# Patient Record
Sex: Female | Born: 1990 | Race: White | Hispanic: No | Marital: Married | Smoking: Never smoker
Health system: Southern US, Community
[De-identification: ages and names within clinical notes are randomized; demographics above are authoritative.]

## PROBLEM LIST (undated history)

## (undated) DIAGNOSIS — F988 Other specified behavioral and emotional disorders with onset usually occurring in childhood and adolescence: Secondary | ICD-10-CM

## (undated) DIAGNOSIS — E559 Vitamin D deficiency, unspecified: Secondary | ICD-10-CM

## (undated) HISTORY — PX: BREAST SURGERY: SHX581

## (undated) HISTORY — DX: Vitamin D deficiency, unspecified: E55.9

## (undated) HISTORY — DX: Other specified behavioral and emotional disorders with onset usually occurring in childhood and adolescence: F98.8

---
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
## 2009-04-19 ENCOUNTER — Emergency Department (HOSPITAL_COMMUNITY): Admission: EM | Admit: 2009-04-19 | Discharge: 2009-04-19 | Payer: Self-pay | Admitting: Emergency Medicine

## 2010-02-14 ENCOUNTER — Ambulatory Visit: Payer: Self-pay | Admitting: Internal Medicine

## 2010-08-30 ENCOUNTER — Ambulatory Visit: Payer: Self-pay | Admitting: Internal Medicine

## 2011-03-13 LAB — POCT PREGNANCY, URINE: Preg Test, Ur: NEGATIVE

## 2011-03-19 LAB — POCT PREGNANCY, URINE: Preg Test, Ur: NEGATIVE

## 2011-03-29 ENCOUNTER — Other Ambulatory Visit: Payer: Self-pay

## 2011-04-16 ENCOUNTER — Ambulatory Visit: Payer: Self-pay | Admitting: General Surgery

## 2011-08-29 LAB — POCT PREGNANCY, URINE: Preg Test, Ur: NEGATIVE

## 2011-08-29 LAB — URINALYSIS, ROUTINE W REFLEX MICROSCOPIC
Bilirubin Urine: NEGATIVE
Nitrite: NEGATIVE
Specific Gravity, Urine: 1.016
Urobilinogen, UA: 0.2

## 2011-08-29 LAB — URINE MICROSCOPIC-ADD ON

## 2011-08-29 LAB — URINE CULTURE: Colony Count: >=100000

## 2012-01-16 ENCOUNTER — Ambulatory Visit: Payer: Self-pay

## 2013-02-02 ENCOUNTER — Ambulatory Visit: Payer: Self-pay | Admitting: Internal Medicine

## 2017-04-03 ENCOUNTER — Other Ambulatory Visit: Payer: Self-pay | Admitting: Obstetrics and Gynecology

## 2017-04-03 DIAGNOSIS — N63 Unspecified lump in unspecified breast: Secondary | ICD-10-CM

## 2017-05-03 ENCOUNTER — Ambulatory Visit
Admission: RE | Admit: 2017-05-03 | Discharge: 2017-05-03 | Disposition: A | Discharge status: Home / Self Care | Payer: BC Managed Care – PPO | Source: Ambulatory Visit | Attending: Obstetrics and Gynecology | Admitting: Obstetrics and Gynecology

## 2017-05-03 DIAGNOSIS — N6310 Unspecified lump in the right breast, unspecified quadrant: Secondary | ICD-10-CM | POA: Insufficient documentation

## 2017-05-03 DIAGNOSIS — N63 Unspecified lump in unspecified breast: Secondary | ICD-10-CM

## 2017-12-03 NOTE — L&D Delivery Note (Signed)
Delivery Note:  [id number] Dr Logan BoresEvans in room for bedside evaluation. Recommended pitocin augmentation or vacuum assisted birth.   Effective maternal pushing efforts noted prior to intervention and pushing resumed. Local anesthesia to perineum in preparation for possible episiotomy. Episiotomy deferred.   Spontaneous vaginal delivery of liveborn female patient in left occiput anterior position at 0145. Infant immediately to maternal abdomen. Delayed cord clamping. Three (3) vessel cord, cord blood collected. APGARs: 7, 9. Weight: pending. Receiving nurse and NNP present at bedside for birth.   Pitocin infusing. Spontaneous delivery of intact placenta at 0151. Second degree perineal laceration repaired with 3-0 Vicryl Rapide under local anesthesia, hemostatic and well approximated. Left labial laceration, hemostatic unrepaired. QBL: 328 ml. Vault check completed. Counts correct x 2.   Initiate routine postpartum care and orders. Mom to postpartum.  Baby to Couplet care / Skin to Skin.  FOB and family members present at bedside and overjoyed with the birth of "Tobi Bastosnna".    Gunnar BullaJenkins Michelle Flor Houdeshell, CNM [facility], [facility] 07/15/2018, 2:25 AM

## 2017-12-04 ENCOUNTER — Other Ambulatory Visit: Payer: Self-pay

## 2017-12-04 DIAGNOSIS — B3731 Acute candidiasis of vulva and vagina: Secondary | ICD-10-CM

## 2017-12-04 DIAGNOSIS — B373 Candidiasis of vulva and vagina: Secondary | ICD-10-CM | POA: Insufficient documentation

## 2017-12-04 DIAGNOSIS — N926 Irregular menstruation, unspecified: Secondary | ICD-10-CM

## 2017-12-04 DIAGNOSIS — R300 Dysuria: Secondary | ICD-10-CM | POA: Insufficient documentation

## 2017-12-04 DIAGNOSIS — F901 Attention-deficit hyperactivity disorder, predominantly hyperactive type: Secondary | ICD-10-CM

## 2017-12-05 ENCOUNTER — Other Ambulatory Visit: Payer: Self-pay | Admitting: Nurse Practitioner

## 2017-12-09 ENCOUNTER — Ambulatory Visit: Payer: BC Managed Care – PPO | Admitting: Certified Nurse Midwife

## 2017-12-09 ENCOUNTER — Encounter: Payer: Self-pay | Admitting: Certified Nurse Midwife

## 2017-12-09 VITALS — BP 109/70 | HR 95 | Ht 60.0 in | Wt 118.5 lb

## 2017-12-09 DIAGNOSIS — N912 Amenorrhea, unspecified: Secondary | ICD-10-CM | POA: Diagnosis not present

## 2017-12-09 DIAGNOSIS — N926 Irregular menstruation, unspecified: Secondary | ICD-10-CM | POA: Diagnosis not present

## 2017-12-09 DIAGNOSIS — Z3687 Encounter for antenatal screening for uncertain dates: Secondary | ICD-10-CM | POA: Diagnosis not present

## 2017-12-09 LAB — POCT URINE PREGNANCY: Preg Test, Ur: POSITIVE — AB

## 2017-12-09 NOTE — Patient Instructions (Signed)
WHAT OB PATIENTS CAN EXPECT   Confirmation of pregnancy and ultrasound ordered if medically indicated-[redacted] weeks gestation  New OB (NOB) intake with nurse and New OB (NOB) labs- [redacted] weeks gestation  New OB (NOB) physical examination with provider- 11/[redacted] weeks gestation  Flu vaccine-[redacted] weeks gestation  Anatomy scan-[redacted] weeks gestation  Glucose tolerance test, blood work to test for anemia, T-dap vaccine-[redacted] weeks gestation  Vaginal swabs/cultures-STD/Group B strep-[redacted] weeks gestation  Appointments every 4 weeks until 28 weeks  Every 2 weeks from 28 weeks until 36 weeks  Weekly visits from 36 weeks until delivery  Eating Plan for Pregnant Women While you are pregnant, your body will require additional nutrition to help support your growing baby. It is recommended that you consume:  150 additional calories each day during your first trimester.  300 additional calories each day during your second trimester.  300 additional calories each day during your third trimester.  Eating a healthy, well-balanced diet is very important for your health and for your baby's health. You also have a higher need for some vitamins and minerals, such as folic acid, calcium, iron, and vitamin D. What do I need to know about eating during pregnancy?  Do not try to lose weight or go on a diet during pregnancy.  Choose healthy, nutritious foods. Choose  of a sandwich with a glass of milk instead of a candy bar or a high-calorie sugar-sweetened beverage.  Limit your overall intake of foods that have "empty calories." These are foods that have little nutritional value, such as sweets, desserts, candies, sugar-sweetened beverages, and fried foods.  Eat a variety of foods, especially fruits and vegetables.  Take a prenatal vitamin to help meet the additional needs during pregnancy, specifically for folic acid, iron, calcium, and vitamin D.  Remember to stay active. Ask your health care provider for exercise  recommendations that are specific to you.  Practice good food safety and cleanliness, such as washing your hands before you eat and after you prepare raw meat. This helps to prevent foodborne illnesses, such as listeriosis, that can be very dangerous for your baby. Ask your health care provider for more information about listeriosis. What does 150 extra calories look like? Healthy options for an additional 150 calories each day could be any of the following:  Plain low-fat yogurt (6-8 oz) with  cup of berries.  1 apple with 2 teaspoons of peanut butter.  Cut-up vegetables with  cup of hummus.  Low-fat chocolate milk (8 oz or 1 cup).  1 string cheese with 1 medium orange.   of a peanut butter and jelly sandwich on whole-wheat bread (1 tsp of peanut butter).  For 300 calories, you could eat two of those healthy options each day. What is a healthy amount of weight to gain? The recommended amount of weight for you to gain is based on your pre-pregnancy BMI. If your pre-pregnancy BMI was:  Less than 18 (underweight), you should gain 28-40 lb.  18-24.9 (normal), you should gain 25-35 lb.  25-29.9 (overweight), you should gain 15-25 lb.  Greater than 30 (obese), you should gain 11-20 lb.  What if I am having twins or multiples? Generally, pregnant women who will be having twins or multiples may need to increase their daily calories by 300-600 calories each day. The recommended range for total weight gain is 25-54 lb, depending on your pre-pregnancy BMI. Talk with your health care provider for specific guidance about additional nutritional needs, weight gain, and exercise during  your pregnancy. What foods can I eat? Grains Any grains. Try to choose whole grains, such as whole-wheat bread, oatmeal, or brown rice. Vegetables Any vegetables. Try to eat a variety of colors and types of vegetables to get a full range of vitamins and minerals. Remember to wash your vegetables well before  eating. Fruits Any fruits. Try to eat a variety of colors and types of fruit to get a full range of vitamins and minerals. Remember to wash your fruits well before eating. Meats and Other Protein Sources Lean meats, including chicken, [place], fish, and lean cuts of beef, veal, or pork. Make sure that all meats are cooked to "well done." Tofu. Tempeh. Beans. Eggs. Peanut butter and other nut butters. Seafood, such as shrimp, crab, and lobster. If you choose fish, select types that are higher in omega-3 fatty acids, including salmon, herring, mussels, trout, sardines, and pollock. Make sure that all meats are cooked to food-safe temperatures. Dairy Pasteurized milk and milk alternatives. Pasteurized yogurt and pasteurized cheese. Cottage cheese. Sour cream. Beverages Water. Juices that contain 100% fruit juice or vegetable juice. Caffeine-free teas and decaffeinated coffee. Drinks that contain caffeine are okay to drink, but it is better to avoid caffeine. Keep your total caffeine intake to less than 200 mg each day (12 oz of coffee, tea, or soda) or as directed by your health care provider. Condiments Any pasteurized condiments. Sweets and Desserts Any sweets and desserts. Fats and Oils Any fats and oils. The items listed above may not be a complete list of recommended foods or beverages. Contact your dietitian for more options. What foods are not recommended? Vegetables Unpasteurized (raw) vegetable juices. Fruits Unpasteurized (raw) fruit juices. Meats and Other Protein Sources Cured meats that have nitrates, such as bacon, salami, and hotdogs. Luncheon meats, bologna, or other deli meats (unless they are reheated until they are steaming hot). Refrigerated pate, meat spreads from a meat counter, smoked seafood that is found in the refrigerated section of a store. Raw fish, such as sushi or sashimi. High mercury content fish, such as tilefish, shark, swordfish, and king mackerel. Raw meats,  such as tuna or beef tartare. Undercooked meats and poultry. Make sure that all meats are cooked to food-safe temperatures. Dairy Unpasteurized (raw) milk and any foods that have raw milk in them. Soft cheeses, such as feta, queso blanco, queso fresco, Brie, Camembert cheeses, blue-veined cheeses, and Panela cheese (unless it is made with pasteurized milk, which must be stated on the label). Beverages Alcohol. Sugar-sweetened beverages, such as sodas, teas, or energy drinks. Condiments Homemade fermented foods and drinks, such as pickles, sauerkraut, or kombucha drinks. (Store-bought pasteurized versions of these are okay.) Other Salads that are made in the store, such as ham salad, chicken salad, egg salad, tuna salad, and seafood salad. The items listed above may not be a complete list of foods and beverages to avoid. Contact your dietitian for more information. This information is not intended to replace advice given to you by your health care provider. Make sure you discuss any questions you have with your health care provider. Document Released: 09/03/2014 Document Revised: 04/26/2016 Document Reviewed: 05/04/2014 Elsevier Interactive Patient Education  2018 [place]. Back Pain in Pregnancy Back pain during pregnancy is common. Back pain may be caused by several factors that are related to changes during your pregnancy. Follow these instructions at home: Managing pain, stiffness, and swelling  If directed, apply ice for sudden (acute) back pain. ? Put ice in a plastic  bag. ? Place a towel between your skin and the bag. ? Leave the ice on for 20 minutes, 2-3 times per day.  If directed, apply heat to the affected area before you exercise: ? Place a towel between your skin and the heat pack or heating pad. ? Leave the heat on for 20-30 minutes. ? Remove the heat if your skin turns bright red. This is especially important if you are unable to feel pain, heat, or cold. You may have a  greater risk of getting burned. Activity  Exercise as told by your health care provider. Exercising is the best way to prevent or manage back pain.  Listen to your body when lifting. If lifting hurts, ask for help or bend your knees. This uses your leg muscles instead of your back muscles.  Squat down when picking up something from the floor. Do not bend over.  Only use bed rest as told by your health care provider. Bed rest should only be used for the most severe episodes of back pain. Standing, Sitting, and Lying Down  Do not stand in one place for long periods of time.  Use good posture when sitting. Make sure your head rests over your shoulders and is not hanging forward. Use a pillow on your lower back if necessary.  Try sleeping on your side, preferably the left side, with a pillow or two between your legs. If you are sore after a night's rest, your bed may be too soft. A firm mattress may provide more support for your back during pregnancy. General instructions  Do not wear high heels.  Eat a healthy diet. Try to gain weight within your health care provider's recommendations.  Use a maternity girdle, elastic sling, or back brace as told by your health care provider.  Take over-the-counter and prescription medicines only as told by your health care provider.  Keep all follow-up visits as told by your health care provider. This is important. This includes any visits with any specialists, such as a physical therapist. Contact a health care provider if:  Your back pain interferes with your daily activities.  You have increasing pain in other parts of your body. Get help right away if:  You develop numbness, tingling, weakness, or problems with the use of your arms or legs.  You develop severe back pain that is not controlled with medicine.  You have a sudden change in bowel or bladder control.  You develop shortness of breath, dizziness, or you faint.  You develop  nausea, vomiting, or sweating.  You have back pain that is a rhythmic, cramping pain similar to labor pains. Labor pain is usually 1-2 minutes apart, lasts for about 1 minute, and involves a bearing down feeling or pressure in your pelvis.  You have back pain and your water breaks or you have vaginal bleeding.  You have back pain or numbness that travels down your leg.  Your back pain developed after you fell.  You develop pain on one side of your back.  You see blood in your urine.  You develop skin blisters in the area of your back pain. This information is not intended to replace advice given to you by your health care provider. Make sure you discuss any questions you have with your health care provider. Document Released: 02/27/2006 Document Revised: 04/26/2016 Document Reviewed: 08/03/2015 Elsevier Interactive Patient Education  2018 [place]. Abdominal Pain During Pregnancy Belly (abdominal) pain is common during pregnancy. Most of the time, it  is not a serious problem. Other times, it can be a sign that something is wrong with the pregnancy. Always tell your doctor if you have belly pain. Follow these instructions at home: Monitor your belly pain for any changes. The following actions may help you feel better:  Do not have sex (intercourse) or put anything in your vagina until you feel better.  Rest until your pain stops.  Drink clear fluids if you feel sick to your stomach (nauseous). Do not eat solid food until you feel better.  Only take medicine as told by your doctor.  Keep all doctor visits as told.  Get help right away if:  You are bleeding, leaking fluid, or pieces of tissue come out of your vagina.  You have more pain or cramping.  You keep throwing up (vomiting).  You have pain when you pee (urinate) or have blood in your pee.  You have a fever.  You do not feel your baby moving as much.  You feel very weak or feel like passing out.  You have  trouble breathing, with or without belly pain.  You have a very bad headache and belly pain.  You have fluid leaking from your vagina and belly pain.  You keep having watery poop (diarrhea).  Your belly pain does not go away after resting, or the pain gets worse. This information is not intended to replace advice given to you by your health care provider. Make sure you discuss any questions you have with your health care provider. Document Released: 11/07/2009 Document Revised: 06/27/2016 Document Reviewed: 06/18/2013 Elsevier Interactive Patient Education  2018 [employer]. Morning Sickness Morning sickness is when you feel sick to your stomach (nauseous) during pregnancy. You may feel sick to your stomach and throw up (vomit). You may feel sick in the morning, but you can feel this way any time of day. Some women feel very sick to their stomach and cannot stop throwing up (hyperemesis gravidarum). Follow these instructions at home:  Only take medicines as told by your doctor.  Take multivitamins as told by your doctor. Taking multivitamins before getting pregnant can stop or lessen the harshness of morning sickness.  Eat dry toast or unsalted crackers before getting out of bed.  Eat 5 to 6 small meals a day.  Eat dry and bland foods like rice and baked potatoes.  Do not drink liquids with meals. Drink between meals.  Do not eat greasy, fatty, or spicy foods.  Have someone cook for you if the smell of food causes you to feel sick or throw up.  If you feel sick to your stomach after taking prenatal vitamins, take them at night or with a snack.  Eat protein when you need a snack (nuts, yogurt, cheese).  Eat unsweetened gelatins for dessert.  Wear a bracelet used for sea sickness (acupressure wristband).  Go to a doctor that puts thin needles into certain body points (acupuncture) to improve how you feel.  Do not smoke.  Use a humidifier to keep the air in your house free  of odors.  Get lots of fresh air. Contact a doctor if:  You need medicine to feel better.  You feel dizzy or lightheaded.  You are losing weight. Get help right away if:  You feel very sick to your stomach and cannot stop throwing up.  You pass out (faint). This information is not intended to replace advice given to you by your health care provider. Make sure you discuss  any questions you have with your health care provider. Document Released: 12/27/2004 Document Revised: 04/26/2016 Document Reviewed: 05/06/2013 Elsevier Interactive Patient Education  2017 [place]. Common Medications Safe in Pregnancy  Acne:      Constipation:  Benzoyl Peroxide     Colace  Clindamycin      Dulcolax Suppository  Topica Erythromycin     Fibercon  Salicylic Acid      Metamucil         Miralax AVOID:        Senakot   Accutane    Cough:  Retin-A       Cough Drops  Tetracycline      Phenergan w/ Codeine if Rx  Minocycline      Robitussin (Plain & DM)  Antibiotics:     Crabs/Lice:  Ceclor       RID  Cephalosporins    AVOID:  E-Mycins      Kwell  Keflex  Macrobid/Macrodantin   Diarrhea:  Penicillin      Kao-Pectate  Zithromax      Imodium AD         PUSH FLUIDS AVOID:       Cipro     Fever:  Tetracycline      Tylenol (Regular or Extra  Minocycline       Strength)  Levaquin      Extra Strength-Do not          Exceed 8 tabs/24 hrs Caffeine:        <229m/day (equiv. To 1 cup of coffee or  approx. 3 12 oz sodas)         Gas: Cold/Hayfever:       Gas-X  Benadryl      Mylicon  Claritin       Phazyme  **Claritin-D        Chlor-Trimeton    Headaches:  Dimetapp      ASA-Free Excedrin  Drixoral-Non-Drowsy     Cold Compress  Mucinex (Guaifenasin)     Tylenol (Regular or Extra  Sudafed/Sudafed-12 Hour     Strength)  **Sudafed PE Pseudoephedrine   Tylenol Cold & Sinus     Vicks Vapor Rub  Zyrtec  **AVOID if Problems With Blood Pressure         Heartburn: Avoid lying down for  at least 1 hour after meals  Aciphex      Maalox     Rash:  Milk of Magnesia     Benadryl    Mylanta       1% Hydrocortisone Cream  Pepcid  Pepcid Complete   Sleep Aids:  Prevacid      Ambien   Prilosec       Benadryl  Rolaids       Chamomile Tea  Tums (Limit 4/day)     Unisom  Zantac       Tylenol PM         Warm milk-add vanilla or  Hemorrhoids:       Sugar for taste  Anusol/Anusol H.C.  (RX: Analapram 2.5%)  Sugar Substitutes:  Hydrocortisone OTC     Ok in moderation  Preparation H      Tucks        Vaseline lotion applied to tissue with wiping    Herpes:     Throat:  Acyclovir      Oragel  Famvir  Valtrex     Vaccines:         Flu Shot Leg Cramps:       *  Gardasil  Benadryl      Hepatitis A         Hepatitis B Nasal Spray:       Pneumovax  Saline Nasal Spray     Polio Booster         Tetanus Nausea:       Tuberculosis test or PPD  Vitamin B6 25 mg TID   AVOID:    Dramamine      *Gardasil  Emetrol       Live Poliovirus  Ginger Root 250 mg QID    MMR (measles, mumps &  High Complex Carbs @ Bedtime    rebella)  Sea Bands-Accupressure    Varicella (Chickenpox)  Unisom 1/2 tab TID     *No known complications           If received before Pain:         Known pregnancy;   Darvocet       Resume series after  Lortab        Delivery  Percocet    Yeast:   Tramadol      Femstat  Tylenol 3      Gyne-lotrimin  Ultram       Monistat  Vicodin           MISC:         All Sunscreens           Hair Coloring/highlights          Insect Repellant's          (Including DEET)         Mystic Tans First Trimester of Pregnancy The first trimester of pregnancy is from week 1 until the end of week 13 (months 1 through 3). During this time, your baby will begin to develop inside you. At 6-8 weeks, the eyes and face are formed, and the heartbeat can be seen on ultrasound. At the end of 12 weeks, all the baby's organs are formed. Prenatal care is all the medical care you receive before the  birth of your baby. Make sure you get good prenatal care and follow all of your doctor's instructions. Follow these instructions at home: Medicines  Take over-the-counter and prescription medicines only as told by your doctor. Some medicines are safe and some medicines are not safe during pregnancy.  Take a prenatal vitamin that contains at least 600 micrograms (mcg) of folic acid.  If you have trouble pooping (constipation), take medicine that will make your stool soft (stool softener) if your doctor approves. Eating and drinking  Eat regular, healthy meals.  Your doctor will tell you the amount of weight gain that is right for you.  Avoid raw meat and uncooked cheese.  If you feel sick to your stomach (nauseous) or throw up (vomit): ? Eat 4 or 5 small meals a day instead of 3 large meals. ? Try eating a few soda crackers. ? Drink liquids between meals instead of during meals.  To prevent constipation: ? Eat foods that are high in fiber, like fresh fruits and vegetables, whole grains, and beans. ? Drink enough fluids to keep your pee (urine) clear or pale yellow. Activity  Exercise only as told by your doctor. Stop exercising if you have cramps or pain in your lower belly (abdomen) or low back.  Do not exercise if it is too hot, too humid, or if you are in a place of great height (high altitude).  Try to avoid standing for long periods  of time. Move your legs often if you must stand in one place for a long time.  Avoid heavy lifting.  Wear low-heeled shoes. Sit and stand up straight.  You can have sex unless your doctor tells you not to. Relieving pain and discomfort  Wear a good support bra if your breasts are sore.  Take warm water baths (sitz baths) to soothe pain or discomfort caused by hemorrhoids. Use hemorrhoid cream if your doctor says it is okay.  Rest with your legs raised if you have leg cramps or low back pain.  If you have puffy, bulging veins (varicose  veins) in your legs: ? Wear support hose or compression stockings as told by your doctor. ? Raise (elevate) your feet for 15 minutes, 3-4 times a day. ? Limit salt in your food. Prenatal care  Schedule your prenatal visits by the twelfth week of pregnancy.  Write down your questions. Take them to your prenatal visits.  Keep all your prenatal visits as told by your doctor. This is important. Safety  Wear your seat belt at all times when driving.  Make a list of emergency phone numbers. The list should include numbers for family, friends, the hospital, and police and fire departments. General instructions  Ask your doctor for a referral to a local prenatal class. Begin classes no later than at the start of month 6 of your pregnancy.  Ask for help if you need counseling or if you need help with nutrition. Your doctor can give you advice or tell you where to go for help.  Do not use hot tubs, steam rooms, or saunas.  Do not douche or use tampons or scented sanitary pads.  Do not cross your legs for long periods of time.  Avoid all herbs and alcohol. Avoid drugs that are not approved by your doctor.  Do not use any tobacco products, including cigarettes, chewing tobacco, and electronic cigarettes. If you need help quitting, ask your doctor. You may get counseling or other support to help you quit.  Avoid cat litter boxes and soil used by cats. These carry germs that can cause birth defects in the baby and can cause a loss of your baby (miscarriage) or stillbirth.  Visit your dentist. At home, brush your teeth with a soft toothbrush. Be gentle when you floss. Contact a doctor if:  You are dizzy.  You have mild cramps or pressure in your lower belly.  You have a nagging pain in your belly area.  You continue to feel sick to your stomach, you throw up, or you have watery poop (diarrhea).  You have a bad smelling fluid coming from your vagina.  You have pain when you pee  (urinate).  You have increased puffiness (swelling) in your face, hands, legs, or ankles. Get help right away if:  You have a fever.  You are leaking fluid from your vagina.  You have spotting or bleeding from your vagina.  You have very bad belly cramping or pain.  You gain or lose weight rapidly.  You throw up blood. It may look like coffee grounds.  You are around people who have [place] measles, fifth disease, or chickenpox.  You have a very bad headache.  You have shortness of breath.  You have any kind of trauma, such as from a fall or a car accident. Summary  The first trimester of pregnancy is from week 1 until the end of week 13 (months 1 through 3).  To take care  of yourself and your unborn baby, you will need to eat healthy meals, take medicines only if your doctor tells you to do so, and do activities that are safe for you and your baby.  Keep all follow-up visits as told by your doctor. This is important as your doctor will have to ensure that your baby is healthy and growing well. This information is not intended to replace advice given to you by your health care provider. Make sure you discuss any questions you have with your health care provider. Document Released: 05/07/2008 Document Revised: 11/27/2016 Document Reviewed: 11/27/2016 Elsevier Interactive Patient Education  2017 [employer].

## 2017-12-09 NOTE — Progress Notes (Signed)
GYN ENCOUNTER NOTE  Subjective:       Tina Rodgers is a 27 y.o. G1P0 female here for pregnancy confirmation.   This is a planned pregnancy. Endorses four (4) positive home pregnancy test and intermittent breast tenderness. Reports last period was one (1) week late, but lasted for one (1) week. "Tracker app" erased everything, so date is approximate.   Denies difficulty breathing or respiratory distress, chest pain, abdominal pain, vaginal bleeding, dysuria, and leg pain or swelling.    Gynecologic History  Patient's last menstrual period was 11/08/2017 (approximate).  Estimated date of birth: 08/15/2019.   Gestational age: 96 weeks 3 days.   Obstetric History  OB History  Gravida Para Term Preterm AB Living  1            SAB TAB Ectopic Multiple Live Births               # Outcome Date GA Lbr Len/2nd Weight Sex Delivery Anes PTL Lv  1 Current               Past Medical History:  Diagnosis Date  . ADD (attention deficit disorder)   . Vitamin D deficiency     Current Outpatient Medications on File Prior to Visit  Medication Sig Dispense Refill  . methylphenidate (RITALIN) 10 MG tablet Take 10 mg by mouth 2 (two) times daily.     No current facility-administered medications on file prior to visit.     Allergies  Allergen Reactions  . Sulfa Antibiotics Other (See Comments)    flush    Social History   Socioeconomic History  . Marital status: Married    Spouse name: Not on file  . Number of children: Not on file  . Years of education: Not on file  . Highest education level: Not on file  Social Needs  . Financial resource strain: Not on file  . Food insecurity - worry: Not on file  . Food insecurity - inability: Not on file  . Transportation needs - medical: Not on file  . Transportation needs - non-medical: Not on file  Occupational History  . Not on file  Tobacco Use  . Smoking status: Never Smoker  . Smokeless tobacco: Never Used  Substance  and Sexual Activity  . Alcohol use: Yes    Frequency: Never    Comment: 3-4 x week  . Drug use: No  . Sexual activity: Yes    Birth control/protection: None  Other Topics Concern  . Not on file  Social History Narrative  . Not on file    Family History  Problem Relation Age of Onset  . Ovarian cancer Paternal Grandmother   . Breast cancer Neg Hx   . Colon cancer Neg Hx   . Diabetes Neg Hx     The following portions of the patient's history were reviewed and updated as appropriate: allergies, current medications, past family history, past medical history, past social history, past surgical history and problem list.  Review of Systems  Review of Systems - Negative except as noted above. History obtained from the patient.   Objective:   BP 109/70   Pulse 95   Ht 5' (1.524 m)   Wt 118 lb 8 oz (53.8 kg)   LMP 11/08/2017 (Approximate)   BMI 23.14 kg/m   Alert and oriented x 4, no apparent distress.   Physical exam: not indicated.   UPT positive  Assessment:   1. Amenorrhea  - POCT urine  pregnancy  Plan:   Encouraged prenatal vitamin with folic acid and DHA. Samples given.   Discussed food safety, weight gain, and pregnancy safe medications. Handouts given.   Reviewed red flag symptoms and when to call.   RTC x 2-3 weeks for dating/viability [place] and nurse intake or sooner if needed.    Gunnar Bulla, CNM [facility]   A total of 20 minutes were spent face-to-face with the patient during the encounter with greater than 50% dealing with counseling and coordination of care.

## 2017-12-16 ENCOUNTER — Telehealth: Payer: Self-pay | Admitting: Certified Nurse Midwife

## 2017-12-16 MED ORDER — VITAFOL ULTRA 29-0.6-0.4-200 MG PO CAPS: 29.0000 mg | ORAL_CAPSULE | Freq: Every day | ORAL | 11 refills | Status: DC

## 2017-12-16 NOTE — Telephone Encounter (Signed)
The patient would like to have a nurse call her in regards to questions and concerns she is having. And the patient would also like to have a script of prenatal vitamins sent in. No other information was disclosed. Please advise.

## 2017-12-16 NOTE — Telephone Encounter (Signed)
[place] [employer] [place]. Advised to keep her mouth/nose covered. Get a breath of fresh hour every 2 hours. Per her request vitafol ultra erx.

## 2017-12-18 ENCOUNTER — Encounter: Payer: Self-pay | Admitting: Obstetrics and Gynecology

## 2018-01-03 ENCOUNTER — Other Ambulatory Visit: Payer: BC Managed Care – PPO

## 2018-01-06 ENCOUNTER — Ambulatory Visit (INDEPENDENT_AMBULATORY_CARE_PROVIDER_SITE_OTHER): Payer: BC Managed Care – PPO

## 2018-01-06 ENCOUNTER — Ambulatory Visit: Payer: BC Managed Care – PPO | Admitting: Certified Nurse Midwife

## 2018-01-06 VITALS — BP 113/57 | HR 91 | Wt 118.1 lb

## 2018-01-06 DIAGNOSIS — N912 Amenorrhea, unspecified: Secondary | ICD-10-CM | POA: Diagnosis not present

## 2018-01-06 DIAGNOSIS — N926 Irregular menstruation, unspecified: Secondary | ICD-10-CM

## 2018-01-06 DIAGNOSIS — Z3687 Encounter for antenatal screening for uncertain dates: Secondary | ICD-10-CM | POA: Diagnosis not present

## 2018-01-06 DIAGNOSIS — Z3401 Encounter for supervision of normal first pregnancy, first trimester: Secondary | ICD-10-CM

## 2018-01-06 NOTE — Progress Notes (Signed)
I have reviewed the record and concur with patient management and plan of care.    Jenkins Michelle Lawhorn, CNM [facility] 

## 2018-01-06 NOTE — Progress Notes (Signed)
Tina ChyleKathryn Birchett Rodgers presents for NOB nurse interview visit. Pregnancy confirmation done _[facility] 1/7/19_____.  G-1 .  P-0    . Pregnancy education material explained and given. _1__ cats in the home. NOB labs ordered. HIV labs and Drug screen were explained optional and she did not decline. Drug screen ordered. PNV encouraged. Genetic screening options discussed. Genetic testing: Ordered/Declined/Unsure.  Pt may discuss with provider. Pt. To follow up with provider in 2__ weeks for NOB physical.  All questions answered.

## 2018-01-06 NOTE — Addendum Note (Signed)
Addended by: Brooke DareSICK, Amitai Delaughter L on: 01/06/2018 03:25 PM   Modules accepted: Orders

## 2018-01-07 LAB — URINALYSIS, ROUTINE W REFLEX MICROSCOPIC
Bilirubin, UA: NEGATIVE
Glucose, UA: NEGATIVE
Ketones, UA: NEGATIVE
Nitrite, UA: NEGATIVE
PH UA: 6.5 (ref 5.0–7.5)
PROTEIN UA: NEGATIVE
RBC, UA: NEGATIVE
Specific Gravity, UA: 1.017 (ref 1.005–1.030)
Urobilinogen, Ur: 1 mg/dL (ref 0.2–1.0)

## 2018-01-07 LAB — CBC WITH DIFFERENTIAL
BASOS ABS: 0 10*3/uL (ref 0.0–0.2)
Basos: 0 %
EOS (ABSOLUTE): 0.1 10*3/uL (ref 0.0–0.4)
EOS: 1 %
HEMATOCRIT: 40.5 % (ref 34.0–46.6)
Hemoglobin: 13.5 g/dL (ref 11.1–15.9)
IMMATURE GRANULOCYTES: 0 %
Immature Grans (Abs): 0 10*3/uL (ref 0.0–0.1)
LYMPHS ABS: 3.1 10*3/uL (ref 0.7–3.1)
Lymphs: 23 %
MCH: 31 pg (ref 26.6–33.0)
MCHC: 33.3 g/dL (ref 31.5–35.7)
MCV: 93 fL (ref 79–97)
MONOCYTES: 6 %
MONOS ABS: 0.8 10*3/uL (ref 0.1–0.9)
Neutrophils Absolute: 9.4 10*3/uL — ABNORMAL HIGH (ref 1.4–7.0)
Neutrophils: 70 %
RBC: 4.36 x10E6/uL (ref 3.77–5.28)
RDW: 12.8 % (ref 12.3–15.4)
WBC: 13.5 10*3/uL — AB (ref 3.4–10.8)

## 2018-01-07 LAB — RUBELLA SCREEN: Rubella Antibodies, IGG: 1.88 index (ref >0.99)

## 2018-01-07 LAB — ABO AND RH: RH TYPE: NEGATIVE

## 2018-01-07 LAB — RPR: RPR: NONREACTIVE

## 2018-01-07 LAB — MICROSCOPIC EXAMINATION: CASTS: NONE SEEN /LPF

## 2018-01-07 LAB — VARICELLA ZOSTER ANTIBODY, IGG: Varicella zoster IgG: 2185 index (ref >165)

## 2018-01-07 LAB — HIV ANTIBODY (ROUTINE TESTING W REFLEX): HIV SCREEN 4TH GENERATION: NONREACTIVE

## 2018-01-07 LAB — GC/CHLAMYDIA PROBE AMP
Chlamydia trachomatis, NAA: NEGATIVE
NEISSERIA GONORRHOEAE BY PCR: NEGATIVE

## 2018-01-07 LAB — ANTIBODY SCREEN: ANTIBODY SCREEN: NEGATIVE

## 2018-01-07 LAB — HEPATITIS B SURFACE ANTIGEN: Hepatitis B Surface Ag: NEGATIVE

## 2018-01-08 LAB — MONITOR DRUG PROFILE 14(MW)
Amphetamine Scrn, Ur: NEGATIVE ng/mL
BARBITURATE SCREEN URINE: NEGATIVE ng/mL
BENZODIAZEPINE SCREEN, URINE: NEGATIVE ng/mL
BUPRENORPHINE, URINE: NEGATIVE ng/mL
CANNABINOIDS UR QL SCN: NEGATIVE ng/mL
CREATININE(CRT), U: 96.3 mg/dL (ref 20.0–300.0)
Cocaine (Metab) Scrn, Ur: NEGATIVE ng/mL
FENTANYL, URINE: NEGATIVE pg/mL
METHADONE SCREEN, URINE: NEGATIVE ng/mL
Meperidine Screen, Urine: NEGATIVE ng/mL
OXYCODONE+OXYMORPHONE UR QL SCN: NEGATIVE ng/mL
Opiate Scrn, Ur: NEGATIVE ng/mL
PH UR, DRUG SCRN: 6.4 (ref 4.5–8.9)
PHENCYCLIDINE QUANTITATIVE URINE: NEGATIVE ng/mL
Propoxyphene Scrn, Ur: NEGATIVE ng/mL
SPECIFIC GRAVITY: 1.015
Tramadol Screen, Urine: NEGATIVE ng/mL

## 2018-01-08 LAB — NICOTINE SCREEN, URINE: COTININE UR QL SCN: NEGATIVE ng/mL

## 2018-01-08 LAB — URINE CULTURE: Organism ID, Bacteria: NO GROWTH

## 2018-01-10 ENCOUNTER — Encounter: Payer: Self-pay | Admitting: Certified Nurse Midwife

## 2018-01-10 DIAGNOSIS — O26899 Other specified pregnancy related conditions, unspecified trimester: Secondary | ICD-10-CM | POA: Insufficient documentation

## 2018-01-10 DIAGNOSIS — Z6791 Unspecified blood type, Rh negative: Secondary | ICD-10-CM | POA: Insufficient documentation

## 2018-01-10 LAB — MATERNIT 21 PLUS CORE, BLOOD
Chromosome 13: NEGATIVE
Chromosome 18: NEGATIVE
Chromosome 21: NEGATIVE
Y CHROMOSOME: NOT DETECTED

## 2018-01-14 ENCOUNTER — Telehealth: Payer: Self-pay

## 2018-01-14 NOTE — Telephone Encounter (Signed)
Voicemail message left. Requested a call back if pt would like to know gender.

## 2018-01-14 NOTE — Telephone Encounter (Signed)
Pt returned my call- gender put in envelope and put up front per pt.. Reminded to not look on mychart.

## 2018-01-27 ENCOUNTER — Ambulatory Visit (INDEPENDENT_AMBULATORY_CARE_PROVIDER_SITE_OTHER): Payer: BC Managed Care – PPO | Admitting: Certified Nurse Midwife

## 2018-01-27 VITALS — BP 97/60 | HR 101 | Wt 117.8 lb

## 2018-01-27 DIAGNOSIS — O26899 Other specified pregnancy related conditions, unspecified trimester: Secondary | ICD-10-CM

## 2018-01-27 DIAGNOSIS — O09899 Supervision of other high risk pregnancies, unspecified trimester: Secondary | ICD-10-CM

## 2018-01-27 DIAGNOSIS — Z3401 Encounter for supervision of normal first pregnancy, first trimester: Secondary | ICD-10-CM

## 2018-01-27 DIAGNOSIS — Z6791 Unspecified blood type, Rh negative: Secondary | ICD-10-CM

## 2018-01-27 LAB — POCT URINALYSIS DIPSTICK
Bilirubin, UA: NEGATIVE
GLUCOSE UA: NEGATIVE
KETONES UA: NEGATIVE
LEUKOCYTES UA: NEGATIVE
NITRITE UA: NEGATIVE
Protein, UA: NEGATIVE
RBC UA: NEGATIVE
SPEC GRAV UA: 1.02 (ref 1.010–1.025)
Urobilinogen, UA: 0.2 E.U./dL
pH, UA: 6.5 (ref 5.0–8.0)

## 2018-01-27 NOTE — Patient Instructions (Signed)
Second Trimester of Pregnancy The second trimester is from week 13 through week 28, month 4 through 6. This is often the time in pregnancy that you feel your best. Often times, morning sickness has lessened or quit. You may have more energy, and you may get hungry more often. Your unborn baby (fetus) is growing rapidly. At the end of the sixth month, he or she is about 9 inches long and weighs about 1 pounds. You will likely feel the baby move (quickening) between 18 and 20 weeks of pregnancy. Follow these instructions at home:  Avoid all smoking, herbs, and alcohol. Avoid drugs not approved by your doctor.  Do not use any tobacco products, including cigarettes, chewing tobacco, and electronic cigarettes. If you need help quitting, ask your doctor. You may get counseling or other support to help you quit.  Only take medicine as told by your doctor. Some medicines are safe and some are not during pregnancy.  Exercise only as told by your doctor. Stop exercising if you start having cramps.  Eat regular, healthy meals.  Wear a good support bra if your breasts are tender.  Do not use hot tubs, steam rooms, or saunas.  Wear your seat belt when driving.  Avoid raw meat, uncooked cheese, and liter boxes and soil used by cats.  Take your prenatal vitamins.  Take 1500-2000 milligrams of calcium daily starting at the 20th week of pregnancy until you deliver your baby.  Try taking medicine that helps you poop (stool softener) as needed, and if your doctor approves. Eat more fiber by eating fresh fruit, vegetables, and whole grains. Drink enough fluids to keep your pee (urine) clear or pale yellow.  Take warm water baths (sitz baths) to soothe pain or discomfort caused by hemorrhoids. Use hemorrhoid cream if your doctor approves.  If you have puffy, bulging veins (varicose veins), wear support hose. Raise (elevate) your feet for 15 minutes, 3-4 times a day. Limit salt in your diet.  Avoid heavy  lifting, wear low heals, and sit up straight.  Rest with your legs raised if you have leg cramps or low back pain.  Visit your dentist if you have not gone during your pregnancy. Use a soft toothbrush to brush your teeth. Be gentle when you floss.  You can have sex (intercourse) unless your doctor tells you not to.  Go to your doctor visits. Get help if:  You feel dizzy.  You have mild cramps or pressure in your lower belly (abdomen).  You have a nagging pain in your belly area.  You continue to feel sick to your stomach (nauseous), throw up (vomit), or have watery poop (diarrhea).  You have bad smelling fluid coming from your vagina.  You have pain with peeing (urination). Get help right away if:  You have a fever.  You are leaking fluid from your vagina.  You have spotting or bleeding from your vagina.  You have severe belly cramping or pain.  You lose or gain weight rapidly.  You have trouble catching your breath and have chest pain.  You notice sudden or extreme puffiness (swelling) of your face, hands, ankles, feet, or legs.  You have not felt the baby move in over an hour.  You have severe headaches that do not go away with medicine.  You have vision changes. This information is not intended to replace advice given to you by your health care provider. Make sure you discuss any questions you have with your health care  provider. Document Released: 02/13/2010 Document Revised: 04/26/2016 Document Reviewed: 01/20/2013 Elsevier Interactive Patient Education  2017 [employer] Abdominal Pain During Pregnancy Belly (abdominal) pain is common during pregnancy. Most of the time, it is not a serious problem. Other times, it can be a sign that something is wrong with the pregnancy. Always tell your doctor if you have belly pain. Follow these instructions at home: Monitor your belly pain for any changes. The following actions may help you feel better:  Do not have sex  (intercourse) or put anything in your vagina until you feel better.  Rest until your pain stops.  Drink clear fluids if you feel sick to your stomach (nauseous). Do not eat solid food until you feel better.  Only take medicine as told by your doctor.  Keep all doctor visits as told.  Get help right away if:  You are bleeding, leaking fluid, or pieces of tissue come out of your vagina.  You have more pain or cramping.  You keep throwing up (vomiting).  You have pain when you pee (urinate) or have blood in your pee.  You have a fever.  You do not feel your baby moving as much.  You feel very weak or feel like passing out.  You have trouble breathing, with or without belly pain.  You have a very bad headache and belly pain.  You have fluid leaking from your vagina and belly pain.  You keep having watery poop (diarrhea).  Your belly pain does not go away after resting, or the pain gets worse. This information is not intended to replace advice given to you by your health care provider. Make sure you discuss any questions you have with your health care provider. Document Released: 11/07/2009 Document Revised: 06/27/2016 Document Reviewed: 06/18/2013 Elsevier Interactive Patient Education  2018 [place]. Back Pain in Pregnancy Back pain during pregnancy is common. Back pain may be caused by several factors that are related to changes during your pregnancy. Follow these instructions at home: Managing pain, stiffness, and swelling  If directed, apply ice for sudden (acute) back pain. ? Put ice in a plastic bag. ? Place a towel between your skin and the bag. ? Leave the ice on for 20 minutes, 2-3 times per day.  If directed, apply heat to the affected area before you exercise: ? Place a towel between your skin and the heat pack or heating pad. ? Leave the heat on for 20-30 minutes. ? Remove the heat if your skin turns bright red. This is especially important if you are  unable to feel pain, heat, or cold. You may have a greater risk of getting burned. Activity  Exercise as told by your health care provider. Exercising is the best way to prevent or manage back pain.  Listen to your body when lifting. If lifting hurts, ask for help or bend your knees. This uses your leg muscles instead of your back muscles.  Squat down when picking up something from the floor. Do not bend over.  Only use bed rest as told by your health care provider. Bed rest should only be used for the most severe episodes of back pain. Standing, Sitting, and Lying Down  Do not stand in one place for long periods of time.  Use good posture when sitting. Make sure your head rests over your shoulders and is not hanging forward. Use a pillow on your lower back if necessary.  Try sleeping on your side, preferably the left side,  with a pillow or two between your legs. If you are sore after a night's rest, your bed may be too soft. A firm mattress may provide more support for your back during pregnancy. General instructions  Do not wear high heels.  Eat a healthy diet. Try to gain weight within your health care provider's recommendations.  Use a maternity girdle, elastic sling, or back brace as told by your health care provider.  Take over-the-counter and prescription medicines only as told by your health care provider.  Keep all follow-up visits as told by your health care provider. This is important. This includes any visits with any specialists, such as a physical therapist. Contact a health care provider if:  Your back pain interferes with your daily activities.  You have increasing pain in other parts of your body. Get help right away if:  You develop numbness, tingling, weakness, or problems with the use of your arms or legs.  You develop severe back pain that is not controlled with medicine.  You have a sudden change in bowel or bladder control.  You develop shortness of  breath, dizziness, or you faint.  You develop nausea, vomiting, or sweating.  You have back pain that is a rhythmic, cramping pain similar to labor pains. Labor pain is usually 1-2 minutes apart, lasts for about 1 minute, and involves a bearing down feeling or pressure in your pelvis.  You have back pain and your water breaks or you have vaginal bleeding.  You have back pain or numbness that travels down your leg.  Your back pain developed after you fell.  You develop pain on one side of your back.  You see blood in your urine.  You develop skin blisters in the area of your back pain. This information is not intended to replace advice given to you by your health care provider. Make sure you discuss any questions you have with your health care provider. Document Released: 02/27/2006 Document Revised: 04/26/2016 Document Reviewed: 08/03/2015 Elsevier Interactive Patient Education  2018 [employer]. Morning Sickness Morning sickness is when you feel sick to your stomach (nauseous) during pregnancy. You may feel sick to your stomach and throw up (vomit). You may feel sick in the morning, but you can feel this way any time of day. Some women feel very sick to their stomach and cannot stop throwing up (hyperemesis gravidarum). Follow these instructions at home:  Only take medicines as told by your doctor.  Take multivitamins as told by your doctor. Taking multivitamins before getting pregnant can stop or lessen the harshness of morning sickness.  Eat dry toast or unsalted crackers before getting out of bed.  Eat 5 to 6 small meals a day.  Eat dry and bland foods like rice and baked potatoes.  Do not drink liquids with meals. Drink between meals.  Do not eat greasy, fatty, or spicy foods.  Have someone cook for you if the smell of food causes you to feel sick or throw up.  If you feel sick to your stomach after taking prenatal vitamins, take them at night or with a snack.  Eat  protein when you need a snack (nuts, yogurt, cheese).  Eat unsweetened gelatins for dessert.  Wear a bracelet used for sea sickness (acupressure wristband).  Go to a doctor that puts thin needles into certain body points (acupuncture) to improve how you feel.  Do not smoke.  Use a humidifier to keep the air in your house free of odors.  Get lots of fresh air. Contact a doctor if:  You need medicine to feel better.  You feel dizzy or lightheaded.  You are losing weight. Get help right away if:  You feel very sick to your stomach and cannot stop throwing up.  You pass out (faint). This information is not intended to replace advice given to you by your health care provider. Make sure you discuss any questions you have with your health care provider. Document Released: 12/27/2004 Document Revised: 04/26/2016 Document Reviewed: 05/06/2013 Elsevier Interactive Patient Education  2017 [place]. Common Medications Safe in Pregnancy  Acne:      Constipation:  Benzoyl Peroxide     Colace  Clindamycin      Dulcolax Suppository  Topica Erythromycin     Fibercon  Salicylic Acid      Metamucil         Miralax AVOID:        Senakot   Accutane    Cough:  Retin-A       Cough Drops  Tetracycline      Phenergan w/ Codeine if Rx  Minocycline      Robitussin (Plain & DM)  Antibiotics:     Crabs/Lice:  Ceclor       RID  Cephalosporins    AVOID:  E-Mycins      Kwell  Keflex  Macrobid/Macrodantin   Diarrhea:  Penicillin      Kao-Pectate  Zithromax      Imodium AD         PUSH FLUIDS AVOID:       Cipro     Fever:  Tetracycline      Tylenol (Regular or Extra  Minocycline       Strength)  Levaquin      Extra Strength-Do not          Exceed 8 tabs/24 hrs Caffeine:        '200mg'$ /day (equiv. To 1 cup of coffee or  approx. 3 12 oz  sodas)         Gas: Cold/Hayfever:       Gas-X  Benadryl      Mylicon  Claritin       Phazyme  **Claritin-D        Chlor-Trimeton    Headaches:  Dimetapp      ASA-Free Excedrin  Drixoral-Non-Drowsy     Cold Compress  Mucinex (Guaifenasin)     Tylenol (Regular or Extra  Sudafed/Sudafed-12 Hour     Strength)  **Sudafed PE Pseudoephedrine   Tylenol Cold & Sinus     Vicks Vapor Rub  Zyrtec  **AVOID if Problems With Blood Pressure         Heartburn: Avoid lying down for at least 1 hour after meals  Aciphex      Maalox     Rash:  Milk of Magnesia     Benadryl    Mylanta       1% Hydrocortisone Cream  Pepcid  Pepcid Complete   Sleep Aids:  Prevacid      Ambien   Prilosec       Benadryl  Rolaids       Chamomile Tea  Tums (Limit 4/day)     Unisom  Zantac       Tylenol PM         Warm milk-add vanilla or  Hemorrhoids:       Sugar for taste  Anusol/Anusol H.C.  (RX: Analapram 2.5%)  Sugar Substitutes:  Hydrocortisone OTC  Ok in moderation  Preparation H      Tucks        Vaseline lotion applied to tissue with wiping    Herpes:     Throat:  Acyclovir      Oragel  Famvir  Valtrex     Vaccines:         Flu Shot Leg Cramps:       *Gardasil  Benadryl      Hepatitis A         Hepatitis B Nasal Spray:       Pneumovax  Saline Nasal Spray     Polio Booster         Tetanus Nausea:       Tuberculosis test or PPD  Vitamin B6 25 mg TID   AVOID:    Dramamine      *Gardasil  Emetrol       Live Poliovirus  Ginger Root 250 mg QID    MMR (measles, mumps &  High Complex Carbs @ Bedtime    rebella)  Sea Bands-Accupressure    Varicella (Chickenpox)  Unisom 1/2 tab TID     *No known complications           If received before Pain:         Known pregnancy;   Darvocet       Resume series after  Lortab        Delivery  Percocet    Yeast:   Tramadol      Femstat  Tylenol 3      Gyne-lotrimin  Ultram       Monistat  Vicodin           MISC:         All  Sunscreens           Hair Coloring/highlights          Insect Repellant's          (Including DEET)         [employer] [employer] Eating Plan for Pregnant Women While you are pregnant, your body will require additional nutrition to help support your growing baby. It is recommended that you consume:  150 additional calories each day during your first trimester.  300 additional calories each day during your second trimester.  300 additional calories each day during your third trimester.  Eating a healthy, well-balanced diet is very important for your health and for your baby's health. You also have a higher need for some vitamins and minerals, such as folic acid, calcium, iron, and vitamin D. What do I need to know about eating during pregnancy?  Do not try to lose weight or go on a diet during pregnancy.  Choose healthy, nutritious foods. Choose  of a sandwich with a glass of milk instead of a candy bar or a high-calorie sugar-sweetened beverage.  Limit your overall intake of foods that have "empty calories." These are foods that have little nutritional value, such as sweets, desserts, candies, sugar-sweetened beverages, and fried foods.  Eat a variety of foods, especially fruits and vegetables.  Take a prenatal vitamin to help meet the additional needs during pregnancy, specifically for folic acid, iron, calcium, and vitamin D.  Remember to stay active. Ask your health care provider for exercise recommendations that are specific to you.  Practice good food safety and cleanliness, such as washing your hands before you eat and after you prepare raw meat. This helps to prevent foodborne illnesses, such as listeriosis,  that can be very dangerous for your baby. Ask your health care provider for more information about listeriosis. What does 150 extra calories look like? Healthy options for an additional 150 calories each day could be any of the following:  Plain low-fat yogurt (6-8 oz) with  cup  of berries.  1 apple with 2 teaspoons of peanut butter.  Cut-up vegetables with  cup of hummus.  Low-fat chocolate milk (8 oz or 1 cup).  1 string cheese with 1 medium orange.   of a peanut butter and jelly sandwich on whole-wheat bread (1 tsp of peanut butter).  For 300 calories, you could eat two of those healthy options each day. What is a healthy amount of weight to gain? The recommended amount of weight for you to gain is based on your pre-pregnancy BMI. If your pre-pregnancy BMI was:  Less than 18 (underweight), you should gain 28-40 lb.  18-24.9 (normal), you should gain 25-35 lb.  25-29.9 (overweight), you should gain 15-25 lb.  Greater than 30 (obese), you should gain 11-20 lb.  What if I am having twins or multiples? Generally, pregnant women who will be having twins or multiples may need to increase their daily calories by 300-600 calories each day. The recommended range for total weight gain is 25-54 lb, depending on your pre-pregnancy BMI. Talk with your health care provider for specific guidance about additional nutritional needs, weight gain, and exercise during your pregnancy. What foods can I eat? Grains Any grains. Try to choose whole grains, such as whole-wheat bread, oatmeal, or brown rice. Vegetables Any vegetables. Try to eat a variety of colors and types of vegetables to get a full range of vitamins and minerals. Remember to wash your vegetables well before eating. Fruits Any fruits. Try to eat a variety of colors and types of fruit to get a full range of vitamins and minerals. Remember to wash your fruits well before eating. Meats and Other Protein Sources Lean meats, including chicken, [place], fish, and lean cuts of beef, veal, or pork. Make sure that all meats are cooked to "well done." Tofu. Tempeh. Beans. Eggs. Peanut butter and other nut butters. Seafood, such as shrimp, crab, and lobster. If you choose fish, select types that are higher in omega-3  fatty acids, including salmon, herring, mussels, trout, sardines, and pollock. Make sure that all meats are cooked to food-safe temperatures. Dairy Pasteurized milk and milk alternatives. Pasteurized yogurt and pasteurized cheese. Cottage cheese. Sour cream. Beverages Water. Juices that contain 100% fruit juice or vegetable juice. Caffeine-free teas and decaffeinated coffee. Drinks that contain caffeine are okay to drink, but it is better to avoid caffeine. Keep your total caffeine intake to less than 200 mg each day (12 oz of coffee, tea, or soda) or as directed by your health care provider. Condiments Any pasteurized condiments. Sweets and Desserts Any sweets and desserts. Fats and Oils Any fats and oils. The items listed above may not be a complete list of recommended foods or beverages. Contact your dietitian for more options. What foods are not recommended? Vegetables Unpasteurized (raw) vegetable juices. Fruits Unpasteurized (raw) fruit juices. Meats and Other Protein Sources Cured meats that have nitrates, such as bacon, salami, and hotdogs. Luncheon meats, bologna, or other deli meats (unless they are reheated until they are steaming hot). Refrigerated pate, meat spreads from a meat counter, smoked seafood that is found in the refrigerated section of a store. Raw fish, such as sushi or sashimi. High mercury content fish, such  as tilefish, shark, swordfish, and king mackerel. Raw meats, such as tuna or beef tartare. Undercooked meats and poultry. Make sure that all meats are cooked to food-safe temperatures. Dairy Unpasteurized (raw) milk and any foods that have raw milk in them. Soft cheeses, such as feta, queso blanco, queso fresco, Brie, Camembert cheeses, blue-veined cheeses, and Panela cheese (unless it is made with pasteurized milk, which must be stated on the label). Beverages Alcohol. Sugar-sweetened beverages, such as sodas, teas, or energy drinks. Condiments Homemade  fermented foods and drinks, such as pickles, sauerkraut, or kombucha drinks. (Store-bought pasteurized versions of these are okay.) Other Salads that are made in the store, such as ham salad, chicken salad, egg salad, tuna salad, and seafood salad. The items listed above may not be a complete list of foods and beverages to avoid. Contact your dietitian for more information. This information is not intended to replace advice given to you by your health care provider. Make sure you discuss any questions you have with your health care provider. Document Released: 09/03/2014 Document Revised: 04/26/2016 Document Reviewed: 05/04/2014 Elsevier Interactive Patient Education  2018 [place]. WHAT OB PATIENTS CAN EXPECT   Confirmation of pregnancy and ultrasound ordered if medically indicated-[redacted] weeks gestation  New OB (NOB) intake with nurse and New OB (NOB) labs- [redacted] weeks gestation  New OB (NOB) physical examination with provider- 11/[redacted] weeks gestation  Flu vaccine-[redacted] weeks gestation  Anatomy scan-[redacted] weeks gestation  Glucose tolerance test, blood work to test for anemia, T-dap vaccine-[redacted] weeks gestation  Vaginal swabs/cultures-STD/Group B strep-[redacted] weeks gestation  Appointments every 4 weeks until 28 weeks  Every 2 weeks from 28 weeks until 36 weeks  Weekly visits from 36 weeks until delivery

## 2018-01-27 NOTE — Progress Notes (Signed)
NEW OB HISTORY AND PHYSICAL  SUBJECTIVE:       Tina Rodgers is a 27 y.o. G1P0 female, Patient's last menstrual period was 11/08/2017 (approximate)., Estimated Date of Delivery: 08/11/18, [id number], presents today for establishment of Prenatal Care.  She has no unusual complaints. Endorses cold symptoms, breast tenderness, occasional vomiting, and constipation.  Denies difficulty breathing or respiratory distress, chest pain, abdominal pain, vaginal bleeding, dysuria, and leg pain or swelling.   Panorama collected: 01/14/2018.    Gynecologic History  Patient's last menstrual period was 11/08/2017 (approximate).   Contraception: none  Last Pap: 2017. Results were: normal  Obstetric History OB History  Gravida Para Term Preterm AB Living  1            SAB TAB Ectopic Multiple Live Births               # Outcome Date GA Lbr Len/2nd Weight Sex Delivery Anes PTL Lv  1 Current               Past Medical History:  Diagnosis Date  . ADD (attention deficit disorder)   . Vitamin D deficiency     Past Surgical History:  Procedure Laterality Date  . BREAST SURGERY     2 lumps removed- neg    Current Outpatient Medications on File Prior to Visit  Medication Sig Dispense Refill  . Prenat-Fe Poly-Methfol-FA-DHA (VITAFOL ULTRA) 29-0.6-0.4-200 MG CAPS Take 29 mg by mouth daily. 30 capsule 11  . methylphenidate (RITALIN) 10 MG tablet Take 10 mg by mouth 2 (two) times daily.     No current facility-administered medications on file prior to visit.     Allergies  Allergen Reactions  . Sulfa Antibiotics Other (See Comments)    flush    Social History   Socioeconomic History  . Marital status: Single    Spouse name: Not on file  . Number of children: Not on file  . Years of education: Not on file  . Highest education level: Not on file  Social Needs  . Financial resource strain: Not on file  . Food insecurity - worry: Not on file  . Food insecurity - inability: Not  on file  . Transportation needs - medical: Not on file  . Transportation needs - non-medical: Not on file  Occupational History  . Not on file  Tobacco Use  . Smoking status: Never Smoker  . Smokeless tobacco: Never Used  Substance and Sexual Activity  . Alcohol use: Yes    Frequency: Never    Comment: 3-4 x week  . Drug use: No  . Sexual activity: Yes    Birth control/protection: None  Other Topics Concern  . Not on file  Social History Narrative  . Not on file    Family History  Problem Relation Age of Onset  . Ovarian cancer Paternal Grandmother   . Breast cancer Neg Hx   . Colon cancer Neg Hx   . Diabetes Neg Hx     The following portions of the patient's history were reviewed and updated as appropriate: allergies, current medications, past OB history, past medical history, past surgical history, past family history, past social history, and problem list.    OBJECTIVE:  BP 97/60   Pulse (!) 101   Wt 117 lb 12.8 oz (53.4 kg)   LMP 11/08/2017 (Approximate)   BMI 23.01 kg/m   Initial Physical Exam (New OB)  GENERAL APPEARANCE: alert, well appearing, in no apparent distress  HEAD:  normocephalic, atraumatic  MOUTH: mucous membranes moist, pharynx normal without lesions  THYROID: no thyromegaly or masses present  BREASTS: no masses noted, no significant tenderness, no palpable axillary nodes, no skin changes  LUNGS: clear to auscultation, no wheezes, rales or rhonchi, symmetric air entry  HEART: regular rate and rhythm, no murmurs  ABDOMEN: soft, nontender, nondistended, no abnormal masses, no epigastric pain and FHT present  EXTREMITIES: no redness or tenderness in the calves or thighs, no edema  SKIN: normal coloration and turgor, no rashes  LYMPH NODES: no adenopathy palpable  NEUROLOGIC: alert, oriented, normal speech, no focal findings or movement disorder noted  PELVIC EXAM: not indicated  ASSESSMENT: Normal pregnancy Genetic screening  completed-gender reveal 03/15/2018 Rh negative  PLAN: Prenatal care Rx: Colace, see orders New OB counseling: The patient has been given an overview regarding routine prenatal care. Recommendations regarding diet, weight gain, and exercise in pregnancy were given. Prenatal testing, optional genetic testing, and ultrasound use in pregnancy were reviewed.  Benefits of Breast Feeding were discussed. The patient is encouraged to consider nursing her baby post partum. See orders   Gunnar Bulla, CNM [facility], [facility]

## 2018-01-27 NOTE — Progress Notes (Signed)
Pt is constipated and is having problems going to the bathroom.

## 2018-01-30 ENCOUNTER — Telehealth: Payer: Self-pay | Admitting: Certified Nurse Midwife

## 2018-01-30 MED ORDER — DOCUSATE SODIUM 50 MG PO CAPS: 50.0000 mg | ORAL_CAPSULE | Freq: Two times a day (BID) | ORAL | 1 refills | Status: DC

## 2018-01-30 NOTE — Telephone Encounter (Signed)
Pt aware colace erx. Advised pt she may also purchase them OTC.

## 2018-01-30 NOTE — Telephone Encounter (Signed)
The patient called and stated that she was informed by Marcelino DusterMichelle that a stool softener would have been called in for her. The patient is concerned because there isn't a medication at her pharmacy. No other information was disclosed. Please advise.

## 2018-02-24 ENCOUNTER — Encounter: Payer: Self-pay | Admitting: Certified Nurse Midwife

## 2018-02-24 ENCOUNTER — Ambulatory Visit (INDEPENDENT_AMBULATORY_CARE_PROVIDER_SITE_OTHER): Payer: BC Managed Care – PPO | Admitting: Certified Nurse Midwife

## 2018-02-24 VITALS — BP 109/62 | HR 101 | Wt 120.1 lb

## 2018-02-24 DIAGNOSIS — Z3402 Encounter for supervision of normal first pregnancy, second trimester: Secondary | ICD-10-CM

## 2018-02-24 LAB — POCT URINALYSIS DIPSTICK
Bilirubin, UA: NEGATIVE
Blood, UA: NEGATIVE
Glucose, UA: NEGATIVE
KETONES UA: NEGATIVE
Leukocytes, UA: NEGATIVE
NITRITE UA: NEGATIVE
PH UA: 6.5 (ref 5.0–8.0)
PROTEIN UA: NEGATIVE
Spec Grav, UA: 1.015 (ref 1.010–1.025)
UROBILINOGEN UA: 0.2 U/dL

## 2018-02-24 MED ORDER — SERTRALINE HCL 50 MG PO TABS: 50.0000 mg | ORAL_TABLET | Freq: Every day | ORAL | 0 refills | Status: DC

## 2018-02-24 NOTE — Progress Notes (Signed)
ROB, doing well. Pt is emotional stating that she has anxiety and is feeling very anxious with pregnancy. She request medication. She has taken Zoloft in the past. Reviewed benefits vs risk . Fact sheet from mother to baby given to pt. She verbalizes understanding and agrees to plan . Follow up in 4 wks or sooner if needed.   Doreene BurkeAnnie Xochil Shanker, CNM

## 2018-02-24 NOTE — Patient Instructions (Signed)

## 2018-02-24 NOTE — Progress Notes (Signed)
Pt is here for an ROB visit. Is c/o stress.

## 2018-03-24 ENCOUNTER — Ambulatory Visit (INDEPENDENT_AMBULATORY_CARE_PROVIDER_SITE_OTHER): Payer: BC Managed Care – PPO | Admitting: Obstetrics and Gynecology

## 2018-03-24 ENCOUNTER — Ambulatory Visit (INDEPENDENT_AMBULATORY_CARE_PROVIDER_SITE_OTHER): Payer: BC Managed Care – PPO

## 2018-03-24 VITALS — BP 112/66 | HR 120 | Wt 123.8 lb

## 2018-03-24 DIAGNOSIS — Z3492 Encounter for supervision of normal pregnancy, unspecified, second trimester: Secondary | ICD-10-CM | POA: Diagnosis not present

## 2018-03-24 DIAGNOSIS — Z3402 Encounter for supervision of normal first pregnancy, second trimester: Secondary | ICD-10-CM

## 2018-03-24 LAB — POCT URINALYSIS DIPSTICK
BILIRUBIN UA: NEGATIVE
GLUCOSE UA: NEGATIVE
Ketones, UA: NEGATIVE
Leukocytes, UA: NEGATIVE
Nitrite, UA: NEGATIVE
Protein, UA: NEGATIVE
RBC UA: NEGATIVE
Spec Grav, UA: 1.01 (ref 1.010–1.025)
Urobilinogen, UA: 0.2 E.U./dL
pH, UA: 6.5 (ref 5.0–8.0)

## 2018-03-24 NOTE — Progress Notes (Signed)
ROB- pt is doing well, has anatomy scan after this appt

## 2018-03-24 NOTE — Progress Notes (Signed)
ROB and anatomy scan today-doing well, reassured of normal findings.

## 2018-04-21 ENCOUNTER — Encounter: Payer: Self-pay | Admitting: Certified Nurse Midwife

## 2018-04-21 ENCOUNTER — Ambulatory Visit (INDEPENDENT_AMBULATORY_CARE_PROVIDER_SITE_OTHER): Payer: BC Managed Care – PPO | Admitting: Certified Nurse Midwife

## 2018-04-21 VITALS — BP 113/68 | HR 101 | Wt 130.2 lb

## 2018-04-21 DIAGNOSIS — Z3492 Encounter for supervision of normal pregnancy, unspecified, second trimester: Secondary | ICD-10-CM

## 2018-04-21 LAB — POCT URINALYSIS DIPSTICK
Bilirubin, UA: NEGATIVE
Glucose, UA: NEGATIVE
Ketones, UA: NEGATIVE
LEUKOCYTES UA: NEGATIVE
NITRITE UA: NEGATIVE
PH UA: 7 (ref 5.0–8.0)
PROTEIN UA: NEGATIVE
RBC UA: NEGATIVE
SPEC GRAV UA: 1.015 (ref 1.010–1.025)
Urobilinogen, UA: 0.2 E.U./dL

## 2018-04-21 NOTE — Progress Notes (Signed)
ROB doing well. No complaints. Feels good movement. Anticipatory guidance for 28 wk visit. PTL precautions reviewed. Follow up 4 wks.   Doreene Burke, CNM

## 2018-04-21 NOTE — Progress Notes (Signed)
Pt is here for an ROB visit. 

## 2018-04-21 NOTE — Patient Instructions (Signed)

## 2018-05-22 ENCOUNTER — Ambulatory Visit (INDEPENDENT_AMBULATORY_CARE_PROVIDER_SITE_OTHER): Payer: BC Managed Care – PPO | Admitting: Certified Nurse Midwife

## 2018-05-22 ENCOUNTER — Other Ambulatory Visit: Payer: BC Managed Care – PPO

## 2018-05-22 VITALS — BP 98/62 | HR 102 | Wt 138.1 lb

## 2018-05-22 DIAGNOSIS — O360130 Maternal care for anti-D [Rh] antibodies, third trimester, not applicable or unspecified: Secondary | ICD-10-CM

## 2018-05-22 DIAGNOSIS — Z3A28 28 weeks gestation of pregnancy: Secondary | ICD-10-CM | POA: Diagnosis not present

## 2018-05-22 DIAGNOSIS — Z3492 Encounter for supervision of normal pregnancy, unspecified, second trimester: Secondary | ICD-10-CM

## 2018-05-22 LAB — POCT URINALYSIS DIPSTICK
Bilirubin, UA: NEGATIVE
Glucose, UA: NEGATIVE
Ketones, UA: NEGATIVE
LEUKOCYTES UA: NEGATIVE
NITRITE UA: NEGATIVE
PH UA: 8 (ref 5.0–8.0)
PROTEIN UA: NEGATIVE
RBC UA: NEGATIVE
Spec Grav, UA: 1.01 (ref 1.010–1.025)
UROBILINOGEN UA: 0.2 U/dL

## 2018-05-22 MED ORDER — RHO D IMMUNE GLOBULIN 1500 UNITS IM SOSY
1500.0000 [IU] | PREFILLED_SYRINGE | Freq: Once | INTRAMUSCULAR | Status: AC
  Administered 2018-05-22: 1500 [IU] via INTRAMUSCULAR

## 2018-05-22 MED ORDER — TETANUS-DIPHTH-ACELL PERTUSSIS 5-2.5-18.5 LF-MCG/0.5 IM SUSP
0.5000 mL | Freq: Once | INTRAMUSCULAR | Status: AC
  Administered 2018-05-22: 0.5 mL via INTRAMUSCULAR

## 2018-05-22 NOTE — Patient Instructions (Addendum)
Pregnancy and Travel Most pregnant woman can safely travel until the last month of the pregnancy. However, pregnant women with medical problems or problems with their pregnancy should limit or avoid travel. The best time to travel is between 14 and 28 weeks of the pregnancy. During this period, morning sickness should be minimal and other problems are less likely to develop. General travel tips Before you go:  Discuss your trip with your health care provider and get examined shortly before you go.  Get a copy of your medical records and be sure to take them with you.  Try to get names of doctors and hospitals in the area you will be visiting.  Pack your pillow if you can.  Get a good night's sleep the night before you make your trip.  During your trip:  Ask for locations of doctors and hospitals if you did not do this before leaving.  Wear flat, comfortable shoes.  Eat a balanced diet, drink a lot of fluids, and take your vitamins and supplements.  Do not wear yourself out.  Do not ride on a motorcycle.  Rest. If you spent a lot of time traveling, lie down for 30 or more minutes with your feet slightly raised after you reach your destination.  Tips for traveling to a foreign country Before you go:  Ask your health care provider if there are any medicines that are safe to take if you get diarrhea, constipation, nausea, or vomiting.  Make copies of your medical records in case you lose the originals.  During your trip:  Do not eat uncooked foods of any kind.  Drink bottled water and do not use ice.  Wash fruits and vegetables with hot, soapy water.  Only drink pasteurized milk.  Tips for traveling by car  Wear your seat belt properly.  If you are in the front seat, sit as far away from the dashboard as possible to avoid getting hit hard if the air bag deploys in an accident.  Stop about every 2 hours to use the restroom and walk around. This helps the circulation in  your legs.  Keep water, crackers, and fruit in the car.  Do not travel for more than 6 hours a day. Tips for traveling by bus  Before making a reservation, ask whether your bus will have a restroom.  Take water, crackers, and fruit with you.  Get out and walk around if and when the bus stops.  Move your arms and legs when seated. This helps with your circulation. Tips for traveling by train Before making a reservation, ask if your train will have a sleeping car and more than one restroom. Tips for traveling by airplane  Before booking your trip, ask about the airline's rules about pregnancy. Pregnant women may be restricted from [place] after a certain time of the pregnancy. Every airline has its own rules and regulations.  Ask whether the airplane cabin will be pressurized. Do not board an unpressurized plane that will fly above 7,000 ft (2,100 km).  Try to get a bulkhead or an aisle seat.  Wear layered clothing because the temperature in the cabin can change.  Take water, crackers, and fruit with you on the airplane.  Put all your medicines and medical records in your carry-on bag.  Avoid drinks with caffeine and do not eat a big meal.  Do not walk around the airplane to stretch your legs.  Move your arms and legs while sitting to help with your  circulation.  Wear your seat belt at all times. Tips for traveling by cruise ship  Before booking your trip, ask the following questions: ? Are pregnant women allowed on the cruise ship? ? Is there a medical facility and health care provider on board? ? Does the ship dock in cities where there are health care providers and medical facilities?  Before booking your trip, ask your health care provider if: ? It is safe for you to take medicines if you get seasick. ? It is safe for you to wear acupressure wristbands to prevent getting seasick. If your health care provider says it is safe, consider purchasing one. This information is  not intended to replace advice given to you by your health care provider. Make sure you discuss any questions you have with your health care provider. Document Released: 11/01/2008 Document Revised: 04/26/2016 Document Reviewed: 10/16/2013 Elsevier Interactive Patient Education  2017 [employer] WHAT OB PATIENTS CAN EXPECT   Confirmation of pregnancy and ultrasound ordered if medically indicated-[redacted] weeks gestation  New OB (NOB) intake with nurse and New OB (NOB) labs- [redacted] weeks gestation  New OB (NOB) physical examination with provider- 11/[redacted] weeks gestation  Flu vaccine-[redacted] weeks gestation  Anatomy scan-[redacted] weeks gestation  Glucose tolerance test, blood work to test for anemia, T-dap vaccine-[redacted] weeks gestation  Vaginal swabs/cultures-STD/Group B strep-[redacted] weeks gestation  Appointments every 4 weeks until 28 weeks  Every 2 weeks from 28 weeks until 36 weeks  Weekly visits from 36 weeks until delivery  Breastfeeding Challenges and Solutions Even though breastfeeding is natural, it can be challenging, especially in the first few weeks after childbirth. It is normal for problems to arise when starting to breastfeed your new baby, even if you have breastfed before. This document provides some solutions to the most common breastfeeding challenges. Challenges and solutions Challenge-Cracked or Sore Nipples Cracked or sore nipples are commonly experienced by breastfeeding mothers. Cracked or sore nipples often are caused by inadequate latching (when your baby's mouth attaches to your breast to breastfeed). Soreness can also happen if your baby is not positioned properly at your breast. Although nipple cracking and soreness are common during the first week after birth, nipple pain is never normal. If you experience nipple cracking or soreness that lasts longer than 1 week or nipple pain, call your health care provider or lactation consultant. Solution Ensure proper latching and positioning of  your baby by following the steps below:  Find a comfortable place to sit or lie down, with your neck and back well supported.  Place a pillow or rolled up blanket under your baby to bring him or her to the level of your breast (if you are seated).  Make sure that your baby's abdomen is facing your abdomen.  Gently massage your breast. With your fingertips, massage from your chest wall toward your nipple in a circular motion. This encourages milk flow. You may need to continue this action during the feeding if your milk flows slowly.  Support your breast with 4 fingers underneath and your thumb above your nipple. Make sure your fingers are well away from your nipple and your baby's mouth.  Stroke your baby's lips gently with your finger or nipple.  When your baby's mouth is open wide enough, quickly bring your baby to your breast, placing your entire nipple and as much of the colored area around your nipple (areola) as possible into your baby's mouth. ? More areola should be visible above your baby's upper lip  than below the lower lip. ? Your baby's tongue should be between his or her lower gum and your breast.  Ensure that your baby's mouth is correctly positioned around your nipple (latched). Your baby's lips should create a seal on your breast and be turned out (everted).  It is common for your baby to suck for about 2-3 minutes in order to start the flow of breast milk.  Signs that your baby has successfully latched on to your nipple include:  Quietly tugging or quietly sucking without causing you pain.  Swallowing heard between every 3-4 sucks.  Muscle movement above and in front of his or her ears with sucking.  Signs that your baby has not successfully latched on to nipple include:  Sucking sounds or smacking sounds from your baby while nursing.  Nipple pain.  Ensure that your breasts stay moisturized and healthy by:  Avoiding the use of soap on your nipples.  Wearing a  supportive bra. Avoid wearing underwire-style bras or tight bras.  Air drying your nipples for 3-4 minutes after each feeding.  Using only cotton bra pads to absorb breast milk leakage. Leaking of breast milk between feedings is normal. Be sure to change the pads if they become soaked with milk.  Using lanolin on your nipples after nursing. Lanolin helps to maintain your skin's normal moisture barrier. If you use pure lanolin you do not need to wash it off before feeding your baby again. Pure lanolin is not toxic to your baby. You may also hand express a few drops of breast milk and gently massage that milk into your nipples, allowing it to air dry.  Challenge-Breast Engorgement Breast engorgement is the overfilling of your breasts with breast milk. In the first few weeks after giving birth, you may experience breast engorgement. Breast engorgement can make your breasts throb and feel hard, tightly stretched, warm, and tender. Engorgement peaks about the fifth day after you give birth. Having breast engorgement does not mean you have to stop breastfeeding your baby. Solution  Breastfeed when you feel the need to reduce the fullness of your breasts or when your baby shows signs of hunger. This is called "breastfeeding on demand."  Newborns (babies younger than 4 weeks) often breastfeed every 1-3 hours during the day. You may need to awaken your baby to feed if he or she is asleep at a feeding time.  Do not allow your baby to sleep longer than 5 hours during the night without a feeding.  Pump or hand express breast milk before breastfeeding to soften your breast, areola, and nipple.  Apply warm, moist heat (in the shower or with warm water-soaked hand towels) just before feeding or pumping, or massage your breast before or during breastfeeding. This increases circulation and helps your milk to flow.  Completely empty your breasts when breastfeeding or pumping. Afterward, wear a snug bra (nursing  or regular) or tank top for 1-2 days to signal your body to slightly decrease milk production. Only wear snug bras or tank tops to treat engorgement. Tight bras typically should be avoided by breastfeeding mothers. Once engorgement is relieved, return to wearing regular, loose-fitting clothes.  Apply ice packs to your breasts to lessen the pain from engorgement and relieve swelling, unless the ice is uncomfortable for you.  Do not delay feedings. Try to relax when it is time to feed your baby. This helps to trigger your "let-down reflex," which releases milk from your breast.  Ensure your baby is latched  on to your breast and positioned properly while breastfeeding.  Allow your baby to remain at your breast as long as he or she is latched on well and actively sucking. Your baby will let you know when he or she is done breastfeeding by pulling away from your breast or falling asleep.  Avoid introducing bottles or pacifiers to your baby in the early weeks of breastfeeding. Wait to introduce these things until after resolving any breastfeeding challenges.  Try to pump your milk on the same schedule as when your baby would breastfeed if you are returning to work or away from home for an extended period.  Drink plenty of fluids to avoid dehydration, which can eventually put you at greater risk of breast engorgement.  If you follow these suggestions, your engorgement should improve in 24-48 hours. If you are still experiencing difficulty, call your lactation consultant or health care provider. Challenge-Plugged Milk Ducts Plugged milk ducts occur when the duct does not drain milk effectively and becomes swollen. Wearing a tight-fitting nursing bra or having difficulty with latching may cause plugged milk ducts. Not drinking enough water (8-10 c [1.9-2.4 L] per day) can contribute to plugged milk ducts. Once a duct has become plugged, hard lumps, soreness, and redness may develop in your  breast. Solution Do not delay feedings. Feed your baby frequently and try to empty your breasts of milk at each feeding. Try breastfeeding from the affected side first so there is a better chance that the milk will drain completely from that breast. Apply warm, moist towels to your breasts for 5-10 minutes before feeding. Alternatively, a hot shower right before breastfeeding can provide the moist heat that can encourage milk flow. Gentle massage of the sore area before and during a feeding may also help. Avoid wearing tight clothing or bras that put pressure on your breasts. Wear bras that offer good support to your breasts, but avoid underwire bras. If you have a plugged milk duct and develop a fever, you need to see your health care provider. Challenge-Mastitis Mastitis is inflammation of your breast. It usually is caused by a bacterial infection and can cause flu-like symptoms. You may develop redness in your breast and a fever. Often when mastitis occurs, your breast becomes firm, warm, and very painful. The most common causes of mastitis are poor latching, ineffective sucking from your baby, consistent pressure on your breast (possibly from wearing a tight-fitting bra or shirt that restricts the milk flow), unusual stress or fatigue, or missed feedings. Solution You will be given antibiotic medicine to treat the infection. It is still important to breastfeed frequently to empty your breasts. Continuing to breastfeed while you recover from mastitis will not harm your baby. Make sure your baby is positioned properly during every feeding. Apply moist heat to your breasts for a few minutes before feeding to help the milk flow and to help your breasts empty more easily. Challenge-Thrush Ritta Slot is a yeast infection that can form on your nipples, in your breast, or in your baby's mouth. It causes itching, soreness, burning or stabbing pain, and sometimes a rash. Solution You will be given a medicated  ointment for your nipples, and your baby will be given a liquid medicine for his or her mouth. It is important that you and your baby are treated at the same time because thrush can be passed between you and your baby. Change disposable nursing pads often. Any bras, towels, or clothing that come in contact with infected areas  of your body or your baby's body need to be washed in very hot water every day. Wash your hands and your baby's hands often. All pacifiers, bottle nipples, or toys your baby puts in his or her mouth should be boiled once a day for 20 minutes. After 1 week of treatment, discard pacifiers and bottle nipples and buy new ones. All breast pump parts that touch the milk need to be boiled for 20 minutes every day. Challenge-Low Milk Supply You may not be producing enough milk if your baby is not gaining the proper amount of weight. Breast milk production is based on a supply-and-demand system. Your milk supply depends on how frequently and effectively your baby empties your breast. Solution The more you breastfeed and pump, the more breast milk you will produce. It is important that your baby empties at least one of your breasts at each feeding. If this is not happening, then use a breast pump or hand express any milk that remains. This will help to drain as much milk as possible at each feeding. It will also signal your body to produce more milk. If your baby is not emptying your breasts, it may be due to latching, sucking, or positioning problems. If low milk supply continues after addressing these issues, contact your health care provider or a lactation specialist as soon as possible. Challenge-Inverted or Flat Nipples Some women have nipples that turn inward instead of protruding outward. Other women have nipples that are flat. Inverted or flat nipples can sometimes make it more difficult for your baby to latch onto your breast. Solution You may be given a small device that pulls out  inverted nipples. This device should be applied right before your baby is brought to your breast. You can also try using a breast pump for a short time before placing the baby at your breast. The pump can pull your nipple outwards to help your infant latch more easily. The baby's sucking motion will help the inverted nipple protrude as well. If you have flat nipples, encourage your baby to latch onto your breast and feed frequently in the early days after birth. This will give your baby practice latching on correctly while your breast is still soft. When your milk supply increases, between the second and fifth day after birth and your breasts become full, your baby will have an easier time latching. Contact a lactation consultant if you still have concerns. She or he can teach you additional techniques to address breastfeeding problems related to nipple shape and position. Where to find more information: [employer]: www.llli.org This information is not intended to replace advice given to you by your health care provider. Make sure you discuss any questions you have with your health care provider. Document Released: 05/13/2006 Document Revised: 05/02/2016 Document Reviewed: 05/15/2013 Elsevier Interactive Patient Education  2017 [employer]. Breastfeeding Choosing to breastfeed is one of the best decisions you can make for yourself and your baby. A change in hormones during pregnancy causes your breasts to make breast milk in your milk-producing glands. Hormones prevent breast milk from being released before your baby is born. They also prompt milk flow after birth. Once breastfeeding has begun, thoughts of your baby, as well as his or her sucking or crying, can stimulate the release of milk from your milk-producing glands. Benefits of breastfeeding Research shows that breastfeeding offers many health benefits for infants and mothers. It also offers a cost-free and convenient way to  feed your  baby. For your baby  Your first milk (colostrum) helps your baby's digestive system to function better.  Special cells in your milk (antibodies) help your baby to fight off infections.  Breastfed babies are less likely to develop asthma, allergies, obesity, or type 2 diabetes. They are also at lower risk for sudden infant death syndrome (SIDS).  Nutrients in breast milk are better able to meet your baby's needs compared to infant formula.  Breast milk improves your baby's brain development. For you  Breastfeeding helps to create a very special bond between you and your baby.  Breastfeeding is convenient. Breast milk costs nothing and is always available at the correct temperature.  Breastfeeding helps to burn calories. It helps you to lose the weight that you gained during pregnancy.  Breastfeeding makes your uterus return faster to its size before pregnancy. It also slows bleeding (lochia) after you give birth.  Breastfeeding helps to lower your risk of developing type 2 diabetes, osteoporosis, rheumatoid arthritis, cardiovascular disease, and breast, ovarian, uterine, and endometrial cancer later in life. Breastfeeding basics Starting breastfeeding  Find a comfortable place to sit or lie down, with your neck and back well-supported.  Place a pillow or a rolled-up blanket under your baby to bring him or her to the level of your breast (if you are seated). Nursing pillows are specially designed to help support your arms and your baby while you breastfeed.  Make sure that your baby's tummy (abdomen) is facing your abdomen.  Gently massage your breast. With your fingertips, massage from the outer edges of your breast inward toward the nipple. This encourages milk flow. If your milk flows slowly, you may need to continue this action during the feeding.  Support your breast with 4 fingers underneath and your thumb above your nipple (make the letter "C" with your hand). Make  sure your fingers are well away from your nipple and your baby's mouth.  Stroke your baby's lips gently with your finger or nipple.  When your baby's mouth is open wide enough, quickly bring your baby to your breast, placing your entire nipple and as much of the areola as possible into your baby's mouth. The areola is the colored area around your nipple. ? More areola should be visible above your baby's upper lip than below the lower lip. ? Your baby's lips should be opened and extended outward (flanged) to ensure an adequate, comfortable latch. ? Your baby's tongue should be between his or her lower gum and your breast.  Make sure that your baby's mouth is correctly positioned around your nipple (latched). Your baby's lips should create a seal on your breast and be turned out (everted).  It is common for your baby to suck about 2-3 minutes in order to start the flow of breast milk. Latching Teaching your baby how to latch onto your breast properly is very important. An improper latch can cause nipple pain, decreased milk supply, and poor weight gain in your baby. Also, if your baby is not latched onto your nipple properly, he or she may swallow some air during feeding. This can make your baby fussy. Burping your baby when you switch breasts during the feeding can help to get rid of the air. However, teaching your baby to latch on properly is still the best way to prevent fussiness from swallowing air while breastfeeding. Signs that your baby has successfully latched onto your nipple  Silent tugging or silent sucking, without causing you pain. Infant's lips should be  extended outward (flanged).  Swallowing heard between every 3-4 sucks once your milk has started to flow (after your let-down milk reflex occurs).  Muscle movement above and in front of his or her ears while sucking.  Signs that your baby has not successfully latched onto your nipple  Sucking sounds or smacking sounds from your  baby while breastfeeding.  Nipple pain.  If you think your baby has not latched on correctly, slip your finger into the corner of your baby's mouth to break the suction and place it between your baby's gums. Attempt to start breastfeeding again. Signs of successful breastfeeding Signs from your baby  Your baby will gradually decrease the number of sucks or will completely stop sucking.  Your baby will fall asleep.  Your baby's body will relax.  Your baby will retain a small amount of milk in his or her mouth.  Your baby will let go of your breast by himself or herself.  Signs from you  Breasts that have increased in firmness, weight, and size 1-3 hours after feeding.  Breasts that are softer immediately after breastfeeding.  Increased milk volume, as well as a change in milk consistency and color by the fifth day of breastfeeding.  Nipples that are not sore, cracked, or bleeding.  Signs that your baby is getting enough milk  Wetting at least 1-2 diapers during the first 24 hours after birth.  Wetting at least 5-6 diapers every 24 hours for the first week after birth. The urine should be clear or pale yellow by the age of 5 days.  Wetting 6-8 diapers every 24 hours as your baby continues to grow and develop.  At least 3 stools in a 24-hour period by the age of 5 days. The stool should be soft and yellow.  At least 3 stools in a 24-hour period by the age of 7 days. The stool should be seedy and yellow.  No loss of weight greater than 10% of birth weight during the first 3 days of life.  Average weight gain of 4-7 oz (113-198 g) per week after the age of 4 days.  Consistent daily weight gain by the age of 5 days, without weight loss after the age of 2 weeks. After a feeding, your baby may spit up a small amount of milk. This is normal. Breastfeeding frequency and duration Frequent feeding will help you make more milk and can prevent sore nipples and extremely full breasts  (breast engorgement). Breastfeed when you feel the need to reduce the fullness of your breasts or when your baby shows signs of hunger. This is called "breastfeeding on demand." Signs that your baby is hungry include:  Increased alertness, activity, or restlessness.  Movement of the head from side to side.  Opening of the mouth when the corner of the mouth or cheek is stroked (rooting).  Increased sucking sounds, smacking lips, cooing, sighing, or squeaking.  Hand-to-mouth movements and sucking on fingers or hands.  Fussing or crying.  Avoid introducing a pacifier to your baby in the first 4-6 weeks after your baby is born. After this time, you may choose to use a pacifier. Research has shown that pacifier use during the first year of a baby's life decreases the risk of sudden infant death syndrome (SIDS). Allow your baby to feed on each breast as long as he or she wants. When your baby unlatches or falls asleep while feeding from the first breast, offer the second breast. Because newborns are often sleepy  in the first few weeks of life, you may need to awaken your baby to get him or her to feed. Breastfeeding times will vary from baby to baby. However, the following rules can serve as a guide to help you make sure that your baby is properly fed:  Newborns (babies 70 weeks of age or younger) may breastfeed every 1-3 hours.  Newborns should not go without breastfeeding for longer than 3 hours during the day or 5 hours during the night.  You should breastfeed your baby a minimum of 8 times in a 24-hour period.  Breast milk pumping Pumping and storing breast milk allows you to make sure that your baby is exclusively fed your breast milk, even at times when you are unable to breastfeed. This is especially important if you go back to work while you are still breastfeeding, or if you are not able to be present during feedings. Your lactation consultant can help you find a method of pumping that  works best for you and give you guidelines about how long it is safe to store breast milk. Caring for your breasts while you breastfeed Nipples can become dry, cracked, and sore while breastfeeding. The following recommendations can help keep your breasts moisturized and healthy:  Avoid using soap on your nipples.  Wear a supportive bra designed especially for nursing. Avoid wearing underwire-style bras or extremely tight bras (sports bras).  Air-dry your nipples for 3-4 minutes after each feeding.  Use only cotton bra pads to absorb leaked breast milk. Leaking of breast milk between feedings is normal.  Use lanolin on your nipples after breastfeeding. Lanolin helps to maintain your skin's normal moisture barrier. Pure lanolin is not harmful (not toxic) to your baby. You may also hand express a few drops of breast milk and gently massage that milk into your nipples and allow the milk to air-dry.  In the first few weeks after giving birth, some women experience breast engorgement. Engorgement can make your breasts feel heavy, warm, and tender to the touch. Engorgement peaks within 3-5 days after you give birth. The following recommendations can help to ease engorgement:  Completely empty your breasts while breastfeeding or pumping. You may want to start by applying warm, moist heat (in the shower or with warm, water-soaked hand towels) just before feeding or pumping. This increases circulation and helps the milk flow. If your baby does not completely empty your breasts while breastfeeding, pump any extra milk after he or she is finished.  Apply ice packs to your breasts immediately after breastfeeding or pumping, unless this is too uncomfortable for you. To do this: ? Put ice in a plastic bag. ? Place a towel between your skin and the bag. ? Leave the ice on for 20 minutes, 2-3 times a day.  Make sure that your baby is latched on and positioned properly while breastfeeding.  If engorgement  persists after 48 hours of following these recommendations, contact your health care provider or a Science writer. Overall health care recommendations while breastfeeding  Eat 3 healthy meals and 3 snacks every day. Well-nourished mothers who are breastfeeding need an additional 450-500 calories a day. You can meet this requirement by increasing the amount of a balanced diet that you eat.  Drink enough water to keep your urine pale yellow or clear.  Rest often, relax, and continue to take your prenatal vitamins to prevent fatigue, stress, and low vitamin and mineral levels in your body (nutrient deficiencies).  Do not use  any products that contain nicotine or tobacco, such as cigarettes and e-cigarettes. Your baby may be harmed by chemicals from cigarettes that pass into breast milk and exposure to secondhand smoke. If you need help quitting, ask your health care provider.  Avoid alcohol.  Do not use illegal drugs or marijuana.  Talk with your health care provider before taking any medicines. These include over-the-counter and prescription medicines as well as vitamins and herbal supplements. Some medicines that may be harmful to your baby can pass through breast milk.  It is possible to become pregnant while breastfeeding. If birth control is desired, ask your health care provider about options that will be safe while breastfeeding your baby. Where to find more information: [employer]: www.llli.org Contact a health care provider if:  You feel like you want to stop breastfeeding or have become frustrated with breastfeeding.  Your nipples are cracked or bleeding.  Your breasts are red, tender, or warm.  You have: ? Painful breasts or nipples. ? A swollen area on either breast. ? A fever or chills. ? Nausea or vomiting. ? Drainage other than breast milk from your nipples.  Your breasts do not become full before feedings by the fifth day after you give  birth.  You feel sad and depressed.  Your baby is: ? Too sleepy to eat well. ? Having trouble sleeping. ? More than 78 week old and wetting fewer than 6 diapers in a 24-hour period. ? Not gaining weight by 76 days of age.  Your baby has fewer than 3 stools in a 24-hour period.  Your baby's skin or the white parts of his or her eyes become yellow. Get help right away if:  Your baby is overly tired (lethargic) and does not want to wake up and feed.  Your baby develops an unexplained fever. Summary  Breastfeeding offers many health benefits for infant and mothers.  Try to breastfeed your infant when he or she shows early signs of hunger.  Gently tickle or stroke your baby's lips with your finger or nipple to allow the baby to open his or her mouth. Bring the baby to your breast. Make sure that much of the areola is in your baby's mouth. Offer one side and burp the baby before you offer the other side.  Talk with your health care provider or lactation consultant if you have questions or you face problems as you breastfeed. This information is not intended to replace advice given to you by your health care provider. Make sure you discuss any questions you have with your health care provider. Document Released: 11/19/2005 Document Revised: 12/21/2016 Document Reviewed: 12/21/2016 Elsevier Interactive Patient Education  2018 [employer]. [facility]  [place], [place], [place] [address]  Phone: [phone]   [facility] (second location)  [address] [place], [place] [address]  Phone: [phone]   [facility]) [place], [place], [place] [address] Phone: [phone]   [address] [place], [place] [address]  Phone: [phone] Medications Safe in Pregnancy  Acne:      Constipation:  Benzoyl Peroxide     Colace  Clindamycin      Dulcolax  Suppository  Topica Erythromycin     Fibercon  Salicylic Acid      Metamucil         Miralax AVOID:        Senakot   Accutane  Cough:  Retin-A       Cough Drops  Tetracycline      Phenergan w/ Codeine if Rx  Minocycline      Robitussin (Plain & DM)  Antibiotics:     Crabs/Lice:  Ceclor       RID  Cephalosporins    AVOID:  E-Mycins      Kwell  Keflex  Macrobid/Macrodantin   Diarrhea:  Penicillin      Kao-Pectate  Zithromax      Imodium AD         PUSH FLUIDS AVOID:       Cipro     Fever:  Tetracycline      Tylenol (Regular or Extra  Minocycline       Strength)  Levaquin      Extra Strength-Do not          Exceed 8 tabs/24 hrs Caffeine:        '200mg'$ /day (equiv. To 1 cup of coffee or  approx. 3 12 oz sodas)         Gas: Cold/Hayfever:       Gas-X  Benadryl      Mylicon  Claritin       Phazyme  **Claritin-D        Chlor-Trimeton    Headaches:  Dimetapp      ASA-Free Excedrin  Drixoral-Non-Drowsy     Cold Compress  Mucinex (Guaifenasin)     Tylenol (Regular or Extra  Sudafed/Sudafed-12 Hour     Strength)  **Sudafed PE Pseudoephedrine   Tylenol Cold & Sinus     Vicks Vapor Rub  Zyrtec  **AVOID if Problems With Blood Pressure         Heartburn: Avoid lying down for at least 1 hour after meals  Aciphex      Maalox     Rash:  Milk of Magnesia     Benadryl    Mylanta       1% Hydrocortisone Cream  Pepcid  Pepcid Complete   Sleep Aids:  Prevacid      Ambien   Prilosec       Benadryl  Rolaids       Chamomile Tea  Tums (Limit 4/day)     Unisom  Zantac       Tylenol PM         Warm milk-add vanilla or  Hemorrhoids:       Sugar for taste  Anusol/Anusol H.C.  (RX: Analapram 2.5%)  Sugar Substitutes:  Hydrocortisone OTC     Ok in moderation  Preparation H      Tucks        Vaseline lotion applied to tissue with wiping    Herpes:     Throat:  Acyclovir      Oragel  Famvir  Valtrex     Vaccines:         Flu Shot Leg  Cramps:       *Gardasil  Benadryl      Hepatitis A         Hepatitis B Nasal Spray:       Pneumovax  Saline Nasal Spray     Polio Booster         Tetanus Nausea:       Tuberculosis test or PPD  Vitamin B6 25 mg TID   AVOID:    Dramamine      *Gardasil  Emetrol       Live Poliovirus  Ginger Root 250 mg  QID    MMR (measles, mumps &  High Complex Carbs @ Bedtime    rebella)  Sea Bands-Accupressure    Varicella (Chickenpox)  Unisom 1/2 tab TID     *No known complications           If received before Pain:         Known pregnancy;   Darvocet       Resume series after  Lortab        Delivery  Percocet    Yeast:   Tramadol      Femstat  Tylenol 3      Gyne-lotrimin  Ultram       Monistat  Vicodin           MISC:         All Sunscreens           Hair Coloring/highlights          Insect Repellant's          (Including DEET)         Mystic Tans Pain Relief During Labor and Delivery Many things can cause pain during labor and delivery, including:  Pressure on bones and ligaments due to the baby moving through the pelvis.  Stretching of tissues due to the baby moving through the birth canal.  Muscle tension due to anxiety or nervousness.  The uterus tightening (contracting) and relaxing to help move the baby.  There are many ways to deal with the pain of labor and delivery. They include:  Taking prenatal classes. Taking these classes helps you know what to expect during your baby's birth. What you learn will increase your confidence and decrease your anxiety.  Practicing relaxation techniques or doing relaxing activities, such as: ? Focused breathing. ? Meditation. ? Visualization. ? Aroma therapy. ? Listening to your favorite music. ? Hypnosis.  Taking a warm shower or bath (hydrotherapy). This may: ? Provide comfort and relaxation. ? Lessen your perception of pain. ? Decrease the amount of pain medicine needed. ? Decrease the length of labor.  Getting a massage or  counterpressure on your back.  Applying warm packs or ice packs.  Changing positions often, moving around, or using a birthing ball.  Getting: ? Pain medicine through an IV or injection into a muscle. ? Pain medicine inserted into your spinal column. ? Injections of sterile water just under the skin on your lower back (intradermal injections). ? Laughing gas (nitrous oxide).  Discuss your pain control options with your health care provider during your prenatal visits. Explore the options offered by your hospital or birth center. What kinds of medicine are available? There are two kinds of medicines that can be used to relieve pain during labor and delivery:  Analgesics. These medicines decrease pain without causing you to lose feeling or the ability to move your muscles.  Anesthetics. These medicines block feeling in the body and can decrease your ability to move freely.  Both of these kinds of medicine can cause minor side effects, such as nausea, trouble concentrating, and sleepiness. They can also decrease the baby's heart rate before birth and affect the baby's breathing rate after birth. For this reason, health care providers are careful about when and how much medicine is given. What are specific medicines and procedures that provide pain relief? Local Anesthetics Local anesthetics are used to numb a small area of the body. They may be used along with another kind of anesthetic or used to numb the nerves  of the vagina, cervix, and perineum during the second stage of labor. General Anesthetics General anesthetics cause you to lose consciousness so you do not feel pain. They are usually only used for an emergency cesarean delivery. General anesthetics are given through an IV tube and a mask. Pudendal Block A pudendal block is a form of local anesthetic. It may be used to relieve the pain associated with pushing or stretching of the perineum at the time of delivery or to further numb  the perineum. A pudendal block is done by injecting numbing medicine through the vaginal wall into a nerve in the pelvis. Epidural Analgesia Epidural analgesia is given through a flexible IV catheter that is inserted into the lower back. Numbing medicine is delivered continuously to the area near your spinal column nerves (epidural space). After having this type of analgesia, you may be able to move your legs but you most likely will not be able to walk. Depending on the amount of medicine given, you may lose all feeling in the lower half of your body, or you may retain some level of sensation, including the urge to push. Epidural analgesia can be used to provide pain relief for a vaginal birth. Spinal Block A spinal block is similar to epidural analgesia, but the medicine is injected into the spinal fluid instead of the epidural space. A spinal block is only given once. It starts to relieve pain quickly, but the pain relief lasts only 1-6 hours. Spinal blocks can be used for cesarean deliveries. Combined Spinal-Epidural (CSE) Block A CSE block combines the effects of a spinal block and epidural analgesia. The spinal block works quickly to block all pain. The epidural analgesia provides continuous pain relief, even after the effects of the spinal block have worn off. This information is not intended to replace advice given to you by your health care provider. Make sure you discuss any questions you have with your health care provider. Document Released: 03/07/2009 Document Revised: 04/27/2016 Document Reviewed: 04/11/2016 Elsevier Interactive Patient Education  2018 [place] of Pregnancy The third trimester is from week 29 through week 42, months 7 through 9. This trimester is when your unborn baby (fetus) is growing very fast. At the end of the ninth month, the unborn baby is about 20 inches in length. It weighs about 6-10 pounds. Follow these instructions at home:  Avoid all  smoking, herbs, and alcohol. Avoid drugs not approved by your doctor.  Do not use any tobacco products, including cigarettes, chewing tobacco, and electronic cigarettes. If you need help quitting, ask your doctor. You may get counseling or other support to help you quit.  Only take medicine as told by your doctor. Some medicines are safe and some are not during pregnancy.  Exercise only as told by your doctor. Stop exercising if you start having cramps.  Eat regular, healthy meals.  Wear a good support bra if your breasts are tender.  Do not use hot tubs, steam rooms, or saunas.  Wear your seat belt when driving.  Avoid raw meat, uncooked cheese, and liter boxes and soil used by cats.  Take your prenatal vitamins.  Take 1500-2000 milligrams of calcium daily starting at the 20th week of pregnancy until you deliver your baby.  Try taking medicine that helps you poop (stool softener) as needed, and if your doctor approves. Eat more fiber by eating fresh fruit, vegetables, and whole grains. Drink enough fluids to keep your pee (urine) clear or pale yellow.  Take warm water baths (sitz baths) to soothe pain or discomfort caused by hemorrhoids. Use hemorrhoid cream if your doctor approves.  If you have puffy, bulging veins (varicose veins), wear support hose. Raise (elevate) your feet for 15 minutes, 3-4 times a day. Limit salt in your diet.  Avoid heavy lifting, wear low heels, and sit up straight.  Rest with your legs raised if you have leg cramps or low back pain.  Visit your dentist if you have not gone during your pregnancy. Use a soft toothbrush to brush your teeth. Be gentle when you floss.  You can have sex (intercourse) unless your doctor tells you not to.  Do not travel far distances unless you must. Only do so with your doctor's approval.  Take prenatal classes.  Practice driving to the hospital.  Pack your hospital bag.  Prepare the baby's room.  Go to your doctor  visits. Get help if:  You are not sure if you are in labor or if your water has broken.  You are dizzy.  You have mild cramps or pressure in your lower belly (abdominal).  You have a nagging pain in your belly area.  You continue to feel sick to your stomach (nauseous), throw up (vomit), or have watery poop (diarrhea).  You have bad smelling fluid coming from your vagina.  You have pain with peeing (urination). Get help right away if:  You have a fever.  You are leaking fluid from your vagina.  You are spotting or bleeding from your vagina.  You have severe belly cramping or pain.  You lose or gain weight rapidly.  You have trouble catching your breath and have chest pain.  You notice sudden or extreme puffiness (swelling) of your face, hands, ankles, feet, or legs.  You have not felt the baby move in over an hour.  You have severe headaches that do not go away with medicine.  You have vision changes. This information is not intended to replace advice given to you by your health care provider. Make sure you discuss any questions you have with your health care provider. Document Released: 02/13/2010 Document Revised: 04/26/2016 Document Reviewed: 01/20/2013 Elsevier Interactive Patient Education  2017 [employer].

## 2018-05-22 NOTE — Progress Notes (Signed)
Pt is here for ROB visit.Screening 7

## 2018-05-23 LAB — CBC
Hematocrit: 37.8 % (ref 34.0–46.6)
Hemoglobin: 12.3 g/dL (ref 11.1–15.9)
MCH: 30.4 pg (ref 26.6–33.0)
MCHC: 32.5 g/dL (ref 31.5–35.7)
MCV: 93 fL (ref 79–97)
PLATELETS: 279 10*3/uL (ref 150–450)
RBC: 4.05 x10E6/uL (ref 3.77–5.28)
RDW: 13.4 % (ref 12.3–15.4)
WBC: 18.2 10*3/uL — ABNORMAL HIGH (ref 3.4–10.8)

## 2018-05-23 LAB — RPR: RPR Ser Ql: NONREACTIVE

## 2018-05-23 LAB — GLUCOSE, 1 HOUR GESTATIONAL: Gestational Diabetes Screen: 73 mg/dL (ref 65–139)

## 2018-05-23 NOTE — Progress Notes (Signed)
ROB-Doing well, questions regarding upcoming beach trip. Discussed travel precautions in pregnancy. 28 week labs today. TDaP and Rhogam given. Blood transfusion consent reviewed and signed. [facility]. Third trimester education; see handouts. Plans condoms as postpartum contraception. No longer taking Zoloft, PHQ-9:7. Reviewed red flag symptoms and when to call. RTC x 2 weeks for ROB or sooner if needed.

## 2018-06-04 ENCOUNTER — Other Ambulatory Visit: Payer: Self-pay

## 2018-06-04 DIAGNOSIS — Z3492 Encounter for supervision of normal pregnancy, unspecified, second trimester: Secondary | ICD-10-CM

## 2018-06-06 ENCOUNTER — Encounter: Payer: Self-pay | Admitting: Certified Nurse Midwife

## 2018-06-06 ENCOUNTER — Ambulatory Visit (INDEPENDENT_AMBULATORY_CARE_PROVIDER_SITE_OTHER): Payer: BC Managed Care – PPO | Admitting: Certified Nurse Midwife

## 2018-06-06 VITALS — BP 91/58 | HR 113 | Wt 139.9 lb

## 2018-06-06 DIAGNOSIS — Z3483 Encounter for supervision of other normal pregnancy, third trimester: Secondary | ICD-10-CM | POA: Diagnosis not present

## 2018-06-06 LAB — POCT URINALYSIS DIPSTICK
BILIRUBIN UA: NEGATIVE
Blood, UA: NEGATIVE
GLUCOSE UA: NEGATIVE
KETONES UA: NEGATIVE
Nitrite, UA: NEGATIVE
Odor: NEGATIVE
PH UA: 8 (ref 5.0–8.0)
Protein, UA: NEGATIVE
Spec Grav, UA: <=1.005 — AB (ref 1.010–1.025)
Urobilinogen, UA: 0.2 E.U./dL

## 2018-06-06 NOTE — Patient Instructions (Signed)

## 2018-06-06 NOTE — Progress Notes (Signed)
ROB, doing well. No complaints. Feels good movement. Denies contractions. Follow up 2 wks.   Doreene BurkeAnnie Satara Virella, CNM

## 2018-06-06 NOTE — Progress Notes (Signed)
ROB- no complaints.  

## 2018-06-19 ENCOUNTER — Ambulatory Visit (INDEPENDENT_AMBULATORY_CARE_PROVIDER_SITE_OTHER): Payer: BC Managed Care – PPO | Admitting: Certified Nurse Midwife

## 2018-06-19 VITALS — BP 100/62 | HR 102 | Wt 142.1 lb

## 2018-06-19 DIAGNOSIS — Z3403 Encounter for supervision of normal first pregnancy, third trimester: Secondary | ICD-10-CM | POA: Diagnosis not present

## 2018-06-19 LAB — POCT URINALYSIS DIPSTICK
BILIRUBIN UA: NEGATIVE
Glucose, UA: NEGATIVE
KETONES UA: NEGATIVE
Leukocytes, UA: NEGATIVE
NITRITE UA: NEGATIVE
PH UA: 7 (ref 5.0–8.0)
PROTEIN UA: NEGATIVE
RBC UA: NEGATIVE
SPEC GRAV UA: 1.01 (ref 1.010–1.025)
UROBILINOGEN UA: 0.2 U/dL

## 2018-06-19 NOTE — Progress Notes (Signed)
Pt is here for an ROB visit. 

## 2018-06-19 NOTE — Patient Instructions (Signed)
Vaginal Delivery Vaginal delivery means that you will give birth by pushing your baby out of your birth canal (vagina). A team of health care providers will help you before, during, and after vaginal delivery. Birth experiences are unique for every woman and every pregnancy, and birth experiences vary depending on where you choose to give birth. What should I do to prepare for my baby's birth? Before your baby is born, it is important to talk with your health care provider about:  Your labor and delivery preferences. These may include: ? Medicines that you may be given. ? How you will manage your pain. This might include non-medical pain relief techniques or injectable pain relief such as epidural analgesia. ? How you and your baby will be monitored during labor and delivery. ? Who may be in the labor and delivery room with you. ? Your feelings about surgical delivery of your baby (cesarean delivery, or C-section) if this becomes necessary. ? Your feelings about receiving donated blood through an IV tube (blood transfusion) if this becomes necessary.  Whether you are able: ? To take pictures or videos of the birth. ? To eat during labor and delivery. ? To move around, walk, or change positions during labor and delivery.  What to expect after your baby is born, such as: ? Whether delayed umbilical cord clamping and cutting is offered. ? Who will care for your baby right after birth. ? Medicines or tests that may be recommended for your baby. ? Whether breastfeeding is supported in your hospital or birth center. ? How long you will be in the hospital or birth center.  How any medical conditions you have may affect your baby or your labor and delivery experience.  To prepare for your baby's birth, you should also:  Attend all of your health care visits before delivery (prenatal visits) as recommended by your health care provider. This is important.  Prepare your home for your baby's  arrival. Make sure that you have: ? Diapers. ? Baby clothing. ? Feeding equipment. ? Safe sleeping arrangements for you and your baby.  Install a car seat in your vehicle. Have your car seat checked by a certified car seat installer to make sure that it is installed safely.  Think about who will help you with your new baby at home for at least the first several weeks after delivery.  What can I expect when I arrive at the birth center or hospital? Once you are in labor and have been admitted into the hospital or birth center, your health care provider may:  Review your pregnancy history and any concerns you have.  Insert an IV tube into one of your veins. This is used to give you fluids and medicines.  Check your blood pressure, pulse, temperature, and heart rate (vital signs).  Check whether your bag of water (amniotic sac) has broken (ruptured).  Talk with you about your birth plan and discuss pain control options.  Monitoring Your health care provider may monitor your contractions (uterine monitoring) and your baby's heart rate (fetal monitoring). You may need to be monitored:  Often, but not continuously (intermittently).  All the time or for long periods at a time (continuously). Continuous monitoring may be needed if: ? You are taking certain medicines, such as medicine to relieve pain or make your contractions stronger. ? You have pregnancy or labor complications.  Monitoring may be done by:  Placing a special stethoscope or a handheld monitoring device on your abdomen to   check your baby's heartbeat, and feeling your abdomen for contractions. This method of monitoring does not continuously record your baby's heartbeat or your contractions.  Placing monitors on your abdomen (external monitors) to record your baby's heartbeat and the frequency and length of contractions. You may not have to wear external monitors all the time.  Placing monitors inside of your uterus  (internal monitors) to record your baby's heartbeat and the frequency, length, and strength of your contractions. ? Your health care provider may use internal monitors if he or she needs more information about the strength of your contractions or your baby's heart rate. ? Internal monitors are put in place by passing a thin, flexible wire through your vagina and into your uterus. Depending on the type of monitor, it may remain in your uterus or on your baby's head until birth. ? Your health care provider will discuss the benefits and risks of internal monitoring with you and will ask for your permission before inserting the monitors.  Telemetry. This is a type of continuous monitoring that can be done with external or internal monitors. Instead of having to stay in bed, you are able to move around during telemetry. Ask your health care provider if telemetry is an option for you.  Physical exam Your health care provider may perform a physical exam. This may include:  Checking whether your baby is positioned: ? With the head toward your vagina (head-down). This is most common. ? With the head toward the top of your uterus (head-up or breech). If your baby is in a breech position, your health care provider may try to turn your baby to a head-down position so you can deliver vaginally. If it does not seem that your baby can be born vaginally, your provider may recommend surgery to deliver your baby. In rare cases, you may be able to deliver vaginally if your baby is head-up (breech delivery). ? Lying sideways (transverse). Babies that are lying sideways cannot be delivered vaginally.  Checking your cervix to determine: ? Whether it is thinning out (effacing). ? Whether it is opening up (dilating). ? How low your baby has moved into your birth canal.  What are the three stages of labor and delivery?  Normal labor and delivery is divided into the following three stages: Stage 1  Stage 1 is the  longest stage of labor, and it can last for hours or days. Stage 1 includes: ? Early labor. This is when contractions may be irregular, or regular and mild. Generally, early labor contractions are more than 10 minutes apart. ? Active labor. This is when contractions get longer, more regular, more frequent, and more intense. ? The transition phase. This is when contractions happen very close together, are very intense, and may last longer than during any other part of labor.  Contractions generally feel mild, infrequent, and irregular at first. They get stronger, more frequent (about every 2-3 minutes), and more regular as you progress from early labor through active labor and transition.  Many women progress through stage 1 naturally, but you may need help to continue making progress. If this happens, your health care provider may talk with you about: ? Rupturing your amniotic sac if it has not ruptured yet. ? Giving you medicine to help make your contractions stronger and more frequent.  Stage 1 ends when your cervix is completely dilated to 4 inches (10 cm) and completely effaced. This happens at the end of the transition phase. Stage 2  Once   your cervix is completely effaced and dilated to 4 inches (10 cm), you may start to feel an urge to push. It is common for the body to naturally take a rest before feeling the urge to push, especially if you received an epidural or certain other pain medicines. This rest period may last for up to 1-2 hours, depending on your unique labor experience.  During stage 2, contractions are generally less painful, because pushing helps relieve contraction pain. Instead of contraction pain, you may feel stretching and burning pain, especially when the widest part of your baby's head passes through the vaginal opening (crowning).  Your health care provider will closely monitor your pushing progress and your baby's progress through the vagina during stage 2.  Your  health care provider may massage the area of skin between your vaginal opening and anus (perineum) or apply warm compresses to your perineum. This helps it stretch as the baby's head starts to crown, which can help prevent perineal tearing. ? In some cases, an incision may be made in your perineum (episiotomy) to allow the baby to pass through the vaginal opening. An episiotomy helps to make the opening of the vagina larger to allow more room for the baby to fit through.  It is very important to breathe and focus so your health care provider can control the delivery of your baby's head. Your health care provider may have you decrease the intensity of your pushing, to help prevent perineal tearing.  After delivery of your baby's head, the shoulders and the rest of the body generally deliver very quickly and without difficulty.  Once your baby is delivered, the umbilical cord may be cut right away, or this may be delayed for 1-2 minutes, depending on your baby's health. This may vary among health care providers, hospitals, and birth centers.  If you and your baby are healthy enough, your baby may be placed on your chest or abdomen to help maintain the baby's temperature and to help you bond with each other. Some mothers and babies start breastfeeding at this time. Your health care team will dry your baby and help keep your baby warm during this time.  Your baby may need immediate care if he or she: ? Showed signs of distress during labor. ? Has a medical condition. ? Was born too early (prematurely). ? Had a bowel movement before birth (meconium). ? Shows signs of difficulty transitioning from being inside the uterus to being outside of the uterus. If you are planning to breastfeed, your health care team will help you begin a feeding. Stage 3  The third stage of labor starts immediately after the birth of your baby and ends after you deliver the placenta. The placenta is an organ that develops  during pregnancy to provide oxygen and nutrients to your baby in the womb.  Delivering the placenta may require some pushing, and you may have mild contractions. Breastfeeding can stimulate contractions to help you deliver the placenta.  After the placenta is delivered, your uterus should tighten (contract) and become firm. This helps to stop bleeding in your uterus. To help your uterus contract and to control bleeding, your health care provider may: ? Give you medicine by injection, through an IV tube, by mouth, or through your rectum (rectally). ? Massage your abdomen or perform a vaginal exam to remove any blood clots that are left in your uterus. ? Empty your bladder by placing a thin, flexible tube (catheter) into your bladder. ? Encourage   you to breastfeed your baby. After labor is over, you and your baby will be monitored closely to ensure that you are both healthy until you are ready to go home. Your health care team will teach you how to care for yourself and your baby. This information is not intended to replace advice given to you by your health care provider. Make sure you discuss any questions you have with your health care provider. Document Released: 08/28/2008 Document Revised: 06/08/2016 Document Reviewed: 12/04/2015 Elsevier Interactive Patient Education  2018 [employer] Inc.  

## 2018-06-21 NOTE — Progress Notes (Signed)
ROB-Reports increased depression symptoms, questions answered regarding medication in the postpartum period. Declines medication and counseling at this time. Enrolled in class. [facility] volunteer doula pamphlet given. Anticipatory guidance regarding the course of prenatal care. Reviewed red flag symptoms and when to call. RTC x 2 weeks for ROB or sooner if needed.

## 2018-06-26 ENCOUNTER — Telehealth: Payer: Self-pay | Admitting: Certified Nurse Midwife

## 2018-06-26 NOTE — Telephone Encounter (Signed)
The patient called and stated that she would like to speak with Marcelino DusterMichelle in regards to her needing to get back on her depression medication. Please advise.

## 2018-06-27 NOTE — Telephone Encounter (Signed)
Needs to come in for PHQ-9 screening before restarting medications. Patient not currently taking any medication for symptoms. Thanks, JML

## 2018-06-30 ENCOUNTER — Encounter: Payer: Self-pay | Admitting: Certified Nurse Midwife

## 2018-06-30 ENCOUNTER — Ambulatory Visit (INDEPENDENT_AMBULATORY_CARE_PROVIDER_SITE_OTHER): Payer: BC Managed Care – PPO | Admitting: Certified Nurse Midwife

## 2018-06-30 VITALS — BP 98/66 | HR 98 | Wt 143.0 lb

## 2018-06-30 DIAGNOSIS — Z3403 Encounter for supervision of normal first pregnancy, third trimester: Secondary | ICD-10-CM | POA: Diagnosis not present

## 2018-06-30 LAB — POCT URINALYSIS DIPSTICK
BILIRUBIN UA: NEGATIVE
GLUCOSE UA: NEGATIVE
KETONES UA: NEGATIVE
Leukocytes, UA: NEGATIVE
Nitrite, UA: NEGATIVE
Protein, UA: NEGATIVE
RBC UA: NEGATIVE
Spec Grav, UA: <=1.005 — AB (ref 1.010–1.025)
Urobilinogen, UA: 0.2 E.U./dL
pH, UA: 7.5 (ref 5.0–8.0)

## 2018-06-30 MED ORDER — SERTRALINE HCL 25 MG PO TABS: 25.0000 mg | ORAL_TABLET | Freq: Every day | ORAL | 0 refills | Status: DC

## 2018-06-30 NOTE — Patient Instructions (Signed)
Depression Screening Depression screening is a tool that your health care provider can use to learn if you have symptoms of depression. Depression is a common condition with many symptoms that are also often found in other conditions. Depression is treatable, but it must first be diagnosed. You may not know that certain feelings, thoughts, and behaviors that you are having can be symptoms of depression. Taking a depression screening test can help you and your health care provider decide if you need more assessment, or if you should be referred to a mental health care provider. What are the screening tests?  You may have a physical exam to see if another condition is affecting your mental health. You may have a blood or urine sample taken during the physical exam.  You may be interviewed using a screening tool that was developed from research, such as one of these: ? Patient Health Questionnaire (PHQ). This is a set of either 2 or 9 questions. A health care provider who has been trained to score this screening test uses a guide to assess if your symptoms suggest that you may have depression. ? Hamilton Depression Rating Scale (HAM-D). This is a set of either 17 or 24 questions. You may be asked to take it again during or after your treatment, to see if your depression has gotten better. ? Beck Depression Inventory (BDI). This is a set of 21 multiple choice questions. Your health care provider scores your answers to assess:  Your level of depression, ranging from mild to severe.  Your response to treatment.  Your health care provider may talk with you about your daily activities, such as eating, sleeping, work, and recreation, and ask if you have had any changes in activity.  Your health care provider may ask you to see a mental health specialist, such as a psychiatrist or psychologist, for more evaluation. Who should be screened for depression?  All adults, including adults with a family history  of a mental health disorder.  Adolescents who are 12-18 years old.  People who are recovering from a myocardial infarction (MI).  Pregnant women, or women who have given birth.  People who have a long-term (chronic) illness.  Anyone who has been diagnosed with another type of a mental health disorder.  Anyone who has symptoms that could show depression. What do my results mean? Your health care provider will review the results of your depression screening, physical exam, and lab tests. Positive screens suggest that you may have depression. Screening is the first step in getting the care that you may need. It is up to you to get your screening results. Ask your health care provider, or the department that is doing your screening tests, when your results will be ready. Talk with your health care provider about your results and diagnosis. A diagnosis of depression is made using the Diagnostic and Statistical Manual of Mental Disorders (DSM-V). This is a book that lists the number and type of symptoms that must be present for a health care provider to give a specific diagnosis.  Your health care provider may work with you to treat your symptoms of depression, or your health care provider may help you find a mental health provider who can assess, diagnose, and treat your depression. Get help right away if:  You have thoughts about hurting yourself or others. If you ever feel like you may hurt yourself or others, or have thoughts about taking your own life, get help right away. You can   go to your nearest emergency department or call:  Your local emergency services (911 in the [place]  A suicide crisis helpline, such as the National Suicide Prevention Lifeline at 1-800-273-8255. This is open 24 hours a day.  Summary  Depression screening is the first step in getting the help that you may need.  If your screening test shows symptoms of depression (is positive), your health care provider may ask  you to see a mental health provider.  Anyone who is age 12 or older should be screened for depression. This information is not intended to replace advice given to you by your health care provider. Make sure you discuss any questions you have with your health care provider. Document Released: 04/05/2017 Document Revised: 04/05/2017 Document Reviewed: 04/05/2017 Elsevier Interactive Patient Education  2018 Elsevier Inc.  

## 2018-06-30 NOTE — Progress Notes (Signed)
ROB, pt complains of anxiety and depression. Says she feels sad on most days. She does not enjoy things. She is tearful today and would like to start medications. She has taken zoloft in the past. Medications sheet given on zoloft in pregnancy. Discussed benefits and risks . She verbalizes understanding and agrees to plan. Discussed that medication will not become therapeutic for 3-4 wks.  Verbal contract today that she will go to ED if she has thoughts of hurting herself. Discussed use of conuseling . She is concerned about ability to pay. Email sent to [employer] [facility] psychotherapistsocial worker to inquire about resources. Follow up 2 wks.   Doreene BurkeAnnie Daina Cara, CNM

## 2018-07-02 ENCOUNTER — Encounter: Payer: BC Managed Care – PPO | Admitting: Certified Nurse Midwife

## 2018-07-14 ENCOUNTER — Other Ambulatory Visit: Payer: Self-pay

## 2018-07-14 ENCOUNTER — Inpatient Hospital Stay
Admission: EM | Admit: 2018-07-14 | Discharge: 2018-07-17 | DRG: 806 | Disposition: A | Discharge status: Home / Self Care | Payer: BC Managed Care – PPO | Source: Ambulatory Visit | Attending: Certified Nurse Midwife | Admitting: Certified Nurse Midwife

## 2018-07-14 ENCOUNTER — Ambulatory Visit (INDEPENDENT_AMBULATORY_CARE_PROVIDER_SITE_OTHER): Payer: BC Managed Care – PPO | Admitting: Certified Nurse Midwife

## 2018-07-14 VITALS — BP 121/75 | HR 102 | Wt 150.0 lb

## 2018-07-14 DIAGNOSIS — O360130 Maternal care for anti-D [Rh] antibodies, third trimester, not applicable or unspecified: Secondary | ICD-10-CM | POA: Diagnosis not present

## 2018-07-14 DIAGNOSIS — F329 Major depressive disorder, single episode, unspecified: Secondary | ICD-10-CM | POA: Diagnosis present

## 2018-07-14 DIAGNOSIS — O26893 Other specified pregnancy related conditions, third trimester: Secondary | ICD-10-CM | POA: Diagnosis present

## 2018-07-14 DIAGNOSIS — O99344 Other mental disorders complicating childbirth: Secondary | ICD-10-CM | POA: Diagnosis present

## 2018-07-14 DIAGNOSIS — D62 Acute posthemorrhagic anemia: Secondary | ICD-10-CM

## 2018-07-14 DIAGNOSIS — Z6791 Unspecified blood type, Rh negative: Secondary | ICD-10-CM | POA: Diagnosis not present

## 2018-07-14 DIAGNOSIS — Z3403 Encounter for supervision of normal first pregnancy, third trimester: Secondary | ICD-10-CM

## 2018-07-14 DIAGNOSIS — Z3A36 36 weeks gestation of pregnancy: Secondary | ICD-10-CM

## 2018-07-14 DIAGNOSIS — O9081 Anemia of the puerperium: Secondary | ICD-10-CM | POA: Diagnosis not present

## 2018-07-14 DIAGNOSIS — O26899 Other specified pregnancy related conditions, unspecified trimester: Secondary | ICD-10-CM

## 2018-07-14 LAB — CBC
HEMATOCRIT: 34.8 % — AB (ref 35.0–47.0)
HEMOGLOBIN: 11.9 g/dL — AB (ref 12.0–16.0)
MCH: 28.8 pg (ref 26.0–34.0)
MCHC: 34.3 g/dL (ref 32.0–36.0)
MCV: 84 fL (ref 80.0–100.0)
Platelets: 308 10*3/uL (ref 150–440)
RBC: 4.14 MIL/uL (ref 3.80–5.20)
RDW: 13.8 % (ref 11.5–14.5)
WBC: 15.9 10*3/uL — AB (ref 3.6–11.0)

## 2018-07-14 LAB — POCT URINALYSIS DIPSTICK
Bilirubin, UA: NEGATIVE
Glucose, UA: NEGATIVE
Ketones, UA: NEGATIVE
LEUKOCYTES UA: NEGATIVE
NITRITE UA: NEGATIVE
PH UA: 7.5 (ref 5.0–8.0)
PROTEIN UA: NEGATIVE
RBC UA: NEGATIVE
Urobilinogen, UA: 0.2 E.U./dL

## 2018-07-14 MED ORDER — OXYTOCIN BOLUS FROM INFUSION
500.0000 mL | Freq: Once | INTRAVENOUS | Status: AC
  Administered 2018-07-15: 500 mL via INTRAVENOUS

## 2018-07-14 MED ORDER — MISOPROSTOL 200 MCG PO TABS
ORAL_TABLET | ORAL | Status: AC
  Filled 2018-07-14: qty 4

## 2018-07-14 MED ORDER — ACETAMINOPHEN 325 MG PO TABS
650.0000 mg | ORAL_TABLET | ORAL | Status: DC | PRN
  Administered 2018-07-14: 650 mg via ORAL
  Filled 2018-07-14: qty 2

## 2018-07-14 MED ORDER — LIDOCAINE HCL (PF) 1 % IJ SOLN
INTRAMUSCULAR | Status: AC
  Filled 2018-07-14: qty 30

## 2018-07-14 MED ORDER — BUTORPHANOL TARTRATE 2 MG/ML IJ SOLN: 1.0000 mg | INTRAMUSCULAR | Status: DC | PRN

## 2018-07-14 MED ORDER — AMPICILLIN SODIUM 2 G IJ SOLR
2.0000 g | Freq: Once | INTRAMUSCULAR | Status: AC
  Administered 2018-07-14: 2 g via INTRAVENOUS
  Filled 2018-07-14: qty 2000

## 2018-07-14 MED ORDER — LACTATED RINGERS IV SOLN: 500.0000 mL | INTRAVENOUS | Status: DC | PRN

## 2018-07-14 MED ORDER — LIDOCAINE HCL (PF) 1 % IJ SOLN
30.0000 mL | INTRAMUSCULAR | Status: DC | PRN
  Administered 2018-07-15: 30 mL via SUBCUTANEOUS

## 2018-07-14 MED ORDER — OXYTOCIN 40 UNITS IN LACTATED RINGERS INFUSION - SIMPLE MED
2.5000 [IU]/h | INTRAVENOUS | Status: DC
  Filled 2018-07-14: qty 1000

## 2018-07-14 MED ORDER — OXYCODONE-ACETAMINOPHEN 5-325 MG PO TABS
2.0000 | ORAL_TABLET | ORAL | Status: DC | PRN
Start: 2018-07-14 — End: 2018-07-15

## 2018-07-14 MED ORDER — LACTATED RINGERS IV SOLN
INTRAVENOUS | Status: DC
  Administered 2018-07-14: 16:00:00 via INTRAVENOUS

## 2018-07-14 MED ORDER — OXYCODONE-ACETAMINOPHEN 5-325 MG PO TABS: 1.0000 | ORAL_TABLET | ORAL | Status: DC | PRN

## 2018-07-14 MED ORDER — OXYTOCIN 10 UNIT/ML IJ SOLN
INTRAMUSCULAR | Status: AC
  Filled 2018-07-14: qty 2

## 2018-07-14 MED ORDER — AMMONIA AROMATIC IN INHA
RESPIRATORY_TRACT | Status: DC
Start: 2018-07-14 — End: 2018-07-15
  Filled 2018-07-14: qty 10

## 2018-07-14 MED ORDER — FLEET ENEMA 7-19 GM/118ML RE ENEM
1.0000 | ENEMA | Freq: Every day | RECTAL | Status: DC | PRN
Start: 2018-07-14 — End: 2018-07-15

## 2018-07-14 MED ORDER — SOD CITRATE-CITRIC ACID 500-334 MG/5ML PO SOLN: 30.0000 mL | ORAL | Status: DC | PRN

## 2018-07-14 MED ORDER — ONDANSETRON HCL 4 MG/2ML IJ SOLN
4.0000 mg | Freq: Four times a day (QID) | INTRAMUSCULAR | Status: DC | PRN
  Administered 2018-07-14: 4 mg via INTRAVENOUS
  Filled 2018-07-14: qty 2

## 2018-07-14 MED ORDER — SODIUM CHLORIDE 0.9 % IV SOLN
1.0000 g | INTRAVENOUS | Status: DC
  Administered 2018-07-14: 1 g via INTRAVENOUS
  Filled 2018-07-14 (×4): qty 1000

## 2018-07-14 NOTE — Addendum Note (Signed)
Addended by: Haskel KhanSMITH, Borna Wessinger M on: 07/14/2018 03:39 PM   Modules accepted: Orders

## 2018-07-14 NOTE — Progress Notes (Signed)
Pt states no issues at this time

## 2018-07-14 NOTE — Progress Notes (Signed)
ROB-Doing well. SVE: 7/80/-2, vertex. 36 week cultures collected. Reviewed red flag symptoms and when to call. Report called to Asher MuirJamie, [employer] Consult[employer] civil engineerCharge RN. On MD notified of admission and plan of care. RTC x 6 weeks for PPV with JML

## 2018-07-14 NOTE — Progress Notes (Signed)
[name], female   DOB: [birth year], 27 y.o.   MRN: [id number]  Tina Rodgers is a 27 y.o. G1P0 at 6528w0d by ultrasound admitted for active labor  Subjective:  Patient sitting in bed, breathing through contractions. Reports intermittent back pain with contractions. Family members at bedside for continuous labor support.   Denies difficulty breathing or respiratory distress, chest pain, dysuria, and leg pain or swelling.   Objective:  Temp:  [98.5 F (36.9 C)] 98.5 F (36.9 C) (08/12 1927) Pulse Rate:  [96-102] 96 (08/12 1927) BP: (119-121)/(65-75) 119/65 (08/12 1927) Weight:  [68 kg] 68 kg (08/12 1328)  Fetal Wellbeing:  Category II  UC:   regular, every three (3) to five (5) minutes; soft resting tone  SVE:   Dilation: 7 Effacement (%): 90 Station: -1 Exam by:: Maximino GreenlandM Trogdon RN  Labs: Lab Results  Component Value Date   WBC 15.9 (H) 07/14/2018   HGB 11.9 (L) 07/14/2018   HCT 34.8 (L) 07/14/2018   MCV 84.0 07/14/2018   PLT 308 07/14/2018    Assessment:  Tina Rodgers is a 27 y.o. G1P0 at 6228w0d being admitted for labor, Rh negative, GBS unknown, plans natural childbirth   FHR Category II  Plan:  Discussed actual verses expected labor progress. Education regarding options including position change, pitocin augmentation and pain management techniques.   Consultation with Dr. Logan BoresEvans regarding persistent occiput posterior position and interventions options. Discussed pitocin augmentation, epidural anesthesia, and/or offering the patient a cesarean section.   Will review options with patient and proceed with her choice. Plan use of rebozo for repositioning of fetus.   Continue orders as written. Reassess as needed.    Gunnar BullaJenkins Michelle Khalaya Mcgurn, CNM [facility], [facility] 07/14/2018, 9:32 PM

## 2018-07-14 NOTE — Patient Instructions (Signed)
Vaginal Delivery Vaginal delivery means that you will give birth by pushing your baby out of your birth canal (vagina). A team of health care providers will help you before, during, and after vaginal delivery. Birth experiences are unique for every woman and every pregnancy, and birth experiences vary depending on where you choose to give birth. What should I do to prepare for my baby's birth? Before your baby is born, it is important to talk with your health care provider about:  Your labor and delivery preferences. These may include: ? Medicines that you may be given. ? How you will manage your pain. This might include non-medical pain relief techniques or injectable pain relief such as epidural analgesia. ? How you and your baby will be monitored during labor and delivery. ? Who may be in the labor and delivery room with you. ? Your feelings about surgical delivery of your baby (cesarean delivery, or C-section) if this becomes necessary. ? Your feelings about receiving donated blood through an IV tube (blood transfusion) if this becomes necessary.  Whether you are able: ? To take pictures or videos of the birth. ? To eat during labor and delivery. ? To move around, walk, or change positions during labor and delivery.  What to expect after your baby is born, such as: ? Whether delayed umbilical cord clamping and cutting is offered. ? Who will care for your baby right after birth. ? Medicines or tests that may be recommended for your baby. ? Whether breastfeeding is supported in your hospital or birth center. ? How long you will be in the hospital or birth center.  How any medical conditions you have may affect your baby or your labor and delivery experience.  To prepare for your baby's birth, you should also:  Attend all of your health care visits before delivery (prenatal visits) as recommended by your health care provider. This is important.  Prepare your home for your baby's  arrival. Make sure that you have: ? Diapers. ? Baby clothing. ? Feeding equipment. ? Safe sleeping arrangements for you and your baby.  Install a car seat in your vehicle. Have your car seat checked by a certified car seat installer to make sure that it is installed safely.  Think about who will help you with your new baby at home for at least the first several weeks after delivery.  What can I expect when I arrive at the birth center or hospital? Once you are in labor and have been admitted into the hospital or birth center, your health care provider may:  Review your pregnancy history and any concerns you have.  Insert an IV tube into one of your veins. This is used to give you fluids and medicines.  Check your blood pressure, pulse, temperature, and heart rate (vital signs).  Check whether your bag of water (amniotic sac) has broken (ruptured).  Talk with you about your birth plan and discuss pain control options.  Monitoring Your health care provider may monitor your contractions (uterine monitoring) and your baby's heart rate (fetal monitoring). You may need to be monitored:  Often, but not continuously (intermittently).  All the time or for long periods at a time (continuously). Continuous monitoring may be needed if: ? You are taking certain medicines, such as medicine to relieve pain or make your contractions stronger. ? You have pregnancy or labor complications.  Monitoring may be done by:  Placing a special stethoscope or a handheld monitoring device on your abdomen to   check your baby's heartbeat, and feeling your abdomen for contractions. This method of monitoring does not continuously record your baby's heartbeat or your contractions.  Placing monitors on your abdomen (external monitors) to record your baby's heartbeat and the frequency and length of contractions. You may not have to wear external monitors all the time.  Placing monitors inside of your uterus  (internal monitors) to record your baby's heartbeat and the frequency, length, and strength of your contractions. ? Your health care provider may use internal monitors if he or she needs more information about the strength of your contractions or your baby's heart rate. ? Internal monitors are put in place by passing a thin, flexible wire through your vagina and into your uterus. Depending on the type of monitor, it may remain in your uterus or on your baby's head until birth. ? Your health care provider will discuss the benefits and risks of internal monitoring with you and will ask for your permission before inserting the monitors.  Telemetry. This is a type of continuous monitoring that can be done with external or internal monitors. Instead of having to stay in bed, you are able to move around during telemetry. Ask your health care provider if telemetry is an option for you.  Physical exam Your health care provider may perform a physical exam. This may include:  Checking whether your baby is positioned: ? With the head toward your vagina (head-down). This is most common. ? With the head toward the top of your uterus (head-up or breech). If your baby is in a breech position, your health care provider may try to turn your baby to a head-down position so you can deliver vaginally. If it does not seem that your baby can be born vaginally, your provider may recommend surgery to deliver your baby. In rare cases, you may be able to deliver vaginally if your baby is head-up (breech delivery). ? Lying sideways (transverse). Babies that are lying sideways cannot be delivered vaginally.  Checking your cervix to determine: ? Whether it is thinning out (effacing). ? Whether it is opening up (dilating). ? How low your baby has moved into your birth canal.  What are the three stages of labor and delivery?  Normal labor and delivery is divided into the following three stages: Stage 1  Stage 1 is the  longest stage of labor, and it can last for hours or days. Stage 1 includes: ? Early labor. This is when contractions may be irregular, or regular and mild. Generally, early labor contractions are more than 10 minutes apart. ? Active labor. This is when contractions get longer, more regular, more frequent, and more intense. ? The transition phase. This is when contractions happen very close together, are very intense, and may last longer than during any other part of labor.  Contractions generally feel mild, infrequent, and irregular at first. They get stronger, more frequent (about every 2-3 minutes), and more regular as you progress from early labor through active labor and transition.  Many women progress through stage 1 naturally, but you may need help to continue making progress. If this happens, your health care provider may talk with you about: ? Rupturing your amniotic sac if it has not ruptured yet. ? Giving you medicine to help make your contractions stronger and more frequent.  Stage 1 ends when your cervix is completely dilated to 4 inches (10 cm) and completely effaced. This happens at the end of the transition phase. Stage 2  Once   your cervix is completely effaced and dilated to 4 inches (10 cm), you may start to feel an urge to push. It is common for the body to naturally take a rest before feeling the urge to push, especially if you received an epidural or certain other pain medicines. This rest period may last for up to 1-2 hours, depending on your unique labor experience.  During stage 2, contractions are generally less painful, because pushing helps relieve contraction pain. Instead of contraction pain, you may feel stretching and burning pain, especially when the widest part of your baby's head passes through the vaginal opening (crowning).  Your health care provider will closely monitor your pushing progress and your baby's progress through the vagina during stage 2.  Your  health care provider may massage the area of skin between your vaginal opening and anus (perineum) or apply warm compresses to your perineum. This helps it stretch as the baby's head starts to crown, which can help prevent perineal tearing. ? In some cases, an incision may be made in your perineum (episiotomy) to allow the baby to pass through the vaginal opening. An episiotomy helps to make the opening of the vagina larger to allow more room for the baby to fit through.  It is very important to breathe and focus so your health care provider can control the delivery of your baby's head. Your health care provider may have you decrease the intensity of your pushing, to help prevent perineal tearing.  After delivery of your baby's head, the shoulders and the rest of the body generally deliver very quickly and without difficulty.  Once your baby is delivered, the umbilical cord may be cut right away, or this may be delayed for 1-2 minutes, depending on your baby's health. This may vary among health care providers, hospitals, and birth centers.  If you and your baby are healthy enough, your baby may be placed on your chest or abdomen to help maintain the baby's temperature and to help you bond with each other. Some mothers and babies start breastfeeding at this time. Your health care team will dry your baby and help keep your baby warm during this time.  Your baby may need immediate care if he or she: ? Showed signs of distress during labor. ? Has a medical condition. ? Was born too early (prematurely). ? Had a bowel movement before birth (meconium). ? Shows signs of difficulty transitioning from being inside the uterus to being outside of the uterus. If you are planning to breastfeed, your health care team will help you begin a feeding. Stage 3  The third stage of labor starts immediately after the birth of your baby and ends after you deliver the placenta. The placenta is an organ that develops  during pregnancy to provide oxygen and nutrients to your baby in the womb.  Delivering the placenta may require some pushing, and you may have mild contractions. Breastfeeding can stimulate contractions to help you deliver the placenta.  After the placenta is delivered, your uterus should tighten (contract) and become firm. This helps to stop bleeding in your uterus. To help your uterus contract and to control bleeding, your health care provider may: ? Give you medicine by injection, through an IV tube, by mouth, or through your rectum (rectally). ? Massage your abdomen or perform a vaginal exam to remove any blood clots that are left in your uterus. ? Empty your bladder by placing a thin, flexible tube (catheter) into your bladder. ? Encourage   you to breastfeed your baby. After labor is over, you and your baby will be monitored closely to ensure that you are both healthy until you are ready to go home. Your health care team will teach you how to care for yourself and your baby. This information is not intended to replace advice given to you by your health care provider. Make sure you discuss any questions you have with your health care provider. Document Released: 08/28/2008 Document Revised: 06/08/2016 Document Reviewed: 12/04/2015 Elsevier Interactive Patient Education  2018 [employer] Inc.  

## 2018-07-14 NOTE — Progress Notes (Signed)
[name], female   DOB: Nov 18, 1991, 27 y.o.   MRN: [id number]  Obstetric History and Physical  Lauralyn PrimesKathryn Birchett Dimas Rodgers is a 27 y.o. G1P0 with IUP at 7456w0d presenting from office for direct admission due to SVE: 7/90/BBOW.   Patient states she has been having irregular contractions, minimal vaginal bleeding, intact membranes, with active fetal movement.    Denies difficulty breathing or respiratory distress, chest pain, abdominal pain, dysuria, and leg pain or swelling.   Prenatal Course  Source of Care: [facility]-initial visit at 9 weeks, total visits: 10  Pregnancy complications or risks: History ADHD, Rh negative, Depression in pregnancy-taking Zoloft  Prenatal labs and studies:  ABO, Rh: --/--/A NEG (08/12 1624)  Antibody: POS (08/12 1624)  Rubella: 1.88 (02/04 1456)  Varicella: 2, 185 (02/04 1456)  RPR: Non Reactive (06/20 1039)   HBsAg: Negative (02/04 1456)   HIV: Non Reactive (02/04 1456)   GBS: Unknown, collect in office today  1 hr Glucola: 73 (06/20 1039)  Genetic screening: Low risk female (02/04 1451)  Anatomy US: Normal, complete (04/22 1458)  Past Medical History:  Diagnosis Date  . ADD (attention deficit disorder)   . Vitamin D deficiency     Past Surgical History:  Procedure Laterality Date  . BREAST SURGERY     2 lumps removed- neg    OB History  Gravida Para Term Preterm AB Living  1            SAB TAB Ectopic Multiple Live Births               # Outcome Date GA Lbr Len/2nd Weight Sex Delivery Anes PTL Lv  1 Current             Social History   Socioeconomic History  . Marital status: Single    Spouse name: Not on file  . Number of children: Not on file  . Years of education: Not on file  . Highest education level: Not on file  Occupational History  . Not on file  Social Needs  . Financial resource strain: Not on file  . Food insecurity:    Worry: Not on file    Inability: Not on file  . Transportation  needs:    Medical: Not on file    Non-medical: Not on file  Tobacco Use  . Smoking status: Never Smoker  . Smokeless tobacco: Never Used  Substance and Sexual Activity  . Alcohol use: Yes    Frequency: Never    Comment: 3-4 x week  . Drug use: No  . Sexual activity: Yes    Birth control/protection: None  Lifestyle  . Physical activity:    Days per week: Not on file    Minutes per session: Not on file  . Stress: Not on file  Relationships  . Social connections:    Talks on phone: Not on file    Gets together: Not on file    Attends religious service: Not on file    Active member of club or organization: Not on file    Attends meetings of clubs or organizations: Not on file    Relationship status: Not on file  Other Topics Concern  . Not on file  Social History Narrative  . Not on file    Family History  Problem Relation Age of Onset  . Ovarian cancer Paternal Grandmother   . Breast cancer Neg Hx   . Colon cancer Neg Hx   .  Diabetes Neg Hx     Medications Prior to Admission  Medication Sig Dispense Refill Last Dose  . Prenat-Fe Poly-Methfol-FA-DHA (VITAFOL ULTRA) 29-0.6-0.4-200 MG CAPS Take 29 mg by mouth daily. 30 capsule 11 Taking  . sertraline (ZOLOFT) 25 MG tablet Take 1 tablet (25 mg total) by mouth daily. 30 tablet 0 Taking    Allergies  Allergen Reactions  . Sulfa Antibiotics Other (See Comments)    flush    Review of Systems: Negative except for what is mentioned in HPI.  Physical Exam:  Pulse Rate:  [102] 102 (08/12 1328) BP: (121)/(75) 121/75 (08/12 1328) Weight:  [68 kg] 68 kg (08/12 1328)  GENERAL: Well-developed, well-nourished female in no acute distress.   LUNGS: Clear to auscultation bilaterally.   HEART: Regular rate and rhythm.  ABDOMEN: Soft, nontender, nondistended, gravid.  EXTREMITIES: Nontender, no edema, 2+ distal pulses.  Cervical Exam: Dilation: 7 Effacement (%): 90 Cervical Position: Posterior Station: -2 Presentation:  Vertex Exam by:: M. Aylene Acoff,CNM AROM forebag: clear small amount  FHT:  Baseline rate 150 bpm   Variability moderate  Accelerations present   Decelerations none  Contractions: Every two (2) to three (3) minutes, soft resting tone   Pertinent Labs/Studies:    Results for orders placed or performed during the hospital encounter of 07/14/18 (from the past 24 hour(s))  CBC     Status: Abnormal   Collection Time: 07/14/18  3:10 PM  Result Value Ref Range   WBC 15.9 (H) 3.6 - 11.0 K/uL   RBC 4.14 3.80 - 5.20 MIL/uL   Hemoglobin 11.9 (L) 12.0 - 16.0 g/dL   HCT 16.134.8 (L) 09.635.0 - 04.547.0 %   MCV 84.0 80.0 - 100.0 fL   MCH 28.8 26.0 - 34.0 pg   MCHC 34.3 32.0 - 36.0 g/dL   RDW 40.913.8 81.111.5 - 91.414.5 %   Platelets 308 150 - 440 K/uL  Type and screen     Status: None (Preliminary result)   Collection Time: 07/14/18  4:24 PM  Result Value Ref Range   ABO/RH(D) A NEG    Antibody Screen POS    Sample Expiration      07/17/2018 Performed at [facility], [address] [place], [place] [address]    Antibody Identification PENDING     Assessment :  Tina Rodgers is a 27 y.o. G1P0 at 3143w0d being admitted for labor, Rh negative, GBS unknown, plans natural childbirth   FHR Category I  Plan:  Admit to birthing suites, see orders.   Labor: Expectant management.    Induction/Augmentation as needed, per protocol.  GBS swab collected in office. Will treatment until result available.   Delivery plan: Hopeful for vaginal delivery.  Dr. Logan BoresEvans notified of admission and plan of care.    Gunnar BullaJenkins Michelle Carmin Dibartolo, CNM [facility], [facility]

## 2018-07-15 DIAGNOSIS — Z3A36 36 weeks gestation of pregnancy: Secondary | ICD-10-CM

## 2018-07-15 DIAGNOSIS — O360130 Maternal care for anti-D [Rh] antibodies, third trimester, not applicable or unspecified: Secondary | ICD-10-CM

## 2018-07-15 DIAGNOSIS — D62 Acute posthemorrhagic anemia: Secondary | ICD-10-CM

## 2018-07-15 LAB — CBC
HCT: 28.2 % — ABNORMAL LOW (ref 35.0–47.0)
Hemoglobin: 9.5 g/dL — ABNORMAL LOW (ref 12.0–16.0)
MCH: 28.4 pg (ref 26.0–34.0)
MCHC: 33.7 g/dL (ref 32.0–36.0)
MCV: 84.4 fL (ref 80.0–100.0)
PLATELETS: 275 10*3/uL (ref 150–440)
RBC: 3.34 MIL/uL — AB (ref 3.80–5.20)
RDW: 13.9 % (ref 11.5–14.5)
WBC: 24.5 10*3/uL — ABNORMAL HIGH (ref 3.6–11.0)

## 2018-07-15 LAB — TYPE AND SCREEN
ABO/RH(D): A NEG
Antibody Screen: POSITIVE
Unit division: 0
Unit division: 0

## 2018-07-15 LAB — BPAM RBC
Blood Product Expiration Date: 201908222359
Blood Product Expiration Date: 201908272359
Unit Type and Rh: 600
Unit Type and Rh: 600

## 2018-07-15 LAB — SYPHILIS: RPR W/REFLEX TO RPR TITER AND TREPONEMAL ANTIBODIES, TRADITIONAL SCREENING AND DIAGNOSIS ALGORITHM: RPR Ser Ql: NONREACTIVE

## 2018-07-15 LAB — ABO/RH: ABO/RH(D): A NEG

## 2018-07-15 MED ORDER — SODIUM CHLORIDE 0.9% FLUSH
3.0000 mL | Freq: Two times a day (BID) | INTRAVENOUS | Status: DC
  Administered 2018-07-15 – 2018-07-16 (×2): 3 mL via INTRAVENOUS

## 2018-07-15 MED ORDER — PRENATAL MULTIVITAMIN CH
1.0000 | ORAL_TABLET | Freq: Every day | ORAL | Status: DC
  Administered 2018-07-15 – 2018-07-17 (×2): 1 via ORAL
  Filled 2018-07-15 (×2): qty 1

## 2018-07-15 MED ORDER — SODIUM CHLORIDE 0.9 % IV SOLN: 250.0000 mL | INTRAVENOUS | Status: DC | PRN

## 2018-07-15 MED ORDER — SIMETHICONE 80 MG PO CHEW: 80.0000 mg | CHEWABLE_TABLET | ORAL | Status: DC | PRN

## 2018-07-15 MED ORDER — BENZOCAINE-MENTHOL 20-0.5 % EX AERO
1.0000 "application " | INHALATION_SPRAY | CUTANEOUS | Status: DC | PRN
  Administered 2018-07-17: 1 via TOPICAL
  Filled 2018-07-15 (×2): qty 56

## 2018-07-15 MED ORDER — COCONUT OIL OIL
1.0000 "application " | TOPICAL_OIL | Status: DC | PRN
  Administered 2018-07-15: 1 via TOPICAL
  Filled 2018-07-15: qty 120

## 2018-07-15 MED ORDER — ONDANSETRON HCL 4 MG/2ML IJ SOLN
4.0000 mg | INTRAMUSCULAR | Status: DC | PRN
  Administered 2018-07-15: 4 mg via INTRAVENOUS
  Filled 2018-07-15: qty 2

## 2018-07-15 MED ORDER — SENNOSIDES-DOCUSATE SODIUM 8.6-50 MG PO TABS
2.0000 | ORAL_TABLET | ORAL | Status: DC
  Administered 2018-07-16 – 2018-07-17 (×2): 2 via ORAL
  Filled 2018-07-15 (×3): qty 2

## 2018-07-15 MED ORDER — MAGNESIUM OXIDE 400 (241.3 MG) MG PO TABS
400.0000 mg | ORAL_TABLET | Freq: Every day | ORAL | Status: DC
  Administered 2018-07-15 – 2018-07-17 (×3): 400 mg via ORAL
  Filled 2018-07-15 (×4): qty 1

## 2018-07-15 MED ORDER — SERTRALINE HCL 25 MG PO TABS
25.0000 mg | ORAL_TABLET | Freq: Every day | ORAL | Status: DC
  Administered 2018-07-15 – 2018-07-16 (×2): 25 mg via ORAL
  Filled 2018-07-15 (×4): qty 1

## 2018-07-15 MED ORDER — SODIUM CHLORIDE 0.9% FLUSH: 3.0000 mL | INTRAVENOUS | Status: DC | PRN

## 2018-07-15 MED ORDER — POLYSACCHARIDE IRON COMPLEX 150 MG PO CAPS
150.0000 mg | ORAL_CAPSULE | Freq: Every day | ORAL | Status: DC
  Administered 2018-07-15 – 2018-07-17 (×3): 150 mg via ORAL
  Filled 2018-07-15 (×3): qty 1

## 2018-07-15 MED ORDER — DIPHENHYDRAMINE HCL 25 MG PO CAPS: 25.0000 mg | ORAL_CAPSULE | Freq: Four times a day (QID) | ORAL | Status: DC | PRN

## 2018-07-15 MED ORDER — DIBUCAINE 1 % RE OINT: 1.0000 "application " | TOPICAL_OINTMENT | RECTAL | Status: DC | PRN

## 2018-07-15 MED ORDER — ACETAMINOPHEN 325 MG PO TABS: 650.0000 mg | ORAL_TABLET | ORAL | Status: DC | PRN

## 2018-07-15 MED ORDER — IBUPROFEN 600 MG PO TABS
600.0000 mg | ORAL_TABLET | Freq: Four times a day (QID) | ORAL | Status: DC
  Administered 2018-07-15 – 2018-07-17 (×9): 600 mg via ORAL
  Filled 2018-07-15 (×11): qty 1

## 2018-07-15 MED ORDER — ONDANSETRON HCL 4 MG PO TABS: 4.0000 mg | ORAL_TABLET | ORAL | Status: DC | PRN

## 2018-07-15 MED ORDER — METHYLERGONOVINE MALEATE 0.2 MG PO TABS
0.2000 mg | ORAL_TABLET | ORAL | Status: DC | PRN
  Filled 2018-07-15: qty 1

## 2018-07-15 MED ORDER — WITCH HAZEL-GLYCERIN EX PADS: 1.0000 "application " | MEDICATED_PAD | CUTANEOUS | Status: DC | PRN

## 2018-07-15 MED ORDER — METHYLERGONOVINE MALEATE 0.2 MG/ML IJ SOLN: 0.2000 mg | INTRAMUSCULAR | Status: DC | PRN

## 2018-07-15 NOTE — Lactation Note (Signed)
This note was copied from a baby's chart. Lactation Consultation Note  Patient Name: Tina Valinda HoarKathryn Rodgers JYNWG'NToday's Date: 07/15/2018    I had worked with this  1636 week gest age baby and Mom at the 10:00 and 12:30 feeds today. Baby was sleepy at first, so I had parents try skin to skin (parents said it had not been done since birth). I did note that on finger exam, she bites down with her gums. Palate is WNL. Frenulum under tongue is quite tight and attaches near base of lower gum. Poor lateral movement. A couple times, she was able to stretch her tongue to the gumline. Mom has breasts and nipples that are within normal limits.   At 1000, We tried a nipple shield briefly when she started to cue while skin to skin, but she still pinched mom's nipple and only sucked a couple times. Dad then held baby skin to skin while mom hand expressed 3 ml. I finger fed that to baby. Her suck improved with that fluid to work on.  She then went to sleep.   At 1230, she was a little more alert. I tried nipple shield after inserting a few mls of donor milk into shield (consent obtained by RN). I finger fed her 5 ml of donor milk which was taken better than the 10 feed. I offered her more from a slow flow bottle which she refused. She was spitting up a little so I left the other 5 ml in bottle for parents to offer to her within the hour if possible. Mom to pump and hand express in lieu of breastfeeding.   Baby Tina Rodgers had no jitters and had good body tone. Blood sugars and temps were all WNL (one blood sugar in 6o's). Will continue to assist, but progress was  being made.    Maternal Data    Feeding Feeding Type: Breast Fed  LATCH Score Latch: Repeated attempts needed to sustain latch, nipple held in mouth throughout feeding, stimulation needed to elicit sucking reflex.(assisted by Tina Rodgers CNM; pinched nipple)  Audible Swallowing: None  Type of Nipple: Everted at rest and after stimulation  Comfort  (Breast/Nipple): Filling, red/small blisters or bruises, mild/mod discomfort(pinched and sore nipple)  Hold (Positioning): Full assist, staff holds infant at breast  LATCH Score: 4  Interventions Interventions: Assisted with latch;Skin to skin;Adjust position;Support pillows;Position options;Expressed milk;DEBP(HDM inserted in shield; still poor latch; finger fed)  Lactation Tools Discussed/Used     Consult Status      Sunday CornSandra Clark Oaklee Esther 07/15/2018, 3:58 PM

## 2018-07-15 NOTE — Progress Notes (Signed)
Patient and husband escorted to SCN to visit baby. Patient is feeling good just tired. Patient has been working with lactation today and now able to pump independently.

## 2018-07-15 NOTE — Progress Notes (Addendum)
[name], female   DOB: [birth year], 27 y.o.   MRN: [id number]  Post Partum Day # 0, s/p spontaneous vaginal birth with repaired second degree laceration  Subjective:  Patient sitting in bed, preparing to feed infant. Feeling good overall. FOB and family members at bedside.   Denies difficulty breathing or respiratory distress, chest pain, abdominal pain, excessive vaginal bleeding, dysuria, and leg pain or swelling.   Objective:  Temp:  [97.7 F (36.5 C)-98.5 F (36.9 C)] 98.1 F (36.7 C) (08/13 0812) Pulse Rate:  [83-123] 83 (08/13 0812) Resp:  [18] 18 (08/13 0812) BP: (85-121)/(53-75) 102/62 (08/13 0812) SpO2:  [98 %-100 %] 98 % (08/13 0812) Weight:  [68 kg] 68 kg (08/12 1328)  Physical Exam:   General: alert and cooperative   Lungs: clear to auscultation bilaterally  Breasts: normal appearance, no masses or tenderness  Heart: normal apical impulse  Abdomen: soft, non-tender; bowel sounds normal; no masses,  no organomegaly  Pelvis: Lochia: appropriate, Uterine Fundus: firm  Extremities: DVT Evaluation: No evidence of DVT seen on physical exam.   Recent Labs    07/14/18 1510 07/15/18 0549  HGB 11.9* 9.5*  HCT 34.8* 28.2*    Assessment:  27 y.o. G1P1, postpartum day #0, s/p spontaneous vaginal birth, Rh negative, GBS unknown, blood loss anemia  Breastfeeding  Plan:  Assist patient with placement of nipple shield and latching newborn to right breast  Iron and magnesium supplementation  Reviewed red flag symptoms and when to call  Continue orders as written. Reassess as needed   LOS: 1 day    Gunnar BullaJenkins [name], CNM [facility], [facility] 07/15/2018 12:12 PM

## 2018-07-15 NOTE — Progress Notes (Signed)
[name], female   DOB: 1990-12-27, 27 y.o.   MRN: [id number]  Tina Rodgers is a 27 y.o. G1P0 at 439w1d by LMP admitted for active labor  Subjective:  Patient tired and apologizing after two (2) hours of pushing. Would like to discuss options for birth at this point. Family members at bedside for continuous labor support.   Denies difficulty breathing or respiratory distress, chest pain, dysuria, and leg pain or swelling.   Objective:  Temp:  [98.5 F (36.9 C)] 98.5 F (36.9 C) (08/12 1927) Pulse Rate:  [96-102] 96 (08/12 1927) BP: (119-121)/(65-75) 119/65 (08/12 1927) Weight:  [68 kg] 68 kg (08/12 1328)  Fetal Wellbeing:  Category II  UC:   regular, every (2) to three (3) minutes  SVE:   Dilation: 10 Effacement (%): 100 Station: Plus 1 Exam by:: Maximino GreenlandM Trogdon RN  Labs: Lab Results  Component Value Date   WBC 15.9 (H) 07/14/2018   HGB 11.9 (L) 07/14/2018   HCT 34.8 (L) 07/14/2018   MCV 84.0 07/14/2018   PLT 308 07/14/2018    Assessment:   Tina PrimesKathryn Birchett Howardis a 27 y.o.G1P0 at 6639w1d being admitted for labor, Rh negative, GBS unknown, plans natural childbirth   FHR Category II  Plan:  Dr. Logan BoresEvans called for bedside evaluation after two (2) hour of pushing in various positions.   Continue orders as written. Reassess as needed.    Tina Rodgers, CNM [facility], [facility] 07/15/2018, 12:53 AM

## 2018-07-16 LAB — FETAL SCREEN: Fetal Screen: NEGATIVE

## 2018-07-16 LAB — GC/CHLAMYDIA PROBE AMP
Chlamydia trachomatis, NAA: NEGATIVE
NEISSERIA GONORRHOEAE BY PCR: NEGATIVE

## 2018-07-16 MED ORDER — RHO D IMMUNE GLOBULIN 1500 UNIT/2ML IJ SOSY
300.0000 ug | PREFILLED_SYRINGE | Freq: Once | INTRAMUSCULAR | Status: AC
  Administered 2018-07-16: 300 ug via INTRAVENOUS
  Filled 2018-07-16: qty 2

## 2018-07-16 NOTE — Progress Notes (Signed)
Progress Note - Vaginal Delivery  Tina Rodgers is a 27 y.o. G1P0101 now PP day 1 s/p Vaginal, Spontaneous .   Subjective:  The patient reports no complaints, up ad lib, voiding, tolerating PO and + flatus.    Objective:  Vital signs in last 24 hours: Temp:  [97.8 F (36.6 C)-98.5 F (36.9 C)] 98.1 F (36.7 C) (08/13 2344) Pulse Rate:  [73-105] 89 (08/13 2344) Resp:  [16-20] 18 (08/13 2344) BP: (100-112)/(59-68) 104/59 (08/13 2344) SpO2:  [96 %-100 %] 96 % (08/13 2344)  Physical Exam:  General: alert, cooperative and fatigued Lochia: appropriate Uterine Fundus: firm DVT Evaluation: No evidence of DVT seen on physical exam. No cords or calf tenderness. No significant calf/ankle edema.    Data Review Recent Labs    07/14/18 1510 07/15/18 0549  HGB 11.9* 9.5*  HCT 34.8* 28.2*    Assessment/Plan: Active Problems:   Indication for care in labor and delivery, antepartum   Acute blood loss anemia   Plan for discharge tomorrow   -- Continue routine PP care.     Doreene Burkennie Brittley Regner, CNM  07/16/2018 8:25 AM

## 2018-07-16 NOTE — Lactation Note (Signed)
This note was copied from a baby's chart. Lactation Consultation Note  Patient Name: Tina Rodgers Today's Date: 07/16/2018    After speaking with RN Tina Rodgers today about some bottle feeding challenges today, and knowing the challenge of breastfeeding that I witnessed yesterday, she and I both agreed that it may be most prudent for Mom to focus on hand expression/pumping to initiate a good milk supply,  and to help baby work on improving suck/swallow with bottle for now until tongue function can be further assessed by an expert to determine diagnosis and proper intervention. Parents agreed that they would also feel better to not spend too much time trying to breastfeed right now as baby seems to be physically unable to do so in a manner that would transfer milk.    Baby has mild jaundice and using a bili blanket. Tina Rodgers and I discussed how parents could do skin to skin with blanket on baby's back side with blanket on outside of bili blanket for comfort. Parents agreed with that as well.   I had already taught mom hand expression and pumping yesterday, but will reinforce when she calls me in to help with pumping after lunch.    Maternal Data    Feeding Feeding Type: Donor Breast Milk Nipple Type: Slow - flow Length of feed: 15 min  LATCH Score                   Interventions    Lactation Tools Discussed/Used     Consult Status      Tina Rodgers 07/16/2018, 1:23 PM

## 2018-07-17 LAB — RHOGAM INJECTION: Unit division: 0

## 2018-07-17 LAB — STREP GP B NAA: Strep Gp B NAA: NEGATIVE

## 2018-07-17 MED ORDER — ACETAMINOPHEN 325 MG PO TABS: 650.0000 mg | ORAL_TABLET | ORAL | 0 refills | Status: DC | PRN

## 2018-07-17 MED ORDER — IBUPROFEN 600 MG PO TABS: 600.0000 mg | ORAL_TABLET | Freq: Four times a day (QID) | ORAL | 0 refills | Status: DC

## 2018-07-17 MED ORDER — DOCUSATE SODIUM 100 MG PO CAPS: 100.0000 mg | ORAL_CAPSULE | Freq: Every day | ORAL | 2 refills | Status: DC | PRN

## 2018-07-17 NOTE — Final Progress Note (Signed)
Discharge Day SOAP Note:  Progress Note - Vaginal Delivery  Tina ChyleKathryn Birchett Rodgers is a 27 y.o. G1P0101 now PP day 2 s/p Vaginal, Spontaneous . Delivery was uncomplicated  Subjective  The patient has the following complaints: has no unusual complaints  Pain is controlled with current medications.   Patient is urinating without difficulty.  She is ambulating well.     Objective  Vital signs: BP (!) 105/47 (BP Location: Right Arm) Comment: Nurse Skyler Stone notified  Pulse 79   Temp 97.9 F (36.6 C) (Oral)   Resp 18   Ht 5\' 1"  (1.549 m)   Wt 68 kg   LMP 11/08/2017 (Approximate)   SpO2 100%   Breastfeeding? Unknown   BMI 28.33 kg/m   Physical Exam: Gen: NAD Lungs clear bilaterally Heart: RRR Fundus Fundal Tone: Firm@ u-1  Lochia Amount: Small  Perineum Appearance: Intact     Data Review Labs: CBC Latest Ref Rng & Units 07/15/2018 07/14/2018 05/22/2018  WBC 3.6 - 11.0 K/uL 24.5(H) 15.9(H) 18.2(H)  Hemoglobin 12.0 - 16.0 g/dL 2.9(F9.5(L) 11.9(L) 12.3  Hematocrit 35.0 - 47.0 % 28.2(L) 34.8(L) 37.8  Platelets 150 - 440 K/uL 275 308 279   A NEG  Assessment/Plan  Active Problems:   Indication for care in labor and delivery, antepartum   Acute blood loss anemia    Plan for discharge today.   Discharge Instructions: Per After Visit Summary. Activity: Advance as tolerated. Pelvic rest for 6 weeks.  Also refer to After Visit Summary Diet: Regular Medications: Allergies as of 07/17/2018      Reactions   Sulfa Antibiotics Other (See Comments)   flush      Medication List    TAKE these medications   acetaminophen 325 MG tablet Commonly known as:  TYLENOL Take 2 tablets (650 mg total) by mouth every 4 (four) hours as needed (for pain scale < 4).   docusate sodium 100 MG capsule Commonly known as:  COLACE Take 1 capsule (100 mg total) by mouth daily as needed.   ibuprofen 600 MG tablet Commonly known as:  ADVIL,MOTRIN Take 1 tablet (600 mg  total) by mouth every 6 (six) hours.   sertraline 25 MG tablet Commonly known as:  ZOLOFT Take 1 tablet (25 mg total) by mouth daily.   VITAFOL ULTRA 29-0.6-0.4-200 MG Caps Take 29 mg by mouth daily.      Outpatient follow up: [facility] with Serafina RoyalsMichelle Lawhorn, CNM in 6 wks. Postpartum contraception: plans condoms   Discharged Condition: good  Discharged to: home  Newborn Data: Disposition:NICU  Apgars: APGAR (1 MIN): 7   APGAR (5 MINS): 9   APGAR (10 MINS):    Baby Feeding: Breast and pumping  Doreene Burkennie Makeda Peeks, CNM  07/17/2018

## 2018-07-17 NOTE — Lactation Note (Signed)
This note was copied from a baby's chart. Lactation Consultation Note  Patient Name: Girl Valinda HoarKathryn Direnzo UJWJX'BToday's Date: 07/17/2018     Maternal Data    Feeding Feeding Type: Donor Breast Milk Nipple Type: Slow - flow Length of feed: 15 min  LATCH Score                   Interventions    Lactation Tools Discussed/Used     Consult Status  LC had conversation with mother about her plans on pumping/breastfeeding. Mother states that she is feeling stressed and overwhelmed and is emotional and tearful. LC asked mother what she wants to do at this point and mother states that she is still deciding put is leaning more towards making a decision not to pump or breastfeed. She also states that she feels guilty for choosing not to breastfeed. LC encouraged mother to make the decision that is going to best work for her that will not leave her stressed, overwhelmed and able to function doing daily activities, whether choosing to pump or just  formula feed. Mother agreed and verbalized understanding.    Arlyss Gandylicia Erilyn Pearman 07/17/2018, 4:22 PM

## 2018-07-17 NOTE — Progress Notes (Signed)
Patient discharged home. Discharge instructions and prescriptions given and reviewed with patient. Patient verbalized understanding. Will be escorted out by auxillary.  

## 2018-07-17 NOTE — Progress Notes (Signed)
Patient very tearful and nervous this morning about being discharged today while baby remains in SCN. Encouraged patient to shower/open blinds in room to help her feel "normal". Patient reports not pumping consistently but wants too- encouraged her to pump after each time baby eats every 3 hours. Pump set up and supplies at bedside. Will continue to support and encourage her throughout the day.

## 2018-07-17 NOTE — Discharge Summary (Signed)
                            Discharge Summary  Date of Admission: 07/14/2018  Date of Discharge: 07/17/2018  Admitting Diagnosis: Onset of Labor at 3956w1d  Mode of Delivery: normal spontaneous vaginal delivery                 Discharge Diagnosis: No other diagnosis   Intrapartum Procedures: Second degree perineal laceration repaired with 3-0 Vicryl Rapide under local anesthesia   Post partum procedures: rhogam  Complications: none                      Discharge Day SOAP Note:  Progress Note - Vaginal Delivery  Tina Rodgers is a 27 y.o. G1P0101 now PP day 2 s/p Vaginal, Spontaneous . Delivery was uncomplicated  Subjective  The patient has the following complaints: has no unusual complaints  Pain is controlled with current medications.   Patient is urinating without difficulty.  She is ambulating well.     Objective  Vital signs: BP (!) 105/47 (BP Location: Right Arm) Comment: Nurse Skyler Stone notified  Pulse 79   Temp 97.9 F (36.6 C) (Oral)   Resp 18   Ht 5\' 1"  (1.549 m)   Wt 68 kg   LMP 11/08/2017 (Approximate)   SpO2 100%   Breastfeeding? Unknown   BMI 28.33 kg/m   Physical Exam: Gen: NAD Lungs clear bilaterally Heart: RRR Fundus Fundal Tone: Firm@ u-1  Lochia Amount: Small  Perineum Appearance: Intact     Data Review Labs: CBC Latest Ref Rng & Units 07/15/2018 07/14/2018 05/22/2018  WBC 3.6 - 11.0 K/uL 24.5(H) 15.9(H) 18.2(H)  Hemoglobin 12.0 - 16.0 g/dL 4.7(W9.5(L) 11.9(L) 12.3  Hematocrit 35.0 - 47.0 % 28.2(L) 34.8(L) 37.8  Platelets 150 - 440 K/uL 275 308 279   A NEG  Assessment/Plan  Active Problems:   Indication for care in labor and delivery, antepartum   Acute blood loss anemia    Plan for discharge today.   Discharge Instructions: Per After Visit Summary. Activity: Advance as tolerated. Pelvic rest for 6 weeks.  Also refer to After Visit Summary Diet: Regular Medications: Allergies as of 07/17/2018      Reactions   Sulfa  Antibiotics Other (See Comments)   flush      Medication List    TAKE these medications   acetaminophen 325 MG tablet Commonly known as:  TYLENOL Take 2 tablets (650 mg total) by mouth every 4 (four) hours as needed (for pain scale < 4).   docusate sodium 100 MG capsule Commonly known as:  COLACE Take 1 capsule (100 mg total) by mouth daily as needed.   ibuprofen 600 MG tablet Commonly known as:  ADVIL,MOTRIN Take 1 tablet (600 mg total) by mouth every 6 (six) hours.   sertraline 25 MG tablet Commonly known as:  ZOLOFT Take 1 tablet (25 mg total) by mouth daily.   VITAFOL ULTRA 29-0.6-0.4-200 MG Caps Take 29 mg by mouth daily.      Outpatient follow up: [facility] with Tina Rodgers, CNM in 6 wks. Postpartum contraception: plans condoms   Discharged Condition: good  Discharged to: home  Newborn Data: Disposition:NICU  Apgars: APGAR (1 MIN): 7   APGAR (5 MINS): 9   APGAR (10 MINS):    Baby Feeding: Breast and pumping  Tina Rodgers, CNM  07/17/2018 11:42 AM

## 2018-07-27 ENCOUNTER — Other Ambulatory Visit: Payer: Self-pay | Admitting: Certified Nurse Midwife

## 2018-07-28 ENCOUNTER — Ambulatory Visit: Payer: Self-pay

## 2018-07-28 NOTE — Lactation Note (Signed)
This note was copied from a baby's chart. Lactation Consultation Note  Patient Name: Tina Rodgers ZOXWR'UToday's Date: 07/28/2018  Baby to be discharged home today.  Mom roomed in last night.  Observed pumping.  Mom is getting 80 ml and baby is taking 60 ml presently with slow flow nipple  Baby has high palate and tight frenulum.  Information on correcting frenulum issues from Dr. Marcos Ekethane hisaw's office given and reviewed.  Mom does not want to put baby to the breast anymore, but does want to continue to pump.  Mom was originally using manual pump.  Rented Symphony pump for one week which was due back today.  Mom has turned in paperwork to get DEBP through insurance but still waiting on doctor's order from [facility].  Mom renewed rental of Symphony pump for another week.  Lactation community resources and numbers given if follow up needed.    Maternal Data    Feeding Feeding Type: Bottle Fed - Breast Milk Nipple Type: Slow - flow  LATCH Score                   Interventions    Lactation Tools Discussed/Used     Consult Status      [name], [name] 07/28/2018, 11:12 AM

## 2018-07-29 ENCOUNTER — Ambulatory Visit (INDEPENDENT_AMBULATORY_CARE_PROVIDER_SITE_OTHER): Payer: BC Managed Care – PPO | Admitting: Certified Nurse Midwife

## 2018-07-29 VITALS — BP 103/65 | HR 102 | Ht 61.0 in | Wt 129.6 lb

## 2018-07-29 DIAGNOSIS — F329 Major depressive disorder, single episode, unspecified: Secondary | ICD-10-CM | POA: Diagnosis not present

## 2018-07-29 DIAGNOSIS — F32A Depression, unspecified: Secondary | ICD-10-CM

## 2018-07-29 MED ORDER — SERTRALINE HCL 50 MG PO TABS: 50.0000 mg | ORAL_TABLET | Freq: Every day | ORAL | 1 refills | Status: DC

## 2018-07-29 NOTE — Progress Notes (Signed)
Pt is dealing with some depression and would like to increase the dose of her medicine.Screening 10.

## 2018-07-29 NOTE — Progress Notes (Signed)
GYN ENCOUNTER NOTE  Subjective:       Tina Rodgers is a 27 y.o. [id number] female here for evaluation of worsening depression symptoms.   Status post spontaneous vaginal birth on 07/15/2018 of late preterm infant (36+1 wks) who was hospitalized for two (2) weeks after birth. Infant released home yesterday. Patient is exclusively pumping, renting pump from hospital.   Noticed increased symptoms of depression a few days before birth. Symptoms have continued to increase and are no longer managed by medication.   No SI/HI. Denies difficulty breathing or respiratory distress, chest pain, abdominal pain, excessive vaginal bleeding, dysuria, and leg pain or swelling.    Gynecologic History  No LMP recorded.  Contraception: abstinence  Last Pap: 2017. Results were: normal  Obstetric History OB History  Gravida Para Term Preterm AB Living  1 1   1   1   SAB TAB Ectopic Multiple Live Births        0 1    # Outcome Date GA Lbr Len/2nd Weight Sex Delivery Anes PTL Lv  1 Preterm 07/15/18 [id number] 05:05 / 03:10 6 lb 5.6 oz (2.88 kg) F Vag-Spont Local  LIV    Past Medical History:  Diagnosis Date  . ADD (attention deficit disorder)   . Vitamin D deficiency     Past Surgical History:  Procedure Laterality Date  . BREAST SURGERY     2 lumps removed- neg    Current Outpatient Medications on File Prior to Visit  Medication Sig Dispense Refill  . acetaminophen (TYLENOL) 325 MG tablet Take 2 tablets (650 mg total) by mouth every 4 (four) hours as needed (for pain scale < 4). 30 tablet 0  . Prenat-Fe Poly-Methfol-FA-DHA (VITAFOL ULTRA) 29-0.6-0.4-200 MG CAPS Take 29 mg by mouth daily. 30 capsule 11   No current facility-administered medications on file prior to visit.     Allergies  Allergen Reactions  . Sulfa Antibiotics Other (See Comments)    flush    Social History   Socioeconomic History  . Marital status: Single    Spouse name: Not on file  . Number of children: Not  on file  . Years of education: Not on file  . Highest education level: Not on file  Occupational History  . Not on file  Social Needs  . Financial resource strain: Not on file  . Food insecurity:    Worry: Not on file    Inability: Not on file  . Transportation needs:    Medical: Not on file    Non-medical: Not on file  Tobacco Use  . Smoking status: Never Smoker  . Smokeless tobacco: Never Used  Substance and Sexual Activity  . Alcohol use: Yes    Frequency: Never    Comment: 3-4 x week  . Drug use: No  . Sexual activity: Yes    Birth control/protection: None  Lifestyle  . Physical activity:    Days per week: Not on file    Minutes per session: Not on file  . Stress: Not on file  Relationships  . Social connections:    Talks on phone: Not on file    Gets together: Not on file    Attends religious service: Not on file    Active member of club or organization: Not on file    Attends meetings of clubs or organizations: Not on file    Relationship status: Not on file  . Intimate partner violence:    Fear of current or ex partner:  Not on file    Emotionally abused: Not on file    Physically abused: Not on file    Forced sexual activity: Not on file  Other Topics Concern  . Not on file  Social History Narrative  . Not on file    Family History  Problem Relation Age of Onset  . Ovarian cancer Paternal Grandmother   . Breast cancer Neg Hx   . Colon cancer Neg Hx   . Diabetes Neg Hx     The following portions of the patient's history were reviewed and updated as appropriate: allergies, current medications, past family history, past medical history, past social history, past surgical history and problem list.  Review of Systems  ROS negative except as noted above. Information obtained from patient.   Objective:   BP 103/65   Pulse (!) 102   Ht 5\' 1"  (1.549 m)   Wt 129 lb 9 oz (58.8 kg)   Breastfeeding? Yes   BMI 24.48 kg/m   GENERAL: Alert and oriented x  4, no apparent distress. Tearful.   Depression screen [facility] 2/9 07/29/2018 06/30/2018 05/22/2018  Decreased Interest 2 2 1   Down, Depressed, Hopeless 2 3 2   PHQ - 2 Score 4 5 3   Altered sleeping 0 1 0  Tired, decreased energy 1 1 0  Change in appetite 1 1 0  Feeling bad or failure about yourself  2 2 2   Trouble concentrating 2 3 2   Moving slowly or fidgety/restless 0 0 0  Suicidal thoughts 0 0 0  PHQ-9 Score 10 13 7   Difficult doing work/chores - Not difficult at all -    Assessment:   1. Depression, unspecified depression type  Plan:   Rx: Zoloft, see orders.   Breast pump Rx completed and faxed.   Patient declined counseling at this time.   Education regarding mindfulness and support options.   Reviewed red flag symptoms and when to call.   RTC x 4 weeks for PPV and med check or sooner if needed.    Gunnar Bulla, CNM [facility], [facility]

## 2018-07-29 NOTE — Patient Instructions (Signed)
Sertraline tablets What is this medicine? SERTRALINE (SER tra leen) is used to treat depression. It may also be used to treat obsessive compulsive disorder, panic disorder, post-trauma stress, premenstrual dysphoric disorder (PMDD) or social anxiety. This medicine may be used for other purposes; ask your health care provider or pharmacist if you have questions. COMMON BRAND NAME(S): Zoloft What should I tell my health care provider before I take this medicine? They need to know if you have any of these conditions: -bleeding disorders -bipolar disorder or a family history of bipolar disorder -glaucoma -heart disease -high blood pressure -history of irregular heartbeat -history of low levels of calcium, magnesium, or potassium in the blood -if you often drink alcohol -liver disease -receiving electroconvulsive therapy -seizures -suicidal thoughts, plans, or attempt; a previous suicide attempt by you or a family member -take medicines that treat or prevent blood clots -thyroid disease -an unusual or allergic reaction to sertraline, other medicines, foods, dyes, or preservatives -pregnant or trying to get pregnant -breast-feeding How should I use this medicine? Take this medicine by mouth with a glass of water. Follow the directions on the prescription label. You can take it with or without food. Take your medicine at regular intervals. Do not take your medicine more often than directed. Do not stop taking this medicine suddenly except upon the advice of your doctor. Stopping this medicine too quickly may cause serious side effects or your condition may worsen. A special MedGuide will be given to you by the pharmacist with each prescription and refill. Be sure to read this information carefully each time. Talk to your pediatrician regarding the use of this medicine in children. While this drug may be prescribed for children as young as 7 years for selected conditions, precautions do  apply. Overdosage: If you think you have taken too much of this medicine contact a poison control center or emergency room at once. NOTE: This medicine is only for you. Do not share this medicine with others. What if I miss a dose? If you miss a dose, take it as soon as you can. If it is almost time for your next dose, take only that dose. Do not take double or extra doses. What may interact with this medicine? Do not take this medicine with any of the following medications: -cisapride -dofetilide -dronedarone -linezolid -MAOIs like Carbex, Eldepryl, Marplan, Nardil, and Parnate -methylene blue (injected into a vein) -pimozide -thioridazine This medicine may also interact with the following medications: -alcohol -amphetamines -aspirin and aspirin-like medicines -certain medicines for depression, anxiety, or psychotic disturbances -certain medicines for fungal infections like ketoconazole, fluconazole, posaconazole, and itraconazole -certain medicines for irregular heart beat like flecainide, quinidine, propafenone -certain medicines for migraine headaches like almotriptan, eletriptan, frovatriptan, naratriptan, rizatriptan, sumatriptan, zolmitriptan -certain medicines for sleep -certain medicines for seizures like carbamazepine, valproic acid, phenytoin -certain medicines that treat or prevent blood clots like warfarin, enoxaparin, dalteparin -cimetidine -digoxin -diuretics -fentanyl -isoniazid -lithium -NSAIDs, medicines for pain and inflammation, like ibuprofen or naproxen -other medicines that prolong the QT interval (cause an abnormal heart rhythm) -rasagiline -safinamide -supplements like St. John's wort, kava kava, valerian -tolbutamide -tramadol -tryptophan This list may not describe all possible interactions. Give your health care provider a list of all the medicines, herbs, non-prescription drugs, or dietary supplements you use. Also tell them if you smoke, drink  alcohol, or use illegal drugs. Some items may interact with your medicine. What should I watch for while using this medicine? Tell your doctor if your symptoms   do not get better or if they get worse. Visit your doctor or health care professional for regular checks on your progress. Because it may take several weeks to see the full effects of this medicine, it is important to continue your treatment as prescribed by your doctor. Patients and their families should watch out for new or worsening thoughts of suicide or depression. Also watch out for sudden changes in feelings such as feeling anxious, agitated, panicky, irritable, hostile, aggressive, impulsive, severely restless, overly excited and hyperactive, or not being able to sleep. If this happens, especially at the beginning of treatment or after a change in dose, call your health care professional. Bonita Quin may get drowsy or dizzy. Do not drive, use machinery, or do anything that needs mental alertness until you know how this medicine affects you. Do not stand or sit up quickly, especially if you are an older patient. This reduces the risk of dizzy or fainting spells. Alcohol may interfere with the effect of this medicine. Avoid alcoholic drinks. Your mouth may get dry. Chewing sugarless gum or sucking hard candy, and drinking plenty of water may help. Contact your doctor if the problem does not go away or is severe. What side effects may I notice from receiving this medicine? Side effects that you should report to your doctor or health care professional as soon as possible: -allergic reactions like skin rash, itching or hives, swelling of the face, lips, or tongue -anxious -black, tarry stools -changes in vision -confusion -elevated mood, decreased need for sleep, racing thoughts, impulsive behavior -eye pain -fast, irregular heartbeat -feeling faint or lightheaded, falls -feeling agitated, angry, or irritable -hallucination, loss of contact with  reality -loss of balance or coordination -loss of memory -painful or prolonged erections -restlessness, pacing, inability to keep still -seizures -stiff muscles -suicidal thoughts or other mood changes -trouble sleeping -unusual bleeding or bruising -unusually weak or tired -vomiting Side effects that usually do not require medical attention (report to your doctor or health care professional if they continue or are bothersome): -change in appetite or weight -change in sex drive or performance -diarrhea -increased sweating -indigestion, nausea -tremors This list may not describe all possible side effects. Call your doctor for medical advice about side effects. You may report side effects to FDA at 1-800-FDA-1088. Where should I keep my medicine? Keep out of the reach of children. Store at room temperature between 15 and 30 degrees C (59 and 86 degrees F). Throw away any unused medicine after the expiration date. NOTE: This sheet is a summary. It may not cover all possible information. If you have questions about this medicine, talk to your doctor, pharmacist, or health care provider.  2018 Elsevier/Gold Standard (2016-11-23 14:17:49) Postpartum Depression and Baby Blues The postpartum period begins right after the birth of a baby. During this time, there is often a great amount of joy and excitement. It is also a time of many changes in the life of the parents. Regardless of how many times a mother gives birth, each child brings new challenges and dynamics to the family. It is not unusual to have feelings of excitement along with confusing shifts in moods, emotions, and thoughts. All mothers are at risk of developing postpartum depression or the "baby blues." These mood changes can occur right after giving birth, or they may occur many months after giving birth. The baby blues or postpartum depression can be mild or severe. Additionally, postpartum depression can go away rather quickly, or  it can  be a long-term condition. What are the causes? Raised hormone levels and the rapid drop in those levels are thought to be a main cause of postpartum depression and the baby blues. A number of hormones change during and after pregnancy. Estrogen and progesterone usually decrease right after the delivery of your baby. The levels of thyroid hormone and various cortisol steroids also rapidly drop. Other factors that play a role in these mood changes include major life events and genetics. What increases the risk? If you have any of the following risks for the baby blues or postpartum depression, know what symptoms to watch out for during the postpartum period. Risk factors that may increase the likelihood of getting the baby blues or postpartum depression include:  Having a personal or family history of depression.  Having depression while being pregnant.  Having premenstrual mood issues or mood issues related to oral contraceptives.  Having a lot of life stress.  Having marital conflict.  Lacking a social support network.  Having a baby with special needs.  Having health problems, such as diabetes.  What are the signs or symptoms? Symptoms of baby blues include:  Brief changes in mood, such as going from extreme happiness to sadness.  Decreased concentration.  Difficulty sleeping.  Crying spells, tearfulness.  Irritability.  Anxiety.  Symptoms of postpartum depression typically begin within the first month after giving birth. These symptoms include:  Difficulty sleeping or excessive sleepiness.  Marked weight loss.  Agitation.  Feelings of worthlessness.  Lack of interest in activity or food.  Postpartum psychosis is a very serious condition and can be dangerous. Fortunately, it is rare. Displaying any of the following symptoms is cause for immediate medical attention. Symptoms of postpartum psychosis include:  Hallucinations and delusions.  Bizarre or  disorganized behavior.  Confusion or disorientation.  How is this diagnosed? A diagnosis is made by an evaluation of your symptoms. There are no medical or lab tests that lead to a diagnosis, but there are various questionnaires that a health care provider may use to identify those with the baby blues, postpartum depression, or psychosis. Often, a screening tool called the [place] Postnatal Depression Scale is used to diagnose depression in the postpartum period. How is this treated? The baby blues usually goes away on its own in 1-2 weeks. Social support is often all that is needed. You will be encouraged to get adequate sleep and rest. Occasionally, you may be given medicines to help you sleep. Postpartum depression requires treatment because it can last several months or longer if it is not treated. Treatment may include individual or group therapy, medicine, or both to address any social, physiological, and psychological factors that may play a role in the depression. Regular exercise, a healthy diet, rest, and social support may also be strongly recommended. Postpartum psychosis is more serious and needs treatment right away. Hospitalization is often needed. Follow these instructions at home:  Get as much rest as you can. Nap when the baby sleeps.  Exercise regularly. Some women find yoga and walking to be beneficial.  Eat a balanced and nourishing diet.  Do little things that you enjoy. Have a cup of tea, take a bubble bath, read your favorite magazine, or listen to your favorite music.  Avoid alcohol.  Ask for help with household chores, cooking, grocery shopping, or running errands as needed. Do not try to do everything.  Talk to people close to you about how you are feeling. Get support from your partner,  family members, friends, or other new moms.  Try to stay positive in how you think. Think about the things you are grateful for.  Do not spend a lot of time alone.  Only  take over-the-counter or prescription medicine as directed by your health care provider.  Keep all your postpartum appointments.  Let your health care provider know if you have any concerns. Contact a health care provider if: You are having a reaction to or problems with your medicine. Get help right away if:  You have suicidal feelings.  You think you may harm the baby or someone else. This information is not intended to replace advice given to you by your health care provider. Make sure you discuss any questions you have with your health care provider. Document Released: 08/23/2004 Document Revised: 04/26/2016 Document Reviewed: 08/31/2013 Elsevier Interactive Patient Education  2017 [employer].

## 2018-08-11 ENCOUNTER — Inpatient Hospital Stay: Admit: 2018-08-11 | Payer: Self-pay

## 2018-08-17 ENCOUNTER — Encounter: Payer: Self-pay | Admitting: Certified Nurse Midwife

## 2018-08-18 ENCOUNTER — Other Ambulatory Visit: Payer: Self-pay | Admitting: Certified Nurse Midwife

## 2018-08-18 DIAGNOSIS — F418 Other specified anxiety disorders: Secondary | ICD-10-CM

## 2018-08-26 ENCOUNTER — Encounter: Payer: BC Managed Care – PPO | Admitting: Certified Nurse Midwife

## 2018-08-26 ENCOUNTER — Ambulatory Visit (INDEPENDENT_AMBULATORY_CARE_PROVIDER_SITE_OTHER): Payer: BC Managed Care – PPO | Admitting: Certified Nurse Midwife

## 2018-08-26 VITALS — BP 91/58 | HR 73 | Ht 61.0 in | Wt 132.0 lb

## 2018-08-26 DIAGNOSIS — Z23 Encounter for immunization: Secondary | ICD-10-CM

## 2018-08-26 DIAGNOSIS — Z1389 Encounter for screening for other disorder: Secondary | ICD-10-CM | POA: Diagnosis not present

## 2018-08-26 DIAGNOSIS — F32A Depression, unspecified: Secondary | ICD-10-CM

## 2018-08-26 DIAGNOSIS — R300 Dysuria: Secondary | ICD-10-CM

## 2018-08-26 DIAGNOSIS — F329 Major depressive disorder, single episode, unspecified: Secondary | ICD-10-CM

## 2018-08-26 LAB — POCT URINALYSIS DIPSTICK
Bilirubin, UA: NEGATIVE
Glucose, UA: NEGATIVE
KETONES UA: NEGATIVE
LEUKOCYTES UA: NEGATIVE
NITRITE UA: NEGATIVE
PH UA: 8 (ref 5.0–8.0)
PROTEIN UA: NEGATIVE
Spec Grav, UA: <=1.005 — AB (ref 1.010–1.025)
Urobilinogen, UA: 0.2 E.U./dL

## 2018-08-26 MED ORDER — SERTRALINE HCL 50 MG PO TABS: 50.0000 mg | ORAL_TABLET | Freq: Every day | ORAL | 4 refills | Status: DC

## 2018-08-26 NOTE — Patient Instructions (Addendum)
Sertraline tablets What is this medicine? SERTRALINE (SER tra leen) is used to treat depression. It may also be used to treat obsessive compulsive disorder, panic disorder, post-trauma stress, premenstrual dysphoric disorder (PMDD) or social anxiety. This medicine may be used for other purposes; ask your health care provider or pharmacist if you have questions. COMMON BRAND NAME(S): Zoloft What should I tell my health care provider before I take this medicine? They need to know if you have any of these conditions: -bleeding disorders -bipolar disorder or a family history of bipolar disorder -glaucoma -heart disease -high blood pressure -history of irregular heartbeat -history of low levels of calcium, magnesium, or potassium in the blood -if you often drink alcohol -liver disease -receiving electroconvulsive therapy -seizures -suicidal thoughts, plans, or attempt; a previous suicide attempt by you or a family member -take medicines that treat or prevent blood clots -thyroid disease -an unusual or allergic reaction to sertraline, other medicines, foods, dyes, or preservatives -pregnant or trying to get pregnant -breast-feeding How should I use this medicine? Take this medicine by mouth with a glass of water. Follow the directions on the prescription label. You can take it with or without food. Take your medicine at regular intervals. Do not take your medicine more often than directed. Do not stop taking this medicine suddenly except upon the advice of your doctor. Stopping this medicine too quickly may cause serious side effects or your condition may worsen. A special MedGuide will be given to you by the pharmacist with each prescription and refill. Be sure to read this information carefully each time. Talk to your pediatrician regarding the use of this medicine in children. While this drug may be prescribed for children as young as 7 years for selected conditions, precautions do  apply. Overdosage: If you think you have taken too much of this medicine contact a poison control center or emergency room at once. NOTE: This medicine is only for you. Do not share this medicine with others. What if I miss a dose? If you miss a dose, take it as soon as you can. If it is almost time for your next dose, take only that dose. Do not take double or extra doses. What may interact with this medicine? Do not take this medicine with any of the following medications: -cisapride -dofetilide -dronedarone -linezolid -MAOIs like Carbex, Eldepryl, Marplan, Nardil, and Parnate -methylene blue (injected into a vein) -pimozide -thioridazine This medicine may also interact with the following medications: -alcohol -amphetamines -aspirin and aspirin-like medicines -certain medicines for depression, anxiety, or psychotic disturbances -certain medicines for fungal infections like ketoconazole, fluconazole, posaconazole, and itraconazole -certain medicines for irregular heart beat like flecainide, quinidine, propafenone -certain medicines for migraine headaches like almotriptan, eletriptan, frovatriptan, naratriptan, rizatriptan, sumatriptan, zolmitriptan -certain medicines for sleep -certain medicines for seizures like carbamazepine, valproic acid, phenytoin -certain medicines that treat or prevent blood clots like warfarin, enoxaparin, dalteparin -cimetidine -digoxin -diuretics -fentanyl -isoniazid -lithium -NSAIDs, medicines for pain and inflammation, like ibuprofen or naproxen -other medicines that prolong the QT interval (cause an abnormal heart rhythm) -rasagiline -safinamide -supplements like St. John's wort, kava kava, valerian -tolbutamide -tramadol -tryptophan This list may not describe all possible interactions. Give your health care provider a list of all the medicines, herbs, non-prescription drugs, or dietary supplements you use. Also tell them if you smoke, drink  alcohol, or use illegal drugs. Some items may interact with your medicine. What should I watch for while using this medicine? Tell your doctor if your symptoms   do not get better or if they get worse. Visit your doctor or health care professional for regular checks on your progress. Because it may take several weeks to see the full effects of this medicine, it is important to continue your treatment as prescribed by your doctor. Patients and their families should watch out for new or worsening thoughts of suicide or depression. Also watch out for sudden changes in feelings such as feeling anxious, agitated, panicky, irritable, hostile, aggressive, impulsive, severely restless, overly excited and hyperactive, or not being able to sleep. If this happens, especially at the beginning of treatment or after a change in dose, call your health care professional. You may get drowsy or dizzy. Do not drive, use machinery, or do anything that needs mental alertness until you know how this medicine affects you. Do not stand or sit up quickly, especially if you are an older patient. This reduces the risk of dizzy or fainting spells. Alcohol may interfere with the effect of this medicine. Avoid alcoholic drinks. Your mouth may get dry. Chewing sugarless gum or sucking hard candy, and drinking plenty of water may help. Contact your doctor if the problem does not go away or is severe. What side effects may I notice from receiving this medicine? Side effects that you should report to your doctor or health care professional as soon as possible: -allergic reactions like skin rash, itching or hives, swelling of the face, lips, or tongue -anxious -black, tarry stools -changes in vision -confusion -elevated mood, decreased need for sleep, racing thoughts, impulsive behavior -eye pain -fast, irregular heartbeat -feeling faint or lightheaded, falls -feeling agitated, angry, or irritable -hallucination, loss of contact with  reality -loss of balance or coordination -loss of memory -painful or prolonged erections -restlessness, pacing, inability to keep still -seizures -stiff muscles -suicidal thoughts or other mood changes -trouble sleeping -unusual bleeding or bruising -unusually weak or tired -vomiting Side effects that usually do not require medical attention (report to your doctor or health care professional if they continue or are bothersome): -change in appetite or weight -change in sex drive or performance -diarrhea -increased sweating -indigestion, nausea -tremors This list may not describe all possible side effects. Call your doctor for medical advice about side effects. You may report side effects to FDA at 1-800-FDA-1088. Where should I keep my medicine? Keep out of the reach of children. Store at room temperature between 15 and 30 degrees C (59 and 86 degrees F). Throw away any unused medicine after the expiration date. NOTE: This sheet is a summary. It may not cover all possible information. If you have questions about this medicine, talk to your doctor, pharmacist, or health care provider.  2018 Elsevier/Gold Standard (2016-11-23 14:17:49) Preventive Care 18-39 Years, Female Preventive care refers to lifestyle choices and visits with your health care provider that can promote health and wellness. What does preventive care include?  A yearly physical exam. This is also called an annual well check.  Dental exams once or twice a year.  Routine eye exams. Ask your health care provider how often you should have your eyes checked.  Personal lifestyle choices, including: ? Daily care of your teeth and gums. ? Regular physical activity. ? Eating a healthy diet. ? Avoiding tobacco and drug use. ? Limiting alcohol use. ? Practicing safe sex. ? Taking vitamin and mineral supplements as recommended by your health care provider. What happens during an annual well check? The services and  screenings done by your health care provider   during your annual well check will depend on your age, overall health, lifestyle risk factors, and family history of disease. Counseling Your health care provider may ask you questions about your:  Alcohol use.  Tobacco use.  Drug use.  Emotional well-being.  Home and relationship well-being.  Sexual activity.  Eating habits.  Work and work Statistician.  Method of birth control.  Menstrual cycle.  Pregnancy history.  Screening You may have the following tests or measurements:  Height, weight, and BMI.  Diabetes screening. This is done by checking your blood sugar (glucose) after you have not eaten for a while (fasting).  Blood pressure.  Lipid and cholesterol levels. These may be checked every 5 years starting at age 24.  Skin check.  Hepatitis C blood test.  Hepatitis B blood test.  Sexually transmitted disease (STD) testing.  BRCA-related cancer screening. This may be done if you have a family history of breast, ovarian, tubal, or peritoneal cancers.  Pelvic exam and Pap test. This may be done every 3 years starting at age 58. Starting at age 26, this may be done every 5 years if you have a Pap test in combination with an HPV test.  Discuss your test results, treatment options, and if necessary, the need for more tests with your health care provider. Vaccines Your health care provider may recommend certain vaccines, such as:  Influenza vaccine. This is recommended every year.  Tetanus, diphtheria, and acellular pertussis (Tdap, Td) vaccine. You may need a Td booster every 10 years.  Varicella vaccine. You may need this if you have not been vaccinated.  HPV vaccine. If you are 51 or younger, you may need three doses over 6 months.  Measles, mumps, and rubella (MMR) vaccine. You may need at least one dose of MMR. You may also need a second dose.  Pneumococcal 13-valent conjugate (PCV13) vaccine. You may need  this if you have certain conditions and were not previously vaccinated.  Pneumococcal polysaccharide (PPSV23) vaccine. You may need one or two doses if you smoke cigarettes or if you have certain conditions.  Meningococcal vaccine. One dose is recommended if you are age 24-21 years and a first-year college student living in a residence hall, or if you have one of several medical conditions. You may also need additional booster doses.  Hepatitis A vaccine. You may need this if you have certain conditions or if you travel or work in places where you may be exposed to hepatitis A.  Hepatitis B vaccine. You may need this if you have certain conditions or if you travel or work in places where you may be exposed to hepatitis B.  Haemophilus influenzae type b (Hib) vaccine. You may need this if you have certain risk factors.  Talk to your health care provider about which screenings and vaccines you need and how often you need them. This information is not intended to replace advice given to you by your health care provider. Make sure you discuss any questions you have with your health care provider. Document Released: 01/15/2002 Document Revised: 08/08/2016 Document Reviewed: 09/20/2015 Elsevier Interactive Patient Education  [employer].

## 2018-08-26 NOTE — Progress Notes (Signed)
Subjective:    Tina Rodgers is a 27 y.o. [id number] Caucasian female who presents for a postpartum visit. She is 6 weeks postpartum following a spontaneous vaginal delivery at 36+1 gestational weeks. Anesthesia: local. I have fully reviewed the prenatal and intrapartum course.  Postpartum course has been complicated by increased depression symptoms requiring medication. Baby's course has been uncomplicated. Baby is feeding by breast with formula supplementation. Bleeding staining only. Bowel function is normal. Bladder function is normal except noted pain with urine for the last few days.   Patient is not sexually active. Contraception method is condoms. Postpartum depression screening: negative. Score 6.  Last pap 2017 and was negative.  Denies difficulty breathing or respiratory distress, chest pain, abdominal pain, excessive vaginal bleeding, and leg pain or swelling.   The following portions of the patient's history were reviewed and updated as appropriate: allergies, current medications, past medical history, past surgical history and problem list.  Review of Systems  Pertinent items are noted in HPI.   Objective:   BP (!) 91/58   Pulse 73   Ht 5\' 1"  (1.549 m)   Wt 132 lb (59.9 kg)   LMP 08/18/2018 (Approximate)   BMI 24.94 kg/m   General:  alert, cooperative and no distress   Breasts:  deferred, no complaints  Lungs: clear to auscultation bilaterally  Heart:  regular rate and rhythm  Abdomen: soft, nontender   Vulva: normal  Vagina: normal vagina  Cervix:  closed  Corpus: Well-involuted  Adnexa:  Non-palpable   [facility] 08/26/2018 07/29/2018 06/30/2018  Decreased Interest 1 2 2   Down, Depressed, Hopeless 2 2 3   PHQ - 2 Score 3 4 5   Altered sleeping 0 0 1  Tired, decreased energy 0 1 1  Change in appetite 0 1 1  Feeling bad or failure about yourself  2 2 2   Trouble concentrating 1 2 3   Moving slowly or fidgety/restless 0 0 0  Suicidal thoughts  0 0 0  PHQ-9 Score 6 10 13   Difficult doing work/chores - - Not difficult at all   Assessment:   Postpartum exam Six (6) wks s/p spontaneous vaginal birth Breastfeeding with formula supplementation Depression screening Contraception counseling   Plan:   Labs: Urine culture, see orders.   Flu vaccination given, see orders.   May return to work without restrictions.   Continue medication as ordered.   Referral to behavioral health, see orders.   Reviewed red flag symptoms and when to call.   Follow up in: 3 months for ANNUAL EXAM or earlier if needed.   Gunnar BullaJenkins Michelle Sybrina Laning, CNM [facility], [facility]

## 2018-08-27 ENCOUNTER — Telehealth: Payer: Self-pay | Admitting: Certified Nurse Midwife

## 2018-08-27 ENCOUNTER — Other Ambulatory Visit: Payer: Self-pay

## 2018-08-27 MED ORDER — NITROFURANTOIN MONOHYD MACRO 100 MG PO CAPS: 100.0000 mg | ORAL_CAPSULE | Freq: Two times a day (BID) | ORAL | 0 refills | Status: DC

## 2018-08-27 NOTE — Telephone Encounter (Signed)
The pt called and was irritated with not having her results back yet. I informed the patient that a provider or nurse will reach out as soon as they can. Please advise.

## 2018-08-28 LAB — URINE CULTURE

## 2018-11-04 ENCOUNTER — Encounter: Payer: BC Managed Care – PPO | Admitting: Certified Nurse Midwife

## 2018-11-10 ENCOUNTER — Other Ambulatory Visit (HOSPITAL_COMMUNITY)
Admission: RE | Admit: 2018-11-10 | Discharge: 2018-11-10 | Disposition: A | Discharge status: Home / Self Care | Payer: BC Managed Care – PPO | Source: Ambulatory Visit | Attending: Certified Nurse Midwife | Admitting: Certified Nurse Midwife

## 2018-11-10 ENCOUNTER — Ambulatory Visit (INDEPENDENT_AMBULATORY_CARE_PROVIDER_SITE_OTHER): Payer: BC Managed Care – PPO | Admitting: Certified Nurse Midwife

## 2018-11-10 VITALS — BP 115/67 | HR 83 | Ht 61.0 in | Wt 140.3 lb

## 2018-11-10 DIAGNOSIS — F329 Major depressive disorder, single episode, unspecified: Secondary | ICD-10-CM

## 2018-11-10 DIAGNOSIS — Z124 Encounter for screening for malignant neoplasm of cervix: Secondary | ICD-10-CM | POA: Insufficient documentation

## 2018-11-10 DIAGNOSIS — F419 Anxiety disorder, unspecified: Secondary | ICD-10-CM | POA: Insufficient documentation

## 2018-11-10 DIAGNOSIS — Z1331 Encounter for screening for depression: Secondary | ICD-10-CM | POA: Diagnosis not present

## 2018-11-10 DIAGNOSIS — F32A Depression, unspecified: Secondary | ICD-10-CM | POA: Insufficient documentation

## 2018-11-10 DIAGNOSIS — Z01419 Encounter for gynecological examination (general) (routine) without abnormal findings: Secondary | ICD-10-CM

## 2018-11-10 MED ORDER — SERTRALINE HCL 50 MG PO TABS: 50.0000 mg | ORAL_TABLET | Freq: Every day | ORAL | 11 refills | Status: DC

## 2018-11-10 NOTE — Progress Notes (Signed)
ANNUAL PREVENTATIVE CARE GYN  ENCOUNTER NOTE  Subjective:       Tina Rodgers is a 27 y.o. [id number] female here for a routine annual gynecologic exam.  Current complaints: 1. Desires Pap smear 2. Needs Zoloft refill  Doing well. Speaking with counselor weekly. Taking Zoloft daily. Pumping and feeding infant from bottle.   Denies difficulty breathing or respiratory distress, chest pain, abdominal pain, vaginal bleeding, dysuria, and leg pain or swelling.   Gynecologic History  No LMP recorded. (Menstrual status: Lactating).  Contraception: condoms  Last Pap: 2017. Results were: normal  Obstetric History  OB History  Gravida Para Term Preterm AB Living  1 1   1   1   SAB TAB Ectopic Multiple Live Births        0 1    # Outcome Date GA Lbr Len/2nd Weight Sex Delivery Anes PTL Lv  1 Preterm 07/15/18 520w1d 05:05 / 03:10 6 lb 5.6 oz (2.88 kg) F Vag-Spont Local  LIV    Past Medical History:  Diagnosis Date  . ADD (attention deficit disorder)   . Vitamin D deficiency     Past Surgical History:  Procedure Laterality Date  . BREAST SURGERY     2 lumps removed- neg    Current Outpatient Medications on File Prior to Visit  Medication Sig Dispense Refill  . Prenat-Fe Poly-Methfol-FA-DHA (VITAFOL ULTRA) 29-0.6-0.4-200 MG CAPS Take 29 mg by mouth daily. 30 capsule 11   No current facility-administered medications on file prior to visit.     Allergies  Allergen Reactions  . Sulfa Antibiotics Other (See Comments)    flush    Social History   Socioeconomic History  . Marital status: Single    Spouse name: Not on file  . Number of children: Not on file  . Years of education: Not on file  . Highest education level: Not on file  Occupational History  . Not on file  Social Needs  . Financial resource strain: Not on file  . Food insecurity:    Worry: Not on file    Inability: Not on file  . Transportation needs:    Medical: Not on file    Non-medical: Not on  file  Tobacco Use  . Smoking status: Never Smoker  . Smokeless tobacco: Never Used  Substance and Sexual Activity  . Alcohol use: Yes    Frequency: Never    Comment: 3-4 x week  . Drug use: No  . Sexual activity: Yes    Birth control/protection: None  Lifestyle  . Physical activity:    Days per week: Not on file    Minutes per session: Not on file  . Stress: Not on file  Relationships  . Social connections:    Talks on phone: Not on file    Gets together: Not on file    Attends religious service: Not on file    Active member of club or organization: Not on file    Attends meetings of clubs or organizations: Not on file    Relationship status: Not on file  . Intimate partner violence:    Fear of current or ex partner: Not on file    Emotionally abused: Not on file    Physically abused: Not on file    Forced sexual activity: Not on file  Other Topics Concern  . Not on file  Social History Narrative  . Not on file    Family History  Problem Relation Age of Onset  .  Ovarian cancer Paternal Grandmother   . Breast cancer Neg Hx   . Colon cancer Neg Hx   . Diabetes Neg Hx     The following portions of the patient's history were reviewed and updated as appropriate: allergies, current medications, past family history, past medical history, past social history, past surgical history and problem list.  Review of Systems  ROS negative except as noted above. Information obtained from patient.    Objective:   BP 115/67   Pulse 83   Ht 5\' 1"  (1.549 m)   Wt 140 lb 5 oz (63.6 kg)   Breastfeeding? Yes   BMI 26.51 kg/m    CONSTITUTIONAL: Well-developed, well-nourished female in no acute distress.   PSYCHIATRIC: Normal mood and affect. Normal behavior. Normal judgment and thought content.  NEUROLGIC: Alert and oriented to person, place, and time. Normal muscle tone coordination. No cranial nerve deficit noted.  HENT:  Normocephalic, atraumatic, External right and left ear  normal. Oropharynx is clear and moist  EYES: Conjunctivae and EOM are normal. Pupils are equal, round, and reactive to light. No scleral icterus.   NECK: Normal range of motion, supple, no masses.  Normal thyroid.   SKIN: Skin is warm and dry. No rash noted. Not diaphoretic. No  erythema. No pallor.  CARDIOVASCULAR: Normal heart rate noted, regular rhythm, no murmur.  RESPIRATORY: Clear to auscultation bilaterally. Effort and breath sounds normal, no problems with respiration noted.  BREASTS: Symmetric in size. No masses, skin changes, nipple  drainage, or lymphadenopathy. Lactating.   ABDOMEN: Soft, normal bowel sounds, no distention noted.  No tenderness, rebound or guarding.   PELVIC:  External Genitalia: Normal  Vagina: Normal  Cervix: Normal  Uterus: Normal  Adnexa: Normal  MUSCULOSKELETAL: Normal range of motion. No tenderness.  No cyanosis, clubbing, or edema.  2+ distal pulses.  LYMPHATIC: No Axillary, Supraclavicular, or Inguinal Adenopathy.  Depression screen [facility] 2/9 11/10/2018 08/26/2018 07/29/2018 06/30/2018 05/22/2018  Decreased Interest 1 1 2 2 1   Down, Depressed, Hopeless 1 2 2 3 2   PHQ - 2 Score 2 3 4 5 3   Altered sleeping 0 0 0 1 0  Tired, decreased energy 2 0 1 1 0  Change in appetite 2 0 1 1 0  Feeling bad or failure about yourself  1 2 2 2 2   Trouble concentrating 0 1 2 3 2   Moving slowly or fidgety/restless 0 0 0 0 0  Suicidal thoughts 0 0 0 0 0  PHQ-9 Score 7 6 10 13 7   Difficult doing work/chores - - - Not difficult at all -    Assessment:   Annual gynecologic examination 27 y.o.   Contraception: condoms   Overweight   Problem List Items Addressed This Visit      Other   Anxiety and depression   Relevant Medications   sertraline (ZOLOFT) 50 MG tablet    Other Visit Diagnoses    Well woman exam    -  Primary   Relevant Orders   Cytology - PAP   Depression screen       Cervical cancer screening       Relevant Orders   Cytology - PAP      Plan:   Pap: Pap, Reflex if ASCUS  Labs: Declined   Routine preventative health maintenance measures emphasized: Exercise/Diet/Weight control, Tobacco Warnings, Alcohol/Substance use risks and Stress Management; see AVS  Rx: Zoloft, see orders  Reviewed red flag symptoms and when to call  RTC x 1  year for [facility] or sooner if needed   Gunnar Bulla, CNM [facility], [facility] 11/10/18 3:05 PM

## 2018-11-10 NOTE — Patient Instructions (Signed)
Sertraline tablets What is this medicine? SERTRALINE (SER tra leen) is used to treat depression. It may also be used to treat obsessive compulsive disorder, panic disorder, post-trauma stress, premenstrual dysphoric disorder (PMDD) or social anxiety. This medicine may be used for other purposes; ask your health care provider or pharmacist if you have questions. COMMON BRAND NAME(S): Zoloft What should I tell my health care provider before I take this medicine? They need to know if you have any of these conditions: -bleeding disorders -bipolar disorder or a family history of bipolar disorder -glaucoma -heart disease -high blood pressure -history of irregular heartbeat -history of low levels of calcium, magnesium, or potassium in the blood -if you often drink alcohol -liver disease -receiving electroconvulsive therapy -seizures -suicidal thoughts, plans, or attempt; a previous suicide attempt by you or a family member -take medicines that treat or prevent blood clots -thyroid disease -an unusual or allergic reaction to sertraline, other medicines, foods, dyes, or preservatives -pregnant or trying to get pregnant -breast-feeding How should I use this medicine? Take this medicine by mouth with a glass of water. Follow the directions on the prescription label. You can take it with or without food. Take your medicine at regular intervals. Do not take your medicine more often than directed. Do not stop taking this medicine suddenly except upon the advice of your doctor. Stopping this medicine too quickly may cause serious side effects or your condition may worsen. A special MedGuide will be given to you by the pharmacist with each prescription and refill. Be sure to read this information carefully each time. Talk to your pediatrician regarding the use of this medicine in children. While this drug may be prescribed for children as young as 7 years for selected conditions, precautions do  apply. Overdosage: If you think you have taken too much of this medicine contact a poison control center or emergency room at once. NOTE: This medicine is only for you. Do not share this medicine with others. What if I miss a dose? If you miss a dose, take it as soon as you can. If it is almost time for your next dose, take only that dose. Do not take double or extra doses. What may interact with this medicine? Do not take this medicine with any of the following medications: -cisapride -dofetilide -dronedarone -linezolid -MAOIs like Carbex, Eldepryl, Marplan, Nardil, and Parnate -methylene blue (injected into a vein) -pimozide -thioridazine This medicine may also interact with the following medications: -alcohol -amphetamines -aspirin and aspirin-like medicines -certain medicines for depression, anxiety, or psychotic disturbances -certain medicines for fungal infections like ketoconazole, fluconazole, posaconazole, and itraconazole -certain medicines for irregular heart beat like flecainide, quinidine, propafenone -certain medicines for migraine headaches like almotriptan, eletriptan, frovatriptan, naratriptan, rizatriptan, sumatriptan, zolmitriptan -certain medicines for sleep -certain medicines for seizures like carbamazepine, valproic acid, phenytoin -certain medicines that treat or prevent blood clots like warfarin, enoxaparin, dalteparin -cimetidine -digoxin -diuretics -fentanyl -isoniazid -lithium -NSAIDs, medicines for pain and inflammation, like ibuprofen or naproxen -other medicines that prolong the QT interval (cause an abnormal heart rhythm) -rasagiline -safinamide -supplements like St. John's wort, kava kava, valerian -tolbutamide -tramadol -tryptophan This list may not describe all possible interactions. Give your health care provider a list of all the medicines, herbs, non-prescription drugs, or dietary supplements you use. Also tell them if you smoke, drink  alcohol, or use illegal drugs. Some items may interact with your medicine. What should I watch for while using this medicine? Tell your doctor if your symptoms   do not get better or if they get worse. Visit your doctor or health care professional for regular checks on your progress. Because it may take several weeks to see the full effects of this medicine, it is important to continue your treatment as prescribed by your doctor. Patients and their families should watch out for new or worsening thoughts of suicide or depression. Also watch out for sudden changes in feelings such as feeling anxious, agitated, panicky, irritable, hostile, aggressive, impulsive, severely restless, overly excited and hyperactive, or not being able to sleep. If this happens, especially at the beginning of treatment or after a change in dose, call your health care professional. You may get drowsy or dizzy. Do not drive, use machinery, or do anything that needs mental alertness until you know how this medicine affects you. Do not stand or sit up quickly, especially if you are an older patient. This reduces the risk of dizzy or fainting spells. Alcohol may interfere with the effect of this medicine. Avoid alcoholic drinks. Your mouth may get dry. Chewing sugarless gum or sucking hard candy, and drinking plenty of water may help. Contact your doctor if the problem does not go away or is severe. What side effects may I notice from receiving this medicine? Side effects that you should report to your doctor or health care professional as soon as possible: -allergic reactions like skin rash, itching or hives, swelling of the face, lips, or tongue -anxious -black, tarry stools -changes in vision -confusion -elevated mood, decreased need for sleep, racing thoughts, impulsive behavior -eye pain -fast, irregular heartbeat -feeling faint or lightheaded, falls -feeling agitated, angry, or irritable -hallucination, loss of contact with  reality -loss of balance or coordination -loss of memory -painful or prolonged erections -restlessness, pacing, inability to keep still -seizures -stiff muscles -suicidal thoughts or other mood changes -trouble sleeping -unusual bleeding or bruising -unusually weak or tired -vomiting Side effects that usually do not require medical attention (report to your doctor or health care professional if they continue or are bothersome): -change in appetite or weight -change in sex drive or performance -diarrhea -increased sweating -indigestion, nausea -tremors This list may not describe all possible side effects. Call your doctor for medical advice about side effects. You may report side effects to FDA at 1-800-FDA-1088. Where should I keep my medicine? Keep out of the reach of children. Store at room temperature between 15 and 30 degrees C (59 and 86 degrees F). Throw away any unused medicine after the expiration date. NOTE: This sheet is a summary. It may not cover all possible information. If you have questions about this medicine, talk to your doctor, pharmacist, or health care provider.  2018 Elsevier/Gold Standard (2016-11-23 14:17:49) Preventive Care 18-39 Years, Female Preventive care refers to lifestyle choices and visits with your health care provider that can promote health and wellness. What does preventive care include?  A yearly physical exam. This is also called an annual well check.  Dental exams once or twice a year.  Routine eye exams. Ask your health care provider how often you should have your eyes checked.  Personal lifestyle choices, including: ? Daily care of your teeth and gums. ? Regular physical activity. ? Eating a healthy diet. ? Avoiding tobacco and drug use. ? Limiting alcohol use. ? Practicing safe sex. ? Taking vitamin and mineral supplements as recommended by your health care provider. What happens during an annual well check? The services and  screenings done by your health care provider   during your annual well check will depend on your age, overall health, lifestyle risk factors, and family history of disease. Counseling Your health care provider may ask you questions about your:  Alcohol use.  Tobacco use.  Drug use.  Emotional well-being.  Home and relationship well-being.  Sexual activity.  Eating habits.  Work and work Statistician.  Method of birth control.  Menstrual cycle.  Pregnancy history.  Screening You may have the following tests or measurements:  Height, weight, and BMI.  Diabetes screening. This is done by checking your blood sugar (glucose) after you have not eaten for a while (fasting).  Blood pressure.  Lipid and cholesterol levels. These may be checked every 5 years starting at age 24.  Skin check.  Hepatitis C blood test.  Hepatitis B blood test.  Sexually transmitted disease (STD) testing.  BRCA-related cancer screening. This may be done if you have a family history of breast, ovarian, tubal, or peritoneal cancers.  Pelvic exam and Pap test. This may be done every 3 years starting at age 58. Starting at age 26, this may be done every 5 years if you have a Pap test in combination with an HPV test.  Discuss your test results, treatment options, and if necessary, the need for more tests with your health care provider. Vaccines Your health care provider may recommend certain vaccines, such as:  Influenza vaccine. This is recommended every year.  Tetanus, diphtheria, and acellular pertussis (Tdap, Td) vaccine. You may need a Td booster every 10 years.  Varicella vaccine. You may need this if you have not been vaccinated.  HPV vaccine. If you are 51 or younger, you may need three doses over 6 months.  Measles, mumps, and rubella (MMR) vaccine. You may need at least one dose of MMR. You may also need a second dose.  Pneumococcal 13-valent conjugate (PCV13) vaccine. You may need  this if you have certain conditions and were not previously vaccinated.  Pneumococcal polysaccharide (PPSV23) vaccine. You may need one or two doses if you smoke cigarettes or if you have certain conditions.  Meningococcal vaccine. One dose is recommended if you are age 24-21 years and a first-year college student living in a residence hall, or if you have one of several medical conditions. You may also need additional booster doses.  Hepatitis A vaccine. You may need this if you have certain conditions or if you travel or work in places where you may be exposed to hepatitis A.  Hepatitis B vaccine. You may need this if you have certain conditions or if you travel or work in places where you may be exposed to hepatitis B.  Haemophilus influenzae type b (Hib) vaccine. You may need this if you have certain risk factors.  Talk to your health care provider about which screenings and vaccines you need and how often you need them. This information is not intended to replace advice given to you by your health care provider. Make sure you discuss any questions you have with your health care provider. Document Released: 01/15/2002 Document Revised: 08/08/2016 Document Reviewed: 09/20/2015 Elsevier Interactive Patient Education  [employer].

## 2018-11-13 LAB — CYTOLOGY - PAP: Diagnosis: NEGATIVE

## 2019-01-12 ENCOUNTER — Ambulatory Visit: Payer: Self-pay | Admitting: Adult Health

## 2019-01-12 ENCOUNTER — Encounter: Payer: Self-pay | Admitting: Nurse Practitioner

## 2019-01-12 ENCOUNTER — Ambulatory Visit: Payer: Self-pay | Admitting: Nurse Practitioner

## 2019-01-12 VITALS — BP 103/72 | HR 89 | Resp 16 | Ht 60.0 in | Wt 145.2 lb

## 2019-01-12 DIAGNOSIS — Z111 Encounter for screening for respiratory tuberculosis: Secondary | ICD-10-CM | POA: Diagnosis not present

## 2019-01-12 DIAGNOSIS — F988 Other specified behavioral and emotional disorders with onset usually occurring in childhood and adolescence: Secondary | ICD-10-CM | POA: Insufficient documentation

## 2019-01-12 DIAGNOSIS — F411 Generalized anxiety disorder: Secondary | ICD-10-CM | POA: Diagnosis not present

## 2019-01-12 DIAGNOSIS — L409 Psoriasis, unspecified: Secondary | ICD-10-CM | POA: Insufficient documentation

## 2019-01-12 DIAGNOSIS — IMO0001 Reserved for inherently not codable concepts without codable children: Secondary | ICD-10-CM | POA: Insufficient documentation

## 2019-01-12 MED ORDER — SERTRALINE HCL 50 MG PO TABS: 50.0000 mg | ORAL_TABLET | Freq: Every day | ORAL | 11 refills | Status: DC

## 2019-01-12 NOTE — Progress Notes (Signed)
[facility] [address] [place], [place] [address]  Internal MEDICINE  Office Visit Note  Patient Name: Tina ChyleKathryn Birchett Delawder  [birth year]  [id number]  Date of Service: 01/18/2019  Chief Complaint  Patient presents with  . Medical Management of Chronic Issues    follow up, tb testing paperwork, pt recently had natural birth of daughter and currently breastfeeding    The patient is here for follow up. Currently on zoloft 50mg  daily. Doing well. Getting ready to start a new job. She needs to have general physical form filled out so she can start her job next week.       Current Medication: Outpatient Encounter Medications as of 01/12/2019  Medication Sig  . sertraline (ZOLOFT) 50 MG tablet Take 1 tablet (50 mg total) by mouth daily.  . [DISCONTINUED] sertraline (ZOLOFT) 50 MG tablet Take 1 tablet (50 mg total) by mouth daily.  . Prenat-Fe Poly-Methfol-FA-DHA (VITAFOL ULTRA) 29-0.6-0.4-200 MG CAPS Take 29 mg by mouth daily.   No facility-administered encounter medications on file as of 01/12/2019.     Surgical History: Past Surgical History:  Procedure Laterality Date  . BREAST SURGERY     2 lumps removed- neg    Medical History: Past Medical History:  Diagnosis Date  . ADD (attention deficit disorder)   . Vitamin D deficiency     Family History: Family History  Problem Relation Age of Onset  . Ovarian cancer Paternal Grandmother   . Breast cancer Neg Hx   . Colon cancer Neg Hx   . Diabetes Neg Hx     Social History   Socioeconomic History  . Marital status: Single    Spouse name: Not on file  . Number of children: Not on file  . Years of education: Not on file  . Highest education level: Not on file  Occupational History  . Not on file  Social Needs  . Financial resource strain: Not on file  . Food insecurity:    Worry: Not on file    Inability: Not on file  . Transportation needs:    Medical: Not on file    Non-medical: Not on file   Tobacco Use  . Smoking status: Never Smoker  . Smokeless tobacco: Never Used  Substance and Sexual Activity  . Alcohol use: Yes    Frequency: Never    Comment: 3-4 x week  . Drug use: No  . Sexual activity: Yes    Birth control/protection: None  Lifestyle  . Physical activity:    Days per week: Not on file    Minutes per session: Not on file  . Stress: Not on file  Relationships  . Social connections:    Talks on phone: Not on file    Gets together: Not on file    Attends religious service: Not on file    Active member of club or organization: Not on file    Attends meetings of clubs or organizations: Not on file    Relationship status: Not on file  . Intimate partner violence:    Fear of current or ex partner: Not on file    Emotionally abused: Not on file    Physically abused: Not on file    Forced sexual activity: Not on file  Other Topics Concern  . Not on file  Social History Narrative  . Not on file      Review of Systems  Constitutional: Positive for fatigue. Negative for activity change, chills and unexpected weight  change.  HENT: Negative for congestion, postnasal drip, rhinorrhea, sneezing and sore throat.   Respiratory: Negative for cough, chest tightness, shortness of breath and wheezing.   Cardiovascular: Negative for chest pain and palpitations.  Gastrointestinal: Negative for abdominal pain, constipation, diarrhea, nausea and vomiting.  Endocrine: Negative for cold intolerance, heat intolerance, polydipsia and polyuria.  Musculoskeletal: Negative for arthralgias, back pain, joint swelling and neck pain.  Skin: Negative for rash.  Allergic/Immunologic: Negative for environmental allergies.  Neurological: Negative for dizziness, tremors, numbness and headaches.  Hematological: Negative for adenopathy. Does not bruise/bleed easily.  Psychiatric/Behavioral: Positive for dysphoric mood. Negative for behavioral problems (Depression), sleep disturbance and  suicidal ideas. The patient is nervous/anxious.     Today's Vitals   01/12/19 1204  BP: 103/72  Pulse: 89  Resp: 16  SpO2: 96%  Weight: 145 lb 3.2 oz (65.9 kg)  Height: 5' (1.524 m)   Body mass index is 28.36 kg/m.  Physical Exam Vitals signs and nursing note reviewed.  Constitutional:      General: She is not in acute distress.    Appearance: Normal appearance. She is well-developed. She is not diaphoretic.  HENT:     Head: Normocephalic and atraumatic.     Mouth/Throat:     Pharynx: No oropharyngeal exudate.  Eyes:     Pupils: Pupils are equal, round, and reactive to light.  Neck:     Musculoskeletal: Normal range of motion and neck supple.     Thyroid: No thyromegaly.     Vascular: No JVD.     Trachea: No tracheal deviation.  Cardiovascular:     Rate and Rhythm: Normal rate and regular rhythm.     Heart sounds: Normal heart sounds. No murmur. No friction rub. No gallop.   Pulmonary:     Effort: Pulmonary effort is normal. No respiratory distress.     Breath sounds: Normal breath sounds. No wheezing or rales.  Chest:     Chest wall: No tenderness.  Abdominal:     General: Bowel sounds are normal.     Palpations: Abdomen is soft.  Musculoskeletal: Normal range of motion.  Lymphadenopathy:     Cervical: No cervical adenopathy.  Skin:    General: Skin is warm and dry.  Neurological:     Mental Status: She is alert and oriented to person, place, and time.     Cranial Nerves: No cranial nerve deficit.  Psychiatric:        Attention and Perception: Attention and perception normal.        Mood and Affect: Mood is anxious and depressed.        Speech: Speech normal.        Behavior: Behavior normal. Behavior is cooperative.        Thought Content: Thought content normal.        Cognition and Memory: Cognition and memory normal.        Judgment: Judgment normal.    Assessment/Plan: 1. Generalized anxiety disorder Start sertraline 50mg  daily. Reassess at next  visit.  - sertraline (ZOLOFT) 50 MG tablet; Take 1 tablet (50 mg total) by mouth daily.  Dispense: 30 tablet; Refill: 11  2. Encounter for PPD test TB skin test administered today. Will return to office in 48 to 72 hours to have test read, positive or negative.   General Counseling: Napoleon FormKathryn verbalizes understanding of the findings of todays visit and agrees with plan of treatment. I have discussed any further diagnostic evaluation that may be needed  or ordered today. We also reviewed her medications today. she has been encouraged to call the office with any questions or concerns that should arise related to todays visit.  This patient was seen by Vincent Gros FNP Collaboration with Dr Lyndon Code as a part of collaborative care agreement  Meds ordered this encounter  Medications  . sertraline (ZOLOFT) 50 MG tablet    Sig: Take 1 tablet (50 mg total) by mouth daily.    Dispense:  30 tablet    Refill:  11    Order Specific Question:   Supervising Provider    Answer:   Lyndon Code [[id number]]    Time spent: 17 Minutes      Dr Lyndon Code Internal medicine

## 2019-01-18 DIAGNOSIS — F411 Generalized anxiety disorder: Secondary | ICD-10-CM | POA: Insufficient documentation

## 2019-01-18 DIAGNOSIS — Z111 Encounter for screening for respiratory tuberculosis: Secondary | ICD-10-CM | POA: Insufficient documentation

## 2019-02-26 ENCOUNTER — Other Ambulatory Visit: Payer: Self-pay

## 2019-02-26 ENCOUNTER — Ambulatory Visit: Payer: Self-pay | Admitting: Nurse Practitioner

## 2019-02-26 ENCOUNTER — Encounter: Payer: Self-pay | Admitting: Nurse Practitioner

## 2019-02-26 VITALS — BP 122/82 | HR 92 | Temp 98.3°F | Resp 16 | Ht 60.0 in | Wt 146.0 lb

## 2019-02-26 DIAGNOSIS — J028 Acute pharyngitis due to other specified organisms: Secondary | ICD-10-CM | POA: Diagnosis not present

## 2019-02-26 DIAGNOSIS — J029 Acute pharyngitis, unspecified: Secondary | ICD-10-CM

## 2019-02-26 DIAGNOSIS — B9689 Other specified bacterial agents as the cause of diseases classified elsewhere: Secondary | ICD-10-CM | POA: Diagnosis not present

## 2019-02-26 LAB — POCT RAPID STREP A (OFFICE): Rapid Strep A Screen: NEGATIVE

## 2019-02-26 MED ORDER — AMOXICILLIN 875 MG PO TABS: 875.0000 mg | ORAL_TABLET | Freq: Two times a day (BID) | ORAL | 0 refills | Status: DC

## 2019-02-26 NOTE — Progress Notes (Signed)
[facility] [address] [place], [place] [address]  Internal MEDICINE  Office Visit Note  Patient Name: Tina Rodgers  [id number]  [id number]  Date of Service: 02/26/2019   Pt is here for a sick visit.   Chief Complaint  Patient presents with  . Sore Throat    last couple days , worse in the morning , feels better today  . Nasal Congestion  . Cough    couging up phlegm ,denies fever,      The patient has had sore throat since Tuesday. Hurts most when she is swallowing or talking. Also feels dry. Has noted some post nasal drip. Denies fever, chills, or headache. Does not believe she has had positive contact with COVID 19 virus. She denies nausea, vomiting, or diarrhea.       Current Medication:  Outpatient Encounter Medications as of 02/26/2019  Medication Sig  . Prenat-Fe Poly-Methfol-FA-DHA (VITAFOL ULTRA) 29-0.6-0.4-200 MG CAPS Take 29 mg by mouth daily.  . sertraline (ZOLOFT) 50 MG tablet Take 1 tablet (50 mg total) by mouth daily.  Marland Kitchen amoxicillin (AMOXIL) 875 MG tablet Take 1 tablet (875 mg total) by mouth 2 (two) times daily.   No facility-administered encounter medications on file as of 02/26/2019.       Medical History: Past Medical History:  Diagnosis Date  . ADD (attention deficit disorder)   . Vitamin D deficiency      Today's Vitals   02/26/19 1045  BP: 122/82  Pulse: 92  Resp: 16  Temp: 98.3 F (36.8 C)  SpO2: 97%  Weight: 146 lb (66.2 kg)  Height: 5' (1.524 m)   Body mass index is 28.51 kg/m. Review of Systems  Constitutional: Positive for fatigue. Negative for chills, fever and unexpected weight change.  HENT: Positive for congestion, postnasal drip and sore throat. Negative for rhinorrhea and sneezing.   Respiratory: Negative for cough, chest tightness, shortness of breath and wheezing.   Cardiovascular: Negative for chest pain and palpitations.  Gastrointestinal: Negative for abdominal pain, constipation,  diarrhea, nausea and vomiting.  Musculoskeletal: Negative for arthralgias, back pain, joint swelling and neck pain.  Skin: Negative for rash.  Neurological: Positive for headaches. Negative for tremors and numbness.  Hematological: Negative for adenopathy. Does not bruise/bleed easily.  Psychiatric/Behavioral: Negative for behavioral problems (Depression), sleep disturbance and suicidal ideas. The patient is not nervous/anxious.     Physical Exam Vitals signs and nursing note reviewed.  Constitutional:      General: She is not in acute distress.    Appearance: Normal appearance. She is well-developed. She is not diaphoretic.  HENT:     Head: Normocephalic and atraumatic.     Right Ear: Tympanic membrane is bulging.     Left Ear: Tympanic membrane is bulging.     Mouth/Throat:     Pharynx: Posterior oropharyngeal erythema present. No oropharyngeal exudate.     Comments: Post nasal drip evident Eyes:     Pupils: Pupils are equal, round, and reactive to light.  Neck:     Musculoskeletal: Normal range of motion and neck supple.     Thyroid: No thyromegaly.     Vascular: No JVD.     Trachea: No tracheal deviation.  Cardiovascular:     Rate and Rhythm: Normal rate and regular rhythm.     Heart sounds: Normal heart sounds. No murmur. No friction rub. No gallop.   Pulmonary:     Effort: Pulmonary effort is normal. No respiratory distress.  Breath sounds: No wheezing or rales.  Chest:     Chest wall: No tenderness.  Abdominal:     General: Bowel sounds are normal.     Palpations: Abdomen is soft.  Musculoskeletal: Normal range of motion.  Lymphadenopathy:     Cervical: No cervical adenopathy.  Skin:    General: Skin is warm and dry.  Neurological:     Mental Status: She is alert and oriented to person, place, and time.     Cranial Nerves: No cranial nerve deficit.  Psychiatric:        Behavior: Behavior normal.        Thought Content: Thought content normal.         Judgment: Judgment normal.    Assessment/Plan: 1. Sore throat Negative strep screen. Samples of cepacol throat lozenges provided today. Use as needed and as indicated. May also take OTC tylenol as needed and as indicated. Gargle with warm salt water.  - POCT rapid strep A  2. Bacterial pharyngitis Start amoxicillin 875mg  twice daily for 10 days.  - amoxicillin (AMOXIL) 875 MG tablet; Take 1 tablet (875 mg total) by mouth 2 (two) times daily.  Dispense: 20 tablet; Refill: 0  General Counseling: ayan oxendine understanding of the findings of todays visit and agrees with plan of treatment. I have discussed any further diagnostic evaluation that may be needed or ordered today. We also reviewed her medications today. she has been encouraged to call the office with any questions or concerns that should arise related to todays visit.    Counseling:  Rest and increase fluids. Continue using OTC medication to control symptoms.   This patient was seen by Vincent Gros FNP Collaboration with Dr Lyndon Code as a part of collaborative care agreement  Orders Placed This Encounter  Procedures  . POCT rapid strep A    Meds ordered this encounter  Medications  . amoxicillin (AMOXIL) 875 MG tablet    Sig: Take 1 tablet (875 mg total) by mouth 2 (two) times daily.    Dispense:  20 tablet    Refill:  0    Order Specific Question:   Supervising Provider    Answer:   Lyndon Code [[id number]]    Time spent: 25 Minutes

## 2019-07-13 ENCOUNTER — Ambulatory Visit (INDEPENDENT_AMBULATORY_CARE_PROVIDER_SITE_OTHER): Payer: Self-pay | Admitting: Nurse Practitioner

## 2019-07-13 ENCOUNTER — Other Ambulatory Visit: Payer: Self-pay

## 2019-07-13 ENCOUNTER — Encounter: Payer: Self-pay | Admitting: Nurse Practitioner

## 2019-07-13 VITALS — BP 116/72 | HR 95 | Resp 16 | Ht 60.0 in | Wt 147.8 lb

## 2019-07-13 DIAGNOSIS — L209 Atopic dermatitis, unspecified: Secondary | ICD-10-CM | POA: Diagnosis not present

## 2019-07-13 DIAGNOSIS — F411 Generalized anxiety disorder: Secondary | ICD-10-CM | POA: Diagnosis not present

## 2019-07-13 NOTE — Progress Notes (Signed)
[facility] [address] [place], [place] [address]  Internal MEDICINE  Office Visit Note  Patient Name: Tina ChyleKathryn Birchett Mamaril  [birth year]  [id number]  Date of Service: 07/13/2019  Chief Complaint  Patient presents with  . Follow-up  . Anxiety    The patient is here for routine follow up visit. She is doing well. Feels a little fatigued at times. Continues to take sertraline 50mg  every day. Doing well. No negative side effects associated with taking this medication.       Current Medication: Outpatient Encounter Medications as of 07/13/2019  Medication Sig  . Prenat-Fe Poly-Methfol-FA-DHA (VITAFOL ULTRA) 29-0.6-0.4-200 MG CAPS Take 29 mg by mouth daily.  . sertraline (ZOLOFT) 50 MG tablet Take 1 tablet (50 mg total) by mouth daily.  . [DISCONTINUED] amoxicillin (AMOXIL) 875 MG tablet Take 1 tablet (875 mg total) by mouth 2 (two) times daily. (Patient not taking: Reported on 07/13/2019)   No facility-administered encounter medications on file as of 07/13/2019.     Surgical History: Past Surgical History:  Procedure Laterality Date  . BREAST SURGERY     2 lumps removed- neg    Medical History: Past Medical History:  Diagnosis Date  . ADD (attention deficit disorder)   . Vitamin D deficiency     Family History: Family History  Problem Relation Age of Onset  . Ovarian cancer Paternal Grandmother   . Breast cancer Neg Hx   . Colon cancer Neg Hx   . Diabetes Neg Hx     Social History   Socioeconomic History  . Marital status: Single    Spouse name: Not on file  . Number of children: Not on file  . Years of education: Not on file  . Highest education level: Not on file  Occupational History  . Not on file  Social Needs  . Financial resource strain: Not on file  . Food insecurity    Worry: Not on file    Inability: Not on file  . Transportation needs    Medical: Not on file    Non-medical: Not on file  Tobacco Use  . Smoking status: Never  Smoker  . Smokeless tobacco: Never Used  Substance and Sexual Activity  . Alcohol use: Yes    Frequency: Never    Comment: ocassionally  . Drug use: No  . Sexual activity: Yes    Birth control/protection: None  Lifestyle  . Physical activity    Days per week: Not on file    Minutes per session: Not on file  . Stress: Not on file  Relationships  . Social Musicianconnections    Talks on phone: Not on file    Gets together: Not on file    Attends religious service: Not on file    Active member of club or organization: Not on file    Attends meetings of clubs or organizations: Not on file    Relationship status: Not on file  . Intimate partner violence    Fear of current or ex partner: Not on file    Emotionally abused: Not on file    Physically abused: Not on file    Forced sexual activity: Not on file  Other Topics Concern  . Not on file  Social History Narrative  . Not on file      Review of Systems  Constitutional: Positive for fatigue. Negative for activity change, chills and unexpected weight change.  HENT: Negative for congestion, postnasal drip, rhinorrhea, sneezing and sore throat.  Respiratory: Negative for cough, chest tightness, shortness of breath and wheezing.   Cardiovascular: Negative for chest pain and palpitations.  Gastrointestinal: Negative for abdominal pain, constipation, diarrhea, nausea and vomiting.  Endocrine: Negative for cold intolerance, heat intolerance, polydipsia and polyuria.  Musculoskeletal: Negative for arthralgias, back pain, joint swelling and neck pain.  Skin: Negative for rash.       Small, dry rash on the sides of the nose  Allergic/Immunologic: Negative for environmental allergies.  Neurological: Negative for dizziness, tremors, numbness and headaches.  Hematological: Negative for adenopathy. Does not bruise/bleed easily.  Psychiatric/Behavioral: Positive for dysphoric mood. Negative for behavioral problems (Depression), sleep disturbance  and suicidal ideas. The patient is nervous/anxious.     Today's Vitals   07/13/19 1048  BP: 116/72  Pulse: 95  Resp: 16  SpO2: 98%  Weight: 147 lb 12.8 oz (67 kg)   Body mass index is 28.87 kg/m.  Physical Exam Vitals signs and nursing note reviewed.  Constitutional:      General: She is not in acute distress.    Appearance: Normal appearance. She is well-developed. She is not diaphoretic.  HENT:     Head: Normocephalic and atraumatic.     Mouth/Throat:     Pharynx: No oropharyngeal exudate.  Eyes:     Pupils: Pupils are equal, round, and reactive to light.  Neck:     Musculoskeletal: Normal range of motion and neck supple.     Thyroid: No thyromegaly.     Vascular: No JVD.     Trachea: No tracheal deviation.  Cardiovascular:     Rate and Rhythm: Normal rate and regular rhythm.     Heart sounds: Normal heart sounds. No murmur. No friction rub. No gallop.   Pulmonary:     Effort: Pulmonary effort is normal. No respiratory distress.     Breath sounds: Normal breath sounds. No wheezing or rales.  Chest:     Chest wall: No tenderness.  Abdominal:     General: Bowel sounds are normal.     Palpations: Abdomen is soft.  Musculoskeletal: Normal range of motion.  Lymphadenopathy:     Cervical: No cervical adenopathy.  Skin:    General: Skin is warm and dry.     Comments: Fine, red, itchy rash present at the base of both nares.   Neurological:     Mental Status: She is alert and oriented to person, place, and time.     Cranial Nerves: No cranial nerve deficit.  Psychiatric:        Attention and Perception: Attention and perception normal.        Mood and Affect: Mood is anxious and depressed.        Speech: Speech normal.        Behavior: Behavior normal. Behavior is cooperative.        Thought Content: Thought content normal.        Cognition and Memory: Cognition and memory normal.        Judgment: Judgment normal.    Assessment/Plan: 1. Atopic dermatitis,  unspecified type Advised using topical hydrocortisone cream, OTC, three to four times daily as needed for itching and irritation.   2. Generalized anxiety disorder Continue to take sertralinie 50mg  daily. Refill as needed.  General Counseling: Napoleon FormKathryn verbalizes understanding of the findings of todays visit and agrees with plan of treatment. I have discussed any further diagnostic evaluation that may be needed or ordered today. We also reviewed her medications today. she has been encouraged  to call the office with any questions or concerns that should arise related to todays visit.  This patient was seen by Leretha Pol FNP Collaboration with Dr Lavera Guise as a part of collaborative care agreement  Time spent: 6 Minutes      Dr Lavera Guise Internal medicine

## 2019-11-13 ENCOUNTER — Encounter: Payer: BC Managed Care – PPO | Admitting: Certified Nurse Midwife

## 2019-12-08 ENCOUNTER — Encounter: Payer: BC Managed Care – PPO | Admitting: Certified Nurse Midwife

## 2019-12-10 ENCOUNTER — Ambulatory Visit: Payer: Self-pay | Admitting: Adult Health

## 2019-12-10 VITALS — Temp 98.1°F | Ht 60.0 in | Wt 147.0 lb

## 2019-12-10 DIAGNOSIS — L03211 Cellulitis of face: Secondary | ICD-10-CM

## 2019-12-10 DIAGNOSIS — R591 Generalized enlarged lymph nodes: Secondary | ICD-10-CM

## 2019-12-10 MED ORDER — DOXYCYCLINE MONOHYDRATE 100 MG PO TABS: 100.0000 mg | ORAL_TABLET | Freq: Two times a day (BID) | ORAL | 0 refills | Status: DC

## 2019-12-10 NOTE — Progress Notes (Signed)
[facility] [address] [place], [place] [address]  Internal MEDICINE  Telephone Visit  Patient Name: Tina Rodgers  [id number]  [id number]  Date of Service: 12/10/2019  I connected with the patient at 1125 by telephone and verified the patients identity using two identifiers.   I discussed the limitations, risks, security and privacy concerns of performing an evaluation and management service by telephone and the availability of in person appointments. I also discussed with the patient that there may be a patient responsible charge related to the service.  The patient expressed understanding and agrees to proceed.    Chief Complaint  Patient presents with  . Telephone Assessment  . Telephone Screen  . Sore Throat    gland swollen  . Blister    nose     HPI  Pt is seen via telephone.  She reports 2 days ago she noticed a large pimple on her nose.  She attempted to squeeze it and now it is red, and slightly bruised.  Later she noticed her lymph node on left side of neck is swollen, and is more swollen today.  She reports they are tender to touch. Denies fever, does have mildly sore throat.  She denies sinus or ear pressure. The area of this pimple is warm, and red, and has intermittent drainage.    Current Medication: Outpatient Encounter Medications as of 12/10/2019  Medication Sig  . sertraline (ZOLOFT) 50 MG tablet Take 1 tablet (50 mg total) by mouth daily.  Marland Kitchen doxycycline (ADOXA) 100 MG tablet Take 1 tablet (100 mg total) by mouth 2 (two) times daily.  . Prenat-Fe Poly-Methfol-FA-DHA (VITAFOL ULTRA) 29-0.6-0.4-200 MG CAPS Take 29 mg by mouth daily. (Patient not taking: Reported on 12/10/2019)   No facility-administered encounter medications on file as of 12/10/2019.    Surgical History: Past Surgical History:  Procedure Laterality Date  . BREAST SURGERY     2 lumps removed- neg    Medical History: Past Medical History:  Diagnosis Date  . ADD  (attention deficit disorder)   . Vitamin D deficiency     Family History: Family History  Problem Relation Age of Onset  . Ovarian cancer Paternal Grandmother   . Breast cancer Neg Hx   . Colon cancer Neg Hx   . Diabetes Neg Hx     Social History   Socioeconomic History  . Marital status: Single    Spouse name: Not on file  . Number of children: Not on file  . Years of education: Not on file  . Highest education level: Not on file  Occupational History  . Not on file  Tobacco Use  . Smoking status: Never Smoker  . Smokeless tobacco: Never Used  Substance and Sexual Activity  . Alcohol use: Yes    Comment: ocassionally  . Drug use: No  . Sexual activity: Yes    Birth control/protection: None  Other Topics Concern  . Not on file  Social History Narrative  . Not on file   Social Determinants of Health   Financial Resource Strain:   . Difficulty of Paying Living Expenses: Not on file  Food Insecurity:   . Worried About Programme researcher, broadcasting/film/video in the Last Year: Not on file  . Ran Out of Food in the Last Year: Not on file  Transportation Needs:   . Lack of Transportation (Medical): Not on file  . Lack of Transportation (Non-Medical): Not on file  Physical Activity:   . Days  of Exercise per Week: Not on file  . Minutes of Exercise per Session: Not on file  Stress:   . Feeling of Stress : Not on file  Social Connections:   . Frequency of Communication with Friends and Family: Not on file  . Frequency of Social Gatherings with Friends and Family: Not on file  . Attends Religious Services: Not on file  . Active Member of Clubs or Organizations: Not on file  . Attends Archivist Meetings: Not on file  . Marital Status: Not on file  Intimate Partner Violence:   . Fear of Current or Ex-Partner: Not on file  . Emotionally Abused: Not on file  . Physically Abused: Not on file  . Sexually Abused: Not on file      Review of Systems  Constitutional: Negative  for chills, fatigue and unexpected weight change.  HENT: Negative for congestion, rhinorrhea, sneezing and sore throat.   Eyes: Negative for photophobia, pain and redness.  Respiratory: Negative for cough, chest tightness and shortness of breath.   Cardiovascular: Negative for chest pain and palpitations.  Gastrointestinal: Negative for abdominal pain, constipation, diarrhea, nausea and vomiting.  Endocrine: Negative.   Genitourinary: Negative for dysuria and frequency.  Musculoskeletal: Negative for arthralgias, back pain, joint swelling and neck pain.  Skin: Negative for rash.  Allergic/Immunologic: Negative.   Neurological: Negative for tremors and numbness.  Hematological: Negative for adenopathy. Does not bruise/bleed easily.  Psychiatric/Behavioral: Negative for behavioral problems and sleep disturbance. The patient is not nervous/anxious.     Vital Signs: Temp 98.1 F (36.7 C)   Ht 5' (1.524 m)   Wt 147 lb (66.7 kg)   BMI 28.71 kg/m    Observation/Objective:  Well sounding, NAD noted.   Assessment/Plan: 1. Cellulitis diffuse, face Could be MRSA infection, we treat with trial of doxy, pt instructed to follow up in office if symptoms worsen, or fail to improve.  2. Lymphadenopathy of head and neck Advised patient to take entire course of antibiotics as prescribed with food. Pt should return to clinic in 7-10 days if symptoms fail to improve or new symptoms develop.  - doxycycline (ADOXA) 100 MG tablet; Take 1 tablet (100 mg total) by mouth 2 (two) times daily.  Dispense: 20 tablet; Refill: 0  General Counseling: lezli danek understanding of the findings of today's phone visit and agrees with plan of treatment. I have discussed any further diagnostic evaluation that may be needed or ordered today. We also reviewed her medications today. she has been encouraged to call the office with any questions or concerns that should arise related to todays visit.    No orders  of the defined types were placed in this encounter.   Meds ordered this encounter  Medications  . doxycycline (ADOXA) 100 MG tablet    Sig: Take 1 tablet (100 mg total) by mouth 2 (two) times daily.    Dispense:  20 tablet    Refill:  0    Time spent: [place] AGNP-C Internal medicine

## 2020-01-04 ENCOUNTER — Ambulatory Visit: Payer: PRIVATE HEALTH INSURANCE | Attending: Internal Medicine

## 2020-01-04 DIAGNOSIS — Z20822 Contact with and (suspected) exposure to covid-19: Secondary | ICD-10-CM | POA: Insufficient documentation

## 2020-01-05 LAB — NOVEL CORONAVIRUS, NAA: SARS-CoV-2, NAA: NOT DETECTED

## 2020-01-11 ENCOUNTER — Other Ambulatory Visit: Payer: Self-pay

## 2020-01-11 DIAGNOSIS — F411 Generalized anxiety disorder: Secondary | ICD-10-CM

## 2020-01-11 MED ORDER — SERTRALINE HCL 50 MG PO TABS: 50.0000 mg | ORAL_TABLET | Freq: Every day | ORAL | 0 refills | Status: DC

## 2020-01-12 ENCOUNTER — Telehealth: Payer: Self-pay

## 2020-01-12 NOTE — Telephone Encounter (Signed)
LMOM TO CONFIRM AND SCREEN FOR 01-14-20 OV.

## 2020-01-14 ENCOUNTER — Ambulatory Visit: Payer: No Typology Code available for payment source | Admitting: Nurse Practitioner

## 2020-01-30 ENCOUNTER — Ambulatory Visit: Payer: PRIVATE HEALTH INSURANCE | Attending: Internal Medicine

## 2020-01-30 DIAGNOSIS — Z23 Encounter for immunization: Secondary | ICD-10-CM | POA: Insufficient documentation

## 2020-01-30 NOTE — Progress Notes (Signed)
   Covid-19 Vaccination Clinic  Name:  Tina Rodgers    MRN: [id number] DOB: 1991-05-25  01/30/2020  Tina Rodgers was observed post Covid-19 immunization for 15 minutes without incidence. She was provided with Vaccine Information Sheet and instruction to access the V-Safe system.   Tina Rodgers was instructed to call 911 with any severe reactions post vaccine: Marland Kitchen Difficulty breathing  . Swelling of your face and throat  . A fast heartbeat  . A bad rash all over your body  . Dizziness and weakness    Immunizations Administered    Name Date Dose VIS Date Route   Moderna COVID-19 Vaccine 01/30/2020 10:15 AM 0.5 mL 11/03/2019 Intramuscular   Manufacturer: Moderna   Lot: [id number]   NDC: 99094-000-50

## 2020-02-03 ENCOUNTER — Telehealth: Payer: Self-pay

## 2020-02-03 NOTE — Telephone Encounter (Signed)
LMOM TO SCREEN AND CONFIRM FOR 02-05-20 OV.

## 2020-02-05 ENCOUNTER — Other Ambulatory Visit: Payer: Self-pay

## 2020-02-05 ENCOUNTER — Ambulatory Visit: Payer: No Typology Code available for payment source | Admitting: Nurse Practitioner

## 2020-02-05 ENCOUNTER — Encounter: Payer: Self-pay | Admitting: Nurse Practitioner

## 2020-02-05 VITALS — BP 104/60 | HR 96 | Temp 96.9°F | Resp 16 | Ht 60.0 in | Wt 148.0 lb

## 2020-02-05 DIAGNOSIS — R5383 Other fatigue: Secondary | ICD-10-CM

## 2020-02-05 DIAGNOSIS — F411 Generalized anxiety disorder: Secondary | ICD-10-CM | POA: Diagnosis not present

## 2020-02-05 MED ORDER — SERTRALINE HCL 50 MG PO TABS: 50.0000 mg | ORAL_TABLET | Freq: Every day | ORAL | 5 refills | Status: DC

## 2020-02-05 NOTE — Progress Notes (Signed)
[facility], [place] [address]  Internal MEDICINE  Office Visit Note  Patient Name: Tina Rodgers  [id number]  [id number]  Date of Service: 02/05/2020  Chief Complaint  Patient presents with  . Anxiety  . ADD  . Medication Refill    The patient is here for routine follow up. The patient is working at day care center. She got her first [place] 19 vaccine 2/27/021. Her second one is 02/27/2020. She had some arm swelling at injection site, and otherwise did fine, with no negative side effects. She currently takes zoloft 50mg  daily. She states this is working well and does need to have refills for this today.       Current Medication: Outpatient Encounter Medications as of 02/05/2020  Medication Sig  . sertraline (ZOLOFT) 50 MG tablet Take 1 tablet (50 mg total) by mouth daily.  . [DISCONTINUED] sertraline (ZOLOFT) 50 MG tablet Take 1 tablet (50 mg total) by mouth daily.  Marland Kitchen doxycycline (ADOXA) 100 MG tablet Take 1 tablet (100 mg total) by mouth 2 (two) times daily. (Patient not taking: Reported on 02/05/2020)  . Prenat-Fe Poly-Methfol-FA-DHA (VITAFOL ULTRA) 29-0.6-0.4-200 MG CAPS Take 29 mg by mouth daily. (Patient not taking: Reported on 02/05/2020)   No facility-administered encounter medications on file as of 02/05/2020.    Surgical History: Past Surgical History:  Procedure Laterality Date  . BREAST SURGERY     2 lumps removed- neg    Medical History: Past Medical History:  Diagnosis Date  . ADD (attention deficit disorder)   . Vitamin D deficiency     Family History: Family History  Problem Relation Age of Onset  . Ovarian cancer Paternal Grandmother   . Breast cancer Neg Hx   . Colon cancer Neg Hx   . Diabetes Neg Hx     Social History   Socioeconomic History  . Marital status: Single    Spouse name: Not on file  . Number of children: Not on file  . Years of education: Not on file  . Highest education level: Not  on file  Occupational History  . Not on file  Tobacco Use  . Smoking status: Never Smoker  . Smokeless tobacco: Never Used  Substance and Sexual Activity  . Alcohol use: Yes    Comment: ocassionally  . Drug use: No  . Sexual activity: Yes    Birth control/protection: None  Other Topics Concern  . Not on file  Social History Narrative  . Not on file   Social Determinants of Health   Financial Resource Strain:   . Difficulty of Paying Living Expenses: Not on file  Food Insecurity:   . Worried About Charity fundraiser in the Last Year: Not on file  . Ran Out of Food in the Last Year: Not on file  Transportation Needs:   . Lack of Transportation (Medical): Not on file  . Lack of Transportation (Non-Medical): Not on file  Physical Activity:   . Days of Exercise per Week: Not on file  . Minutes of Exercise per Session: Not on file  Stress:   . Feeling of Stress : Not on file  Social Connections:   . Frequency of Communication with Friends and Family: Not on file  . Frequency of Social Gatherings with Friends and Family: Not on file  . Attends Religious Services: Not on file  . Active Member of Clubs or Organizations: Not on file  . Attends Archivist  Meetings: Not on file  . Marital Status: Not on file  Intimate Partner Violence:   . Fear of Current or Ex-Partner: Not on file  . Emotionally Abused: Not on file  . Physically Abused: Not on file  . Sexually Abused: Not on file      Review of Systems  Constitutional: Negative for chills, fatigue and unexpected weight change.  HENT: Negative for congestion, rhinorrhea, sneezing and sore throat.   Respiratory: Negative for cough, chest tightness, shortness of breath and wheezing.   Cardiovascular: Negative for chest pain and palpitations.  Gastrointestinal: Negative for abdominal pain, constipation, diarrhea, nausea and vomiting.  Endocrine: Negative for cold intolerance, heat intolerance, polydipsia and  polyuria.  Musculoskeletal: Negative for arthralgias, back pain, joint swelling and neck pain.  Skin: Negative for rash.  Allergic/Immunologic: Negative.  Negative for environmental allergies.  Neurological: Negative for dizziness, tremors, numbness and headaches.  Hematological: Negative for adenopathy. Does not bruise/bleed easily.  Psychiatric/Behavioral: Negative for behavioral problems and sleep disturbance. The patient is nervous/anxious.        Well managed with current dose sertraline .    Today's Vitals   02/05/20 0953  BP: 104/60  Pulse: 96  Resp: 16  Temp: (!) 96.9 F (36.1 C)  SpO2: 99%  Weight: 148 lb (67.1 kg)  Height: 5' (1.524 m)   Body mass index is 28.9 kg/m.  Physical Exam Vitals and nursing note reviewed.  Constitutional:      General: She is not in acute distress.    Appearance: Normal appearance. She is well-developed. She is not diaphoretic.  HENT:     Head: Normocephalic and atraumatic.     Mouth/Throat:     Pharynx: No oropharyngeal exudate.  Eyes:     Pupils: Pupils are equal, round, and reactive to light.  Neck:     Thyroid: No thyromegaly.     Vascular: No JVD.     Trachea: No tracheal deviation.  Cardiovascular:     Rate and Rhythm: Normal rate and regular rhythm.     Heart sounds: Normal heart sounds. No murmur. No friction rub. No gallop.   Pulmonary:     Effort: Pulmonary effort is normal. No respiratory distress.     Breath sounds: Normal breath sounds. No wheezing or rales.  Chest:     Chest wall: No tenderness.  Abdominal:     Palpations: Abdomen is soft.  Musculoskeletal:        General: Normal range of motion.     Cervical back: Normal range of motion and neck supple.  Lymphadenopathy:     Cervical: No cervical adenopathy.  Skin:    General: Skin is warm and dry.  Neurological:     Mental Status: She is alert and oriented to person, place, and time.     Cranial Nerves: No cranial nerve deficit.  Psychiatric:         Behavior: Behavior normal.        Thought Content: Thought content normal.        Judgment: Judgment normal.    Assessment/Plan: 1. Other fatigue Likely related to busy lifestyle and stress. Stable. Continue to monitor.   2. Generalized anxiety disorder Well managed. Continue sertraline 50mg  daily. Refills provided today.  - sertraline (ZOLOFT) 50 MG tablet; Take 1 tablet (50 mg total) by mouth daily.  Dispense: 30 tablet; Refill: 5  General Counseling: ronisha herringshaw understanding of the findings of todays visit and agrees with plan of treatment. I have discussed any  further diagnostic evaluation that may be needed or ordered today. We also reviewed her medications today. she has been encouraged to call the office with any questions or concerns that should arise related to todays visit.   This patient was seen by Vincent Gros FNP Collaboration with Dr Lyndon Code as a part of collaborative care agreement  Meds ordered this encounter  Medications  . sertraline (ZOLOFT) 50 MG tablet    Sig: Take 1 tablet (50 mg total) by mouth daily.    Dispense:  30 tablet    Refill:  5    Order Specific Question:   Supervising Provider    Answer:   Wyline Mood [[id number]]    Total time spent: 20 Minutes   Time spent includes review of chart, medications, test results, and follow up plan with the patient.      Dr Lyndon Code Internal medicine

## 2020-02-15 ENCOUNTER — Ambulatory Visit (INDEPENDENT_AMBULATORY_CARE_PROVIDER_SITE_OTHER): Payer: No Typology Code available for payment source | Admitting: Certified Nurse Midwife

## 2020-02-15 ENCOUNTER — Other Ambulatory Visit: Payer: PRIVATE HEALTH INSURANCE

## 2020-02-15 ENCOUNTER — Other Ambulatory Visit: Payer: Self-pay

## 2020-02-15 ENCOUNTER — Encounter: Payer: Self-pay | Admitting: Certified Nurse Midwife

## 2020-02-15 VITALS — BP 91/62 | HR 90 | Ht 60.0 in | Wt 146.2 lb

## 2020-02-15 DIAGNOSIS — F32A Depression, unspecified: Secondary | ICD-10-CM

## 2020-02-15 DIAGNOSIS — F419 Anxiety disorder, unspecified: Secondary | ICD-10-CM | POA: Diagnosis not present

## 2020-02-15 DIAGNOSIS — F329 Major depressive disorder, single episode, unspecified: Secondary | ICD-10-CM

## 2020-02-15 DIAGNOSIS — Z01419 Encounter for gynecological examination (general) (routine) without abnormal findings: Secondary | ICD-10-CM

## 2020-02-15 NOTE — Progress Notes (Signed)
ANNUAL PREVENTATIVE CARE GYN  ENCOUNTER NOTE  Subjective:       Tina Rodgers is a 29 y.o. G39P0101 female here for a routine annual gynecologic exam.    Anxiety and depression managed by PCP.   Denies difficulty breathing or respiratory distress, chest pain, abdominal pain, excessive vaginal bleeding, dysuria, and leg pain or swelling.    Gynecologic History  Patient's last menstrual period was 01/23/2020 (exact date). Period Cycle (Days): 26 Period Duration (Days): Five (5) to seven (7) Period Pattern: Regular Menstrual Flow: Moderate Menstrual Control: Maxi pad Menstrual Control Change Freq (Hours): Four (4) Dysmenorrhea: (!) Mild Dysmenorrhea Symptoms: Cramping  Contraception: condoms  Last Pap: 11/2018. Results were: normal  Obstetric History  OB History  Gravida Para Term Preterm AB Living  1 1   1   1   SAB TAB Ectopic Multiple Live Births        0 1    # Outcome Date GA Lbr Len/2nd Weight Sex Delivery Anes PTL Lv  1 Preterm 07/15/18 [redacted]w[redacted]d 05:05 / 03:10 6 lb 5.6 oz (2.88 kg) F Vag-Spont Local  LIV    Past Medical History:  Diagnosis Date  . ADD (attention deficit disorder)   . Vitamin D deficiency     Past Surgical History:  Procedure Laterality Date  . BREAST SURGERY     2 lumps removed- neg    Current Outpatient Medications on File Prior to Visit  Medication Sig Dispense Refill  . sertraline (ZOLOFT) 50 MG tablet Take 1 tablet (50 mg total) by mouth daily. 30 tablet 5   No current facility-administered medications on file prior to visit.    Allergies  Allergen Reactions  . Sulfa Antibiotics Other (See Comments)    flush    Social History   Socioeconomic History  . Marital status: Single    Spouse name: Not on file  . Number of children: Not on file  . Years of education: Not on file  . Highest education level: Not on file  Occupational History  . Not on file  Tobacco Use  . Smoking status: Never Smoker  . Smokeless tobacco:  Never Used  Substance and Sexual Activity  . Alcohol use: Yes    Comment: ocassionally  . Drug use: No  . Sexual activity: Yes    Birth control/protection: Condom  Other Topics Concern  . Not on file  Social History Narrative  . Not on file   Social Determinants of Health   Financial Resource Strain:   . Difficulty of Paying Living Expenses:   Food Insecurity:   . Worried About Charity fundraiser in the Last Year:   . Arboriculturist in the Last Year:   Transportation Needs:   . Film/video editor (Medical):   Marland Kitchen Lack of Transportation (Non-Medical):   Physical Activity:   . Days of Exercise per Week:   . Minutes of Exercise per Session:   Stress:   . Feeling of Stress :   Social Connections:   . Frequency of Communication with Friends and Family:   . Frequency of Social Gatherings with Friends and Family:   . Attends Religious Services:   . Active Member of Clubs or Organizations:   . Attends Archivist Meetings:   Marland Kitchen Marital Status:   Intimate Partner Violence:   . Fear of Current or Ex-Partner:   . Emotionally Abused:   Marland Kitchen Physically Abused:   . Sexually Abused:     Family  History  Problem Relation Age of Onset  . Ovarian cancer Paternal Grandmother   . Breast cancer Neg Hx   . Colon cancer Neg Hx   . Diabetes Neg Hx     The following portions of the patient's history were reviewed and updated as appropriate: allergies, current medications, past family history, past medical history, past social history, past surgical history and problem list.  Review of Systems  ROS negative except as noted above. Information obtained from patient.    Objective:   BP 91/62   Pulse 90   Ht 5' (1.524 m)   Wt 146 lb 3 oz (66.3 kg)   LMP 01/23/2020 (Exact Date)   Breastfeeding No   BMI 28.55 kg/m    CONSTITUTIONAL: Well-developed, well-nourished female in no acute distress.   PSYCHIATRIC: Normal mood and affect. Normal behavior. Normal judgment and  thought content.  NEUROLGIC: Alert and oriented to person, place, and time. Normal muscle tone coordination. No cranial nerve deficit noted.  HENT:  Normocephalic, atraumatic, External right and left ear normal.   EYES: Conjunctivae and EOM are normal. Pupils are equal and round.   NECK: Normal range of motion, supple, no masses.  Normal thyroid.   SKIN: Skin is warm and dry. No rash noted. Not diaphoretic. No erythema. No pallor.  CARDIOVASCULAR: Normal heart rate noted, regular rhythm, no murmur.  RESPIRATORY: Clear to auscultation bilaterally. Effort and breath sounds normal, no problems with respiration noted.  BREASTS: Symmetric in size. No masses, skin changes, nipple drainage, or lymphadenopathy.  ABDOMEN: Soft, normal bowel sounds, no distention noted.  No tenderness, rebound or guarding.   PELVIC:  External Genitalia: Normal  Vagina: Normal  Cervix: Normal  Uterus: Normal  Adnexa: Normal   MUSCULOSKELETAL: Normal range of motion. No tenderness.  No cyanosis, clubbing, or edema.  2+ distal pulses.  LYMPHATIC: No Axillary, Supraclavicular, or Inguinal Adenopathy.  Assessment:   Annual gynecologic examination 29 y.o.   Contraception: condoms   Overweight   Problem List Items Addressed This Visit      Other   Anxiety and depression    Other Visit Diagnoses    Well woman exam    -  Primary      Plan:   Pap: Not needed  Labs: Not needed   Routine preventative health maintenance measures emphasized: Exercise/Diet/Weight control, Tobacco Warnings, Alcohol/Substance use risks and Stress Management; see AVS  Reviewed red flag symptoms and when to call  Return to Clinic - 1 Year for [place] and PAP   Gunnar Bulla, CNM [facility], [facility] 02/15/20 10:37 AM

## 2020-02-15 NOTE — Patient Instructions (Signed)
Preventive Care 65-30 Years Old, Female Preventive care refers to visits with your health care provider and lifestyle choices that can promote health and wellness. This includes:  A yearly physical exam. This may also be called an annual well check.  Regular dental visits and eye exams.  Immunizations.  Screening for certain conditions.  Healthy lifestyle choices, such as eating a healthy diet, getting regular exercise, not using drugs or products that contain nicotine and tobacco, and limiting alcohol use. What can I expect for my preventive care visit? Physical exam Your health care provider will check your:  Height and weight. This may be used to calculate body mass index (BMI), which tells if you are at a healthy weight.  Heart rate and blood pressure.  Skin for abnormal spots. Counseling Your health care provider may ask you questions about your:  Alcohol, tobacco, and drug use.  Emotional well-being.  Home and relationship well-being.  Sexual activity.  Eating habits.  Work and work Statistician.  Method of birth control.  Menstrual cycle.  Pregnancy history. What immunizations do I need?  Influenza (flu) vaccine  This is recommended every year. Tetanus, diphtheria, and pertussis (Tdap) vaccine  You may need a Td booster every 10 years. Varicella (chickenpox) vaccine  You may need this if you have not been vaccinated. Human papillomavirus (HPV) vaccine  If recommended by your health care provider, you may need three doses over 6 months. Measles, mumps, and rubella (MMR) vaccine  You may need at least one dose of MMR. You may also need a second dose. Meningococcal conjugate (MenACWY) vaccine  One dose is recommended if you are age 45-21 years and a first-year college student living in a residence hall, or if you have one of several medical conditions. You may also need additional booster doses. Pneumococcal conjugate (PCV13) vaccine  You may need  this if you have certain conditions and were not previously vaccinated. Pneumococcal polysaccharide (PPSV23) vaccine  You may need one or two doses if you smoke cigarettes or if you have certain conditions. Hepatitis A vaccine  You may need this if you have certain conditions or if you travel or work in places where you may be exposed to hepatitis A. Hepatitis B vaccine  You may need this if you have certain conditions or if you travel or work in places where you may be exposed to hepatitis B. Haemophilus influenzae type b (Hib) vaccine  You may need this if you have certain conditions. You may receive vaccines as individual doses or as more than one vaccine together in one shot (combination vaccines). Talk with your health care provider about the risks and benefits of combination vaccines. What tests do I need?  Blood tests  Lipid and cholesterol levels. These may be checked every 5 years starting at age 79.  Hepatitis C test.  Hepatitis B test. Screening  Diabetes screening. This is done by checking your blood sugar (glucose) after you have not eaten for a while (fasting).  Sexually transmitted disease (STD) testing.  BRCA-related cancer screening. This may be done if you have a family history of breast, ovarian, tubal, or peritoneal cancers.  Pelvic exam and Pap test. This may be done every 3 years starting at age 60. Starting at age 13, this may be done every 5 years if you have a Pap test in combination with an HPV test. Talk with your health care provider about your test results, treatment options, and if necessary, the need for more tests.  Follow these instructions at home: Eating and drinking   Eat a diet that includes fresh fruits and vegetables, whole grains, lean protein, and low-fat dairy.  Take vitamin and mineral supplements as recommended by your health care provider.  Do not drink alcohol if: ? Your health care provider tells you not to drink. ? You are  pregnant, may be pregnant, or are planning to become pregnant.  If you drink alcohol: ? Limit how much you have to 0-1 drink a day. ? Be aware of how much alcohol is in your drink. In the [place] one drink equals one 12 oz bottle of beer (355 mL), one 5 oz glass of wine (148 mL), or one 1 oz glass of hard liquor (44 mL). Lifestyle  Take daily care of your teeth and gums.  Stay active. Exercise for at least 30 minutes on 5 or more days each week.  Do not use any products that contain nicotine or tobacco, such as cigarettes, e-cigarettes, and chewing tobacco. If you need help quitting, ask your health care provider.  If you are sexually active, practice safe sex. Use a condom or other form of birth control (contraception) in order to prevent pregnancy and STIs (sexually transmitted infections). If you plan to become pregnant, see your health care provider for a preconception visit. What's next?  Visit your health care provider once a year for a well check visit.  Ask your health care provider how often you should have your eyes and teeth checked.  Stay up to date on all vaccines. This information is not intended to replace advice given to you by your health care provider. Make sure you discuss any questions you have with your health care provider. Document Revised: 07/31/2018 Document Reviewed: 07/31/2018 Elsevier Patient Education  2020 [employer].

## 2020-02-27 ENCOUNTER — Ambulatory Visit: Payer: PRIVATE HEALTH INSURANCE | Attending: Internal Medicine

## 2020-02-27 DIAGNOSIS — Z23 Encounter for immunization: Secondary | ICD-10-CM

## 2020-02-27 NOTE — Progress Notes (Signed)
   Covid-19 Vaccination Clinic  Name:  Shawneen Deetz    MRN: [id number] DOB: [birth year]  02/27/2020  Ms. Lowder was observed post Covid-19 immunization for 15 minutes without incident. She was provided with Vaccine Information Sheet and instruction to access the V-Safe system.   Ms. Vessey was instructed to call 911 with any severe reactions post vaccine: Marland Kitchen Difficulty breathing  . Swelling of face and throat  . A fast heartbeat  . A bad rash all over body  . Dizziness and weakness   Immunizations Administered    Name Date Dose VIS Date Route   Moderna COVID-19 Vaccine 02/27/2020 11:21 AM 0.5 mL 11/03/2019 Intramuscular   Manufacturer: Gala Murdoch   Lot: [id number]   NDC: 07371-062-69

## 2020-03-02 ENCOUNTER — Ambulatory Visit: Payer: PRIVATE HEALTH INSURANCE

## 2020-03-11 ENCOUNTER — Ambulatory Visit: Payer: No Typology Code available for payment source | Admitting: Adult Health

## 2020-03-11 ENCOUNTER — Encounter: Payer: Self-pay | Admitting: Adult Health

## 2020-03-11 VITALS — Temp 97.5°F | Resp 16 | Ht 60.0 in | Wt 146.0 lb

## 2020-03-11 DIAGNOSIS — J988 Other specified respiratory disorders: Secondary | ICD-10-CM

## 2020-03-11 MED ORDER — AZITHROMYCIN 250 MG PO TABS: ORAL_TABLET | ORAL | 0 refills | Status: DC

## 2020-03-11 NOTE — Progress Notes (Signed)
[facility] [place], [place] [address]  Internal MEDICINE  Telephone Visit  Patient Name: Tina Rodgers  [id number]  [id number]  Date of Service: 03/11/2020  I connected with the patient at 1214 by telephone and verified the patients identity using two identifiers.   I discussed the limitations, risks, security and privacy concerns of performing an evaluation and management service by telephone and the availability of in person appointments. I also discussed with the patient that there may be a patient responsible charge related to the service.  The patient expressed understanding and agrees to proceed.    Chief Complaint  Patient presents with  . Telephone Screen  . Telephone Assessment  . Cough    bad cough since saturday, meds not helping     HPI  Pt seen via telephone.  She reports for the past 5 days she has been having cold symptoms.  She reports runny nose, sneezing, and sore throat.  She has been taking otc cold medicines and has gotten some better.  Then 3 days ago she noticed it settling in her chest.  She has cough, that is not productive.  Although she feels like she is tying to cough something up. She continues to have this cough, and sore throat. Seh tried mucinex, with no results. She denies fever. Has been vaccinated for covid.   Current Medication: Outpatient Encounter Medications as of 03/11/2020  Medication Sig  . sertraline (ZOLOFT) 50 MG tablet Take 1 tablet (50 mg total) by mouth daily.  Marland Kitchen azithromycin (ZITHROMAX) 250 MG tablet Take as directed.   No facility-administered encounter medications on file as of 03/11/2020.    Surgical History: Past Surgical History:  Procedure Laterality Date  . BREAST SURGERY     2 lumps removed- neg    Medical History: Past Medical History:  Diagnosis Date  . ADD (attention deficit disorder)   . Vitamin D deficiency     Family History: Family History  Problem Relation Age of Onset   . Ovarian cancer Paternal Grandmother   . Breast cancer Neg Hx   . Colon cancer Neg Hx   . Diabetes Neg Hx     Social History   Socioeconomic History  . Marital status: Single    Spouse name: Not on file  . Number of children: Not on file  . Years of education: Not on file  . Highest education level: Not on file  Occupational History  . Not on file  Tobacco Use  . Smoking status: Never Smoker  . Smokeless tobacco: Never Used  Substance and Sexual Activity  . Alcohol use: Yes    Comment: ocassionally  . Drug use: No  . Sexual activity: Yes    Birth control/protection: None  Other Topics Concern  . Not on file  Social History Narrative  . Not on file   Social Determinants of Health   Financial Resource Strain:   . Difficulty of Paying Living Expenses:   Food Insecurity:   . Worried About Charity fundraiser in the Last Year:   . Arboriculturist in the Last Year:   Transportation Needs:   . Film/video editor (Medical):   Marland Kitchen Lack of Transportation (Non-Medical):   Physical Activity:   . Days of Exercise per Week:   . Minutes of Exercise per Session:   Stress:   . Feeling of Stress :   Social Connections:   . Frequency of Communication with Friends and Family:   .  Frequency of Social Gatherings with Friends and Family:   . Attends Religious Services:   . Active Member of Clubs or Organizations:   . Attends Banker Meetings:   Marland Kitchen Marital Status:   Intimate Partner Violence:   . Fear of Current or Ex-Partner:   . Emotionally Abused:   Marland Kitchen Physically Abused:   . Sexually Abused:       Review of Systems  Constitutional: Negative for chills, fatigue and unexpected weight change.  HENT: Positive for congestion, postnasal drip and sore throat. Negative for rhinorrhea and sneezing.   Eyes: Negative for photophobia, pain and redness.  Respiratory: Positive for cough and chest tightness. Negative for shortness of breath.        Chest congestion   Cardiovascular: Negative for chest pain and palpitations.  Gastrointestinal: Negative for abdominal pain, constipation, diarrhea, nausea and vomiting.  Endocrine: Negative.   Genitourinary: Negative for dysuria and frequency.  Musculoskeletal: Negative for arthralgias, back pain, joint swelling and neck pain.  Skin: Negative for rash.  Allergic/Immunologic: Negative.   Neurological: Negative for tremors and numbness.  Hematological: Negative for adenopathy. Does not bruise/bleed easily.  Psychiatric/Behavioral: Negative for behavioral problems and sleep disturbance. The patient is not nervous/anxious.     Vital Signs: Temp (!) 97.5 F (36.4 C)   Resp 16   Ht 5' (1.524 m)   Wt 146 lb (66.2 kg)   BMI 28.51 kg/m    Observation/Objective:  Well sounding, NAD noted.    Assessment/Plan: 1. Respiratory infection Advised patient to take entire course of antibiotics as prescribed with food. Pt should return to clinic in 7-10 days if symptoms fail to improve or new symptoms develop.  - azithromycin (ZITHROMAX) 250 MG tablet; Take as directed.  Dispense: 6 tablet; Refill: 0  General Counseling: angelita harnack understanding of the findings of today's phone visit and agrees with plan of treatment. I have discussed any further diagnostic evaluation that may be needed or ordered today. We also reviewed her medications today. she has been encouraged to call the office with any questions or concerns that should arise related to todays visit.    No orders of the defined types were placed in this encounter.   Meds ordered this encounter  Medications  . azithromycin (ZITHROMAX) 250 MG tablet    Sig: Take as directed.    Dispense:  6 tablet    Refill:  0    Time spent: 20 Minutes    Blima Ledger AGNP-C Internal medicine

## 2020-06-27 ENCOUNTER — Other Ambulatory Visit: Payer: Self-pay | Admitting: Nurse Practitioner

## 2020-06-27 ENCOUNTER — Telehealth: Payer: Self-pay

## 2020-06-27 DIAGNOSIS — J988 Other specified respiratory disorders: Secondary | ICD-10-CM

## 2020-06-27 MED ORDER — AZITHROMYCIN 250 MG PO TABS: ORAL_TABLET | ORAL | 0 refills | Status: DC

## 2020-06-27 NOTE — Telephone Encounter (Signed)
Hey. I sent z-pack for her. Take as directed for 5 days. She should rest and increase fluids. Take over-the-counter medications as needed and as indicated to help with acute symptoms.

## 2020-06-29 ENCOUNTER — Ambulatory Visit: Payer: No Typology Code available for payment source | Admitting: Adult Health

## 2020-07-07 ENCOUNTER — Telehealth: Payer: Self-pay

## 2020-07-07 NOTE — Telephone Encounter (Signed)
Confirmed and screened for office visit on 8/9 

## 2020-07-10 ENCOUNTER — Other Ambulatory Visit: Payer: Self-pay | Admitting: Nurse Practitioner

## 2020-07-10 DIAGNOSIS — F411 Generalized anxiety disorder: Secondary | ICD-10-CM

## 2020-07-11 ENCOUNTER — Other Ambulatory Visit: Payer: Self-pay

## 2020-07-11 ENCOUNTER — Ambulatory Visit: Payer: No Typology Code available for payment source | Admitting: Adult Health

## 2020-07-11 ENCOUNTER — Encounter: Payer: Self-pay | Admitting: Adult Health

## 2020-07-11 VITALS — BP 111/60 | HR 96 | Temp 97.5°F | Resp 16 | Ht 60.0 in | Wt 137.6 lb

## 2020-07-11 DIAGNOSIS — L0292 Furuncle, unspecified: Secondary | ICD-10-CM | POA: Diagnosis not present

## 2020-07-11 DIAGNOSIS — Z0000 Encounter for general adult medical examination without abnormal findings: Secondary | ICD-10-CM

## 2020-07-11 DIAGNOSIS — F411 Generalized anxiety disorder: Secondary | ICD-10-CM | POA: Diagnosis not present

## 2020-07-11 NOTE — Progress Notes (Signed)
[facility] [address] [place], [place] [address]  Internal MEDICINE  Office Visit Note  Patient Name: Tina Rodgers  [id number]  [id number]  Date of Service: 07/11/2020  Chief Complaint  Patient presents with  . Follow-up    TDAP   . Quality Metric Gaps    Hep C    HPI  Pt is here for follow up.  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  She denies any issues.  She is complaining today of a boil on her sternum.  She reports this is a common occurrence yearly for her.  It typically is self limiting once it drains.  She has not done any topical therapy at this time.    Current Medication: Outpatient Encounter Medications as of 07/11/2020  Medication Sig  . sertraline (ZOLOFT) 50 MG tablet Take 1 tablet (50 mg total) by mouth daily.  . [DISCONTINUED] azithromycin (ZITHROMAX) 250 MG tablet Take as directed.   No facility-administered encounter medications on file as of 07/11/2020.    Surgical History: Past Surgical History:  Procedure Laterality Date  . BREAST SURGERY     2 lumps removed- neg    Medical History: Past Medical History:  Diagnosis Date  . ADD (attention deficit disorder)   . Vitamin D deficiency     Family History: Family History  Problem Relation Age of Onset  . Ovarian cancer Paternal Grandmother   . Breast cancer Neg Hx   . Colon cancer Neg Hx   . Diabetes Neg Hx     Social History   Socioeconomic History  . Marital status: Single    Spouse name: Not on file  . Number of children: Not on file  . Years of education: Not on file  . Highest education level: Not on file  Occupational History  . Not on file  Tobacco Use  . Smoking status: Never Smoker  . Smokeless tobacco: Never Used  Vaping Use  . Vaping Use: Never used  Substance and Sexual Activity  . Alcohol use: Yes    Comment: ocassionally  . Drug use: No  . Sexual activity: Yes    Birth control/protection: None  Other Topics  Concern  . Not on file  Social History Narrative  . Not on file   Social Determinants of Health   Financial Resource Strain:   . Difficulty of Paying Living Expenses:   Food Insecurity:   . Worried About Programme researcher, broadcasting/film/video in the Last Year:   . Barista in the Last Year:   Transportation Needs:   . Freight forwarder (Medical):   Marland Kitchen Lack of Transportation (Non-Medical):   Physical Activity:   . Days of Exercise per Week:   . Minutes of Exercise per Session:   Stress:   . Feeling of Stress :   Social Connections:   . Frequency of Communication with Friends and Family:   . Frequency of Social Gatherings with Friends and Family:   . Attends Religious Services:   . Active Member of Clubs or Organizations:   . Attends Banker Meetings:   Marland Kitchen Marital Status:   Intimate Partner Violence:   . Fear of Current or Ex-Partner:   . Emotionally Abused:   Marland Kitchen Physically Abused:   . Sexually Abused:       Review of Systems  Constitutional: Negative for chills, fatigue and unexpected weight change.  HENT: Negative for congestion, rhinorrhea, sneezing and sore  throat.   Eyes: Negative for photophobia, pain and redness.  Respiratory: Negative for cough, chest tightness and shortness of breath.   Cardiovascular: Negative for chest pain and palpitations.  Gastrointestinal: Negative for abdominal pain, constipation, diarrhea, nausea and vomiting.  Endocrine: Negative.   Genitourinary: Negative for dysuria and frequency.  Musculoskeletal: Negative for arthralgias, back pain, joint swelling and neck pain.  Skin: Negative for rash.  Allergic/Immunologic: Negative.   Neurological: Negative for tremors and numbness.  Hematological: Negative for adenopathy. Does not bruise/bleed easily.  Psychiatric/Behavioral: Negative for behavioral problems and sleep disturbance. The patient is not nervous/anxious.     Vital Signs: BP 111/60   Pulse 96   Temp (!) 97.5 F (36.4 C)    Resp 16   Ht 5' (1.524 m)   Wt 137 lb 9.6 oz (62.4 kg)   SpO2 98%   BMI 26.87 kg/m    Physical Exam Vitals and nursing note reviewed.  Constitutional:      General: She is not in acute distress.    Appearance: She is well-developed. She is not diaphoretic.  HENT:     Head: Normocephalic and atraumatic.     Mouth/Throat:     Pharynx: No oropharyngeal exudate.  Eyes:     Pupils: Pupils are equal, round, and reactive to light.  Neck:     Thyroid: No thyromegaly.     Vascular: No JVD.     Trachea: No tracheal deviation.  Cardiovascular:     Rate and Rhythm: Normal rate and regular rhythm.     Heart sounds: Normal heart sounds. No murmur heard.  No friction rub. No gallop.   Pulmonary:     Effort: Pulmonary effort is normal. No respiratory distress.     Breath sounds: Normal breath sounds. No wheezing or rales.  Chest:     Chest wall: No tenderness.  Abdominal:     Palpations: Abdomen is soft.     Tenderness: There is no abdominal tenderness. There is no guarding.  Musculoskeletal:        General: Normal range of motion.     Cervical back: Normal range of motion and neck supple.  Lymphadenopathy:     Cervical: No cervical adenopathy.  Skin:    General: Skin is warm and dry.  Neurological:     Mental Status: She is alert and oriented to person, place, and time.     Cranial Nerves: No cranial nerve deficit.  Psychiatric:        Behavior: Behavior normal.        Thought Content: Thought content normal.        Judgment: Judgment normal.    Assessment/Plan: 1. Encounter for general health examination Discussed PHm.  2. Boil Use warm compress for 20 minutes at a time.  If no improvement return to clinic.   3. Generalized anxiety disorder Stable, continue current therapy.   General Counseling: Tina Rodgers understanding of the findings of todays visit and agrees with plan of treatment. I have discussed any further diagnostic evaluation that may be needed or  ordered today. We also reviewed her medications today. she has been encouraged to call the office with any questions or concerns that should arise related to todays visit.    No orders of the defined types were placed in this encounter.   No orders of the defined types were placed in this encounter.   Time spent: 20 Minutes   This patient was seen by Blima Ledger AGNP-C in Collaboration with  Dr Lavera Guise as a part of collaborative care agreement     Kendell Bane AGNP-C Internal medicine

## 2020-08-04 ENCOUNTER — Telehealth: Payer: Self-pay

## 2020-08-04 NOTE — Telephone Encounter (Signed)
Lmom to confirm and screen for 08-09-20 ov. 

## 2020-08-09 ENCOUNTER — Ambulatory Visit: Payer: No Typology Code available for payment source | Admitting: Nurse Practitioner

## 2020-09-12 ENCOUNTER — Other Ambulatory Visit: Payer: PRIVATE HEALTH INSURANCE

## 2020-09-13 ENCOUNTER — Ambulatory Visit: Payer: BC Managed Care – PPO | Admitting: Hospice and Palliative Medicine

## 2020-09-13 ENCOUNTER — Encounter: Payer: Self-pay | Admitting: Hospice and Palliative Medicine

## 2020-09-13 VITALS — Temp 97.8°F | Resp 16 | Ht 60.0 in | Wt 137.0 lb

## 2020-09-13 DIAGNOSIS — J302 Other seasonal allergic rhinitis: Secondary | ICD-10-CM

## 2020-09-13 MED ORDER — FLUTICASONE PROPIONATE 50 MCG/ACT NA SUSP: 2.0000 | Freq: Every day | NASAL | 6 refills | Status: DC

## 2020-09-13 NOTE — Progress Notes (Signed)
[facility] [address] [place], [place] [address]  Internal MEDICINE  Telephone Visit  Patient Name: Tina Rodgers  [id number]  [id number]  Date of Service: 09/13/2020  I connected with the patient at 1151 by telephone and verified the patients identity using two identifiers.   I discussed the limitations, risks, security and privacy concerns of performing an evaluation and management service by telephone and the availability of in person appointments. I also discussed with the patient that there may be a patient responsible charge related to the service.  The patient expressed understanding and agrees to proceed.    Chief Complaint  Patient presents with  . Telephone Assessment    phone visit...[phone]  . Telephone Screen  . Sore Throat    constant itchy throat, for 3 days.  tried cold meds and cough drops some help but comes back  . sneezing    occationally    HPI Patient is being seen today for a sick visit She is complaining of itching throat on the roof of her mouth--improved with cough drops as well as hot liquids but itching returns soon after doing this Symptoms started on Saturday Mild rhinorrhea--clear nasal discharge also accompanied by sneezing She doesn't feel as though this is a "cold" maybe allergies but has never had allergies before She reports no tongue or lip swelling, no complications with her breathing Has been taking cold and flu OTC medicine with no relief  Afebrile  Current Medication: Outpatient Encounter Medications as of 09/13/2020  Medication Sig  . sertraline (ZOLOFT) 50 MG tablet TAKE 1 TABLET BY MOUTH EVERY DAY  . fluticasone (FLONASE) 50 MCG/ACT nasal spray Place 2 sprays into both nostrils daily.   No facility-administered encounter medications on file as of 09/13/2020.    Surgical History: Past Surgical History:  Procedure Laterality Date  . BREAST SURGERY     2 lumps removed- neg    Medical  History: Past Medical History:  Diagnosis Date  . ADD (attention deficit disorder)   . Vitamin D deficiency     Family History: Family History  Problem Relation Age of Onset  . Ovarian cancer Paternal Grandmother   . Atrial fibrillation Father   . Breast cancer Neg Hx   . Colon cancer Neg Hx   . Diabetes Neg Hx     Social History   Socioeconomic History  . Marital status: Single    Spouse name: Not on file  . Number of children: Not on file  . Years of education: Not on file  . Highest education level: Not on file  Occupational History  . Not on file  Tobacco Use  . Smoking status: Never Smoker  . Smokeless tobacco: Never Used  Vaping Use  . Vaping Use: Never used  Substance and Sexual Activity  . Alcohol use: Yes    Comment: ocassionally  . Drug use: No  . Sexual activity: Yes    Birth control/protection: None  Other Topics Concern  . Not on file  Social History Narrative  . Not on file   Social Determinants of Health   Financial Resource Strain:   . Difficulty of Paying Living Expenses: Not on file  Food Insecurity:   . Worried About Programme researcher, broadcasting/film/video in the Last Year: Not on file  . Ran Out of Food in the Last Year: Not on file  Transportation Needs:   . Lack of Transportation (Medical): Not on file  . Lack of Transportation (Non-Medical): Not on  file  Physical Activity:   . Days of Exercise per Week: Not on file  . Minutes of Exercise per Session: Not on file  Stress:   . Feeling of Stress : Not on file  Social Connections:   . Frequency of Communication with Friends and Family: Not on file  . Frequency of Social Gatherings with Friends and Family: Not on file  . Attends Religious Services: Not on file  . Active Member of Clubs or Organizations: Not on file  . Attends Banker Meetings: Not on file  . Marital Status: Not on file  Intimate Partner Violence:   . Fear of Current or Ex-Partner: Not on file  . Emotionally Abused: Not on  file  . Physically Abused: Not on file  . Sexually Abused: Not on file   Review of Systems  Constitutional: Negative for chills, diaphoresis and fatigue.  HENT: Positive for rhinorrhea and sneezing. Negative for ear pain, postnasal drip and sinus pressure.        Throat irritation--itch  Eyes: Negative for photophobia, discharge, redness, itching and visual disturbance.  Respiratory: Negative for cough, shortness of breath and wheezing.   Cardiovascular: Negative for chest pain, palpitations and leg swelling.  Gastrointestinal: Negative for abdominal pain, constipation, diarrhea, nausea and vomiting.  Genitourinary: Negative for dysuria and flank pain.  Musculoskeletal: Negative for arthralgias, back pain, gait problem and neck pain.  Skin: Negative for color change.  Allergic/Immunologic: Negative for environmental allergies and food allergies.  Neurological: Negative for dizziness and headaches.  Hematological: Does not bruise/bleed easily.  Psychiatric/Behavioral: Negative for agitation, behavioral problems (depression) and hallucinations.    Vital Signs: Temp 97.8 F (36.6 C)   Resp 16   Ht 5' (1.524 m)   Wt 137 lb (62.1 kg)   BMI 26.76 kg/m    Observation/Objective: No acute distress noted    Assessment/Plan: 1. Seasonal allergic rhinitis, unspecified trigger Advised to take OTC allergy medication such as zyrtec, also advised to use Flonase to assist with symptom management - fluticasone (FLONASE) 50 MCG/ACT nasal spray; Place 2 sprays into both nostrils daily.  Dispense: 16 g; Refill: 6  General Counseling: Tina Rodgers verbalizes understanding of the findings of today's phone visit and agrees with plan of treatment. I have discussed any further diagnostic evaluation that may be needed or ordered today. We also reviewed her medications today. she has been encouraged to call the office with any questions or concerns that should arise related to todays visit.   Meds ordered  this encounter  Medications  . fluticasone (FLONASE) 50 MCG/ACT nasal spray    Sig: Place 2 sprays into both nostrils daily.    Dispense:  16 g    Refill:  6     Time spent: 20 Minutes   Lubertha Basque. Lashonta Pilling AGNP-C Internal medicine

## 2020-10-24 ENCOUNTER — Other Ambulatory Visit: Payer: Self-pay

## 2020-10-24 ENCOUNTER — Other Ambulatory Visit: Payer: Self-pay | Admitting: Hospice and Palliative Medicine

## 2020-10-24 MED ORDER — AMOXICILLIN-POT CLAVULANATE 875-125 MG PO TABS: 1.0000 | ORAL_TABLET | Freq: Two times a day (BID) | ORAL | 0 refills | Status: DC

## 2020-10-24 NOTE — Telephone Encounter (Signed)
Pt called that congestion,cold ,running nose ,no fever as per dr Welton Flakes send Augmentin  For 7 days take with food and also take Claritin OTC daily and used Flonase

## 2021-02-20 ENCOUNTER — Encounter: Payer: Self-pay | Admitting: Certified Nurse Midwife

## 2021-02-20 ENCOUNTER — Other Ambulatory Visit (HOSPITAL_COMMUNITY)
Admission: RE | Admit: 2021-02-20 | Discharge: 2021-02-20 | Disposition: A | Discharge status: Home / Self Care | Payer: BC Managed Care – PPO | Source: Ambulatory Visit | Attending: Certified Nurse Midwife | Admitting: Certified Nurse Midwife

## 2021-02-20 ENCOUNTER — Ambulatory Visit (INDEPENDENT_AMBULATORY_CARE_PROVIDER_SITE_OTHER): Payer: BC Managed Care – PPO | Admitting: Certified Nurse Midwife

## 2021-02-20 ENCOUNTER — Other Ambulatory Visit: Payer: Self-pay

## 2021-02-20 VITALS — BP 97/62 | HR 81 | Ht 60.0 in | Wt 137.6 lb

## 2021-02-20 DIAGNOSIS — Z01419 Encounter for gynecological examination (general) (routine) without abnormal findings: Secondary | ICD-10-CM

## 2021-02-20 DIAGNOSIS — Z124 Encounter for screening for malignant neoplasm of cervix: Secondary | ICD-10-CM | POA: Insufficient documentation

## 2021-02-20 NOTE — Patient Instructions (Signed)
Preventive Care 21-31 Years Old, Female Preventive care refers to lifestyle choices and visits with your health care provider that can promote health and wellness. This includes:  A yearly physical exam. This is also called an annual wellness visit.  Regular dental and eye exams.  Immunizations.  Screening for certain conditions.  Healthy lifestyle choices, such as: ? Eating a healthy diet. ? Getting regular exercise. ? Not using drugs or products that contain nicotine and tobacco. ? Limiting alcohol use. What can I expect for my preventive care visit? Physical exam Your health care provider may check your:  Height and weight. These may be used to calculate your BMI (body mass index). BMI is a measurement that tells if you are at a healthy weight.  Heart rate and blood pressure.  Body temperature.  Skin for abnormal spots. Counseling Your health care provider may ask you questions about your:  Past medical problems.  Family's medical history.  Alcohol, tobacco, and drug use.  Emotional well-being.  Home life and relationship well-being.  Sexual activity.  Diet, exercise, and sleep habits.  Work and work environment.  Access to firearms.  Method of birth control.  Menstrual cycle.  Pregnancy history. What immunizations do I need? Vaccines are usually given at various ages, according to a schedule. Your health care provider will recommend vaccines for you based on your age, medical history, and lifestyle or other factors, such as travel or where you work.   What tests do I need? Blood tests  Lipid and cholesterol levels. These may be checked every 5 years starting at age 20.  Hepatitis C test.  Hepatitis B test. Screening  Diabetes screening. This is done by checking your blood sugar (glucose) after you have not eaten for a while (fasting).  STD (sexually transmitted disease) testing, if you are at risk.  BRCA-related cancer screening. This may be  done if you have a family history of breast, ovarian, tubal, or peritoneal cancers.  Pelvic exam and Pap test. This may be done every 3 years starting at age 21. Starting at age 30, this may be done every 5 years if you have a Pap test in combination with an HPV test. Talk with your health care provider about your test results, treatment options, and if necessary, the need for more tests.   Follow these instructions at home: Eating and drinking  Eat a healthy diet that includes fresh fruits and vegetables, whole grains, lean protein, and low-fat dairy products.  Take vitamin and mineral supplements as recommended by your health care provider.  Do not drink alcohol if: ? Your health care provider tells you not to drink. ? You are pregnant, may be pregnant, or are planning to become pregnant.  If you drink alcohol: ? Limit how much you have to 0-1 drink a day. ? Be aware of how much alcohol is in your drink. In the [place] one drink equals one 12 oz bottle of beer (355 mL), one 5 oz glass of wine (148 mL), or one 1 oz glass of hard liquor (44 mL).   Lifestyle  Take daily care of your teeth and gums. Brush your teeth every morning and night with fluoride toothpaste. Floss one time each day.  Stay active. Exercise for at least 30 minutes 5 or more days each week.  Do not use any products that contain nicotine or tobacco, such as cigarettes, e-cigarettes, and chewing tobacco. If you need help quitting, ask your health care provider.  Do not   use drugs.  If you are sexually active, practice safe sex. Use a condom or other form of protection to prevent STIs (sexually transmitted infections).  If you do not wish to become pregnant, use a form of birth control. If you plan to become pregnant, see your health care provider for a prepregnancy visit.  Find healthy ways to cope with stress, such as: ? Meditation, yoga, or listening to music. ? Journaling. ? Talking to a trusted  person. ? Spending time with friends and family. Safety  Always wear your seat belt while driving or riding in a vehicle.  Do not drive: ? If you have been drinking alcohol. Do not ride with someone who has been drinking. ? When you are tired or distracted. ? While texting.  Wear a helmet and other protective equipment during sports activities.  If you have firearms in your house, make sure you follow all gun safety procedures.  Seek help if you have been physically or sexually abused. What's next?  Go to your health care provider once a year for an annual wellness visit.  Ask your health care provider how often you should have your eyes and teeth checked.  Stay up to date on all vaccines. This information is not intended to replace advice given to you by your health care provider. Make sure you discuss any questions you have with your health care provider. Document Revised: 07/17/2020 Document Reviewed: 07/31/2018 Elsevier Patient Education  2021 Elsevier Inc.  

## 2021-02-20 NOTE — Progress Notes (Signed)
ANNUAL PREVENTATIVE CARE GYN  ENCOUNTER NOTE  Subjective:       Tina Rodgers is a 30 y.o. G2P0101 female here for a routine annual gynecologic exam. No current questions or concerns.   Denies difficulty breathing or respiratory distress, chest pain, abdominal pain, excessive vaginal bleeding, dysuria, and leg pain or swelling.    Gynecologic History  Patient's last menstrual period was 02/12/2021. Period Cycle (Days): 30 Period Duration (Days): 8 Period Pattern: Regular Menstrual Flow: Moderate Menstrual Control: Maxi pad Menstrual Control Change Freq (Hours): 3 Dysmenorrhea: (!) Mild Dysmenorrhea Symptoms: Cramping  Contraception: condoms   Upstream - 02/20/21 1015      Pregnancy Intention Screening   Does the patient want to become pregnant in the next year? Ok Either Way    Does the patient's partner want to become pregnant in the next year? No    Would the patient like to discuss contraceptive options today? No      Contraception Wrap Up   Current Method Female Condom          The pregnancy intention screening data noted above was reviewed. Potential methods of contraception were discussed. The patient elected to proceed with Female Condom  .  Last Pap: 2019. Results were: normal  Obstetric History  OB History  Gravida Para Term Preterm AB Living  1 1   1   1   SAB IAB Ectopic Multiple Live Births        0 1    # Outcome Date GA Lbr Len/2nd Weight Sex Delivery Anes PTL Lv  1 Preterm 07/15/18 [redacted]w[redacted]d 05:05 / 03:10 6 lb 5.6 oz (2.88 kg) F Vag-Spont Local  LIV    Past Medical History:  Diagnosis Date  . ADD (attention deficit disorder)   . Vitamin D deficiency     Past Surgical History:  Procedure Laterality Date  . BREAST SURGERY     2 lumps removed- neg    Current Outpatient Medications on File Prior to Visit  Medication Sig Dispense Refill  . cholecalciferol (VITAMIN D3) 25 MCG (1000 UNIT) tablet Take 1,000 Units by mouth daily.    . Multiple  Vitamins-Minerals (ONE-A-DAY WOMENS PO) Take by mouth.    . sertraline (ZOLOFT) 50 MG tablet TAKE 1 TABLET BY MOUTH EVERY DAY 90 tablet 2   No current facility-administered medications on file prior to visit.    Allergies  Allergen Reactions  . Sulfa Antibiotics Other (See Comments)    flush    Social History   Socioeconomic History  . Marital status: Single    Spouse name: Not on file  . Number of children: Not on file  . Years of education: Not on file  . Highest education level: Not on file  Occupational History  . Not on file  Tobacco Use  . Smoking status: Never Smoker  . Smokeless tobacco: Never Used  Vaping Use  . Vaping Use: Never used  Substance and Sexual Activity  . Alcohol use: Yes    Comment: ocassionally  . Drug use: No  . Sexual activity: Yes    Birth control/protection: Condom  Other Topics Concern  . Not on file  Social History Narrative  . Not on file   Social Determinants of Health   Financial Resource Strain: Not on file  Food Insecurity: Not on file  Transportation Needs: Not on file  Physical Activity: Not on file  Stress: Not on file  Social Connections: Not on file  Intimate Partner Violence: Not  on file    Family History  Problem Relation Age of Onset  . Ovarian cancer Paternal Grandmother   . Atrial fibrillation Father   . Breast cancer Neg Hx   . Colon cancer Neg Hx   . Diabetes Neg Hx     The following portions of the patient's history were reviewed and updated as appropriate: allergies, current medications, past family history, past medical history, past social history, past surgical history and problem list.  Review of Systems  ROS negative except as noted above. Information obtained from patient.    Objective:   BP 97/62   Pulse 81   Ht 5' (1.524 m)   Wt 137 lb 9.6 oz (62.4 kg)   LMP 02/12/2021   Breastfeeding No   BMI 26.87 kg/m    CONSTITUTIONAL: Well-developed, well-nourished female in no acute distress.    PSYCHIATRIC: Normal mood and affect. Normal behavior. Normal judgment and thought content.  NEUROLGIC: Alert and oriented to person,   NECK: Normal range of motion, supple, no masses.  Normal thyroid.   SKIN: Skin is warm and dry. No rash noted. Not diaphoretic. No erythema. No pallor.  CARDIOVASCULAR: Normal heart rate noted, regular rhythm, no murmur.  RESPIRATORY: Clear to auscultation bilaterally. Effort and breath sounds normal, no problems with respiration noted.  BREASTS: Symmetric in size. No masses, skin changes, nipple drainage, or lymphadenopathy.  ABDOMEN: Soft, normal bowel sounds, no distention noted.  No tenderness, rebound or guarding.   PELVIC:  External Genitalia: Normal  Vagina: Normal  Cervix: Normal, Pap collected  Uterus: Normal  Adnexa: Normal  MUSCULOSKELETAL: Normal range of motion. No tenderness.  No cyanosis, clubbing, or edema.  2+ distal pulses.  LYMPHATIC: No Axillary, Supraclavicular, or Inguinal Adenopathy.  Assessment:   Annual gynecologic examination 31 y.o.   Contraception: condoms   Overweight   Problem List Items Addressed This Visit   None   Visit Diagnoses    Well woman exam    -  Primary   Relevant Orders   Cytology - PAP   Screening for cervical cancer       Relevant Orders   Cytology - PAP      Plan:   Pap: Pap Co Test  Labs: Managed by PCP  Routine preventative health maintenance measures emphasized: Exercise/Diet/Weight control, Tobacco Warnings, Alcohol/Substance use risks and Stress Management; see AVS  Reviewed red flag symptoms and when to call  Return to Clinic - 1 Year for [facility] or sooner if needed  Serafina Royals, CNM  [facility], [facility] 02/20/21 10:27 AM

## 2021-02-23 LAB — CYTOLOGY - PAP
Comment: NEGATIVE
Diagnosis: NEGATIVE
High risk HPV: NEGATIVE

## 2021-03-30 ENCOUNTER — Ambulatory Visit: Payer: BC Managed Care – PPO | Admitting: Physician Assistant

## 2021-03-30 ENCOUNTER — Encounter: Payer: Self-pay | Admitting: Physician Assistant

## 2021-03-30 ENCOUNTER — Other Ambulatory Visit: Payer: Self-pay

## 2021-03-30 DIAGNOSIS — J0100 Acute maxillary sinusitis, unspecified: Secondary | ICD-10-CM | POA: Diagnosis not present

## 2021-03-30 DIAGNOSIS — R5383 Other fatigue: Secondary | ICD-10-CM

## 2021-03-30 MED ORDER — AMOXICILLIN-POT CLAVULANATE 875-125 MG PO TABS: 1.0000 | ORAL_TABLET | Freq: Two times a day (BID) | ORAL | 0 refills | Status: DC

## 2021-03-30 NOTE — Progress Notes (Signed)
[facility] [address] [place], [place] [address]  Internal MEDICINE  Office Visit Note  Patient Name: Tina Rodgers  [id number]  [id number]  Date of Service: 04/02/2021  Chief Complaint  Patient presents with  . Acute Visit    cold like symtoms one week ago, was better, now back again, sinus pressure and headaches, at home covid test neg, fully vaccinated      HPI Pt is here for a sick visit. -Cold around Easter and stopped a few days after. Then felt like allergies. 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  -Normally no problem with allergies, but has been taking claritin the last year thinking she might be developing allergies. Stopped it recently bc it wasn't helping. Has been taking tylenol cold and flu the past few days not helping much.  -Congestion, but no cough. Minor sore throat. Pressure and sinus headaches, but no ear pain. No SOB or wheezing.  -Does have sudafed congestion and will try that now.  -negative home covid    Current Medication:  Outpatient Encounter Medications as of 03/30/2021  Medication Sig  . amoxicillin-clavulanate (AUGMENTIN) 875-125 MG tablet Take 1 tablet by mouth 2 (two) times daily.  . cholecalciferol (VITAMIN D3) 25 MCG (1000 UNIT) tablet Take 1,000 Units by mouth daily.  . Multiple Vitamins-Minerals (ONE-A-DAY WOMENS PO) Take by mouth.  . sertraline (ZOLOFT) 50 MG tablet TAKE 1 TABLET BY MOUTH EVERY DAY   No facility-administered encounter medications on file as of 03/30/2021.      Medical History: Past Medical History:  Diagnosis Date  . ADD (attention deficit disorder)   . Vitamin D deficiency      Vital Signs: BP 112/78   Pulse 85   Temp (!) 97.3 F (36.3 C)   Resp 16   Ht 5' (1.524 m)   Wt 140 lb 6.4 oz (63.7 kg)   SpO2 99%   BMI 27.42 kg/m    Review of Systems  Constitutional: Negative for fatigue and fever.  HENT: Positive for congestion,  postnasal drip, rhinorrhea, sinus pressure, sinus pain and sore throat. Negative for ear pain and mouth sores.   Respiratory: Negative for cough, shortness of breath and wheezing.   Cardiovascular: Negative for chest pain.  Genitourinary: Negative for flank pain.  Neurological: Positive for headaches.  Psychiatric/Behavioral: Negative.     Physical Exam Vitals and nursing note reviewed.  Constitutional:      General: She is not in acute distress.    Appearance: She is well-developed. She is not diaphoretic.  HENT:     Head: Normocephalic and atraumatic.     Right Ear: Tympanic membrane normal.     Left Ear: Tympanic membrane normal.     Nose: Congestion present.     Mouth/Throat:     Pharynx: Posterior oropharyngeal erythema present. No oropharyngeal exudate.  Eyes:     Pupils: Pupils are equal, round, and reactive to light.  Neck:     Thyroid: No thyromegaly.     Vascular: No JVD.     Trachea: No tracheal deviation.  Cardiovascular:     Rate and Rhythm: Normal rate and regular rhythm.     Heart sounds: Normal heart sounds. No murmur heard. No friction rub. No gallop.   Pulmonary:     Effort: Pulmonary effort is normal. No respiratory distress.     Breath sounds: No wheezing or rales.  Chest:     Chest wall: No tenderness.  Abdominal:     General: Bowel sounds are normal.     Palpations: Abdomen is soft.  Musculoskeletal:        General: Normal range of motion.     Cervical back: Normal range of motion and neck supple.  Lymphadenopathy:     Cervical: No cervical adenopathy.  Skin:    General: Skin is warm and dry.  Neurological:     Mental Status: She is alert and oriented to person, place, and time.     Cranial Nerves: No cranial nerve deficit.  Psychiatric:        Behavior: Behavior normal.        Thought Content: Thought content normal.        Judgment: Judgment normal.       Assessment/Plan: 1. Acute non-recurrent maxillary sinusitis Start on Augmentin--  take twice daily with food.  May also use Sudafed congestion relief and Flonase nasal spray.  Encouraged to also resume antihistamine - amoxicillin-clavulanate (AUGMENTIN) 875-125 MG tablet; Take 1 tablet by mouth 2 (two) times daily.  Dispense: 20 tablet; Refill: 0  2. Other fatigue Patient is overdue for physical and lab work, will order routine labs to be completed before next visit - CBC w/Diff/Platelet - Comprehensive metabolic panel - TSH + free T4 - Lipid Panel With LDL/HDL Ratio - VITAMIN D 25 Hydroxy (Vit-D Deficiency, Fractures) - B12 and Folate Panel   General Counseling: Sherrol verbalizes understanding of the findings of todays visit and agrees with plan of treatment. I have discussed any further diagnostic evaluation that may be needed or ordered today. We also reviewed her medications today. she has been encouraged to call the office with any questions or concerns that should arise related to todays visit.    Counseling:    Orders Placed This Encounter  Procedures  . CBC w/Diff/Platelet  . Comprehensive metabolic panel  . TSH + free T4  . Lipid Panel With LDL/HDL Ratio  . VITAMIN D 25 Hydroxy (Vit-D Deficiency, Fractures)  . B12 and Folate Panel    Meds ordered this encounter  Medications  . amoxicillin-clavulanate (AUGMENTIN) 875-125 MG tablet    Sig: Take 1 tablet by mouth 2 (two) times daily.    Dispense:  20 tablet    Refill:  0    Time spent:30 Minutes

## 2021-04-03 ENCOUNTER — Other Ambulatory Visit: Payer: Self-pay | Admitting: Internal Medicine

## 2021-04-03 DIAGNOSIS — F411 Generalized anxiety disorder: Secondary | ICD-10-CM

## 2021-05-05 ENCOUNTER — Other Ambulatory Visit: Payer: Self-pay

## 2021-05-05 ENCOUNTER — Ambulatory Visit (INDEPENDENT_AMBULATORY_CARE_PROVIDER_SITE_OTHER): Payer: BC Managed Care – PPO

## 2021-05-05 DIAGNOSIS — R399 Unspecified symptoms and signs involving the genitourinary system: Secondary | ICD-10-CM | POA: Diagnosis not present

## 2021-05-05 LAB — POCT URINALYSIS DIPSTICK
Bilirubin, UA: NEGATIVE
Glucose, UA: NEGATIVE
Ketones, UA: NEGATIVE
Leukocytes, UA: NEGATIVE
Nitrite, UA: NEGATIVE
Protein, UA: NEGATIVE
Spec Grav, UA: 1.015 (ref 1.010–1.025)
Urobilinogen, UA: 0.2 E.U./dL
pH, UA: 6 (ref 5.0–8.0)

## 2021-05-05 NOTE — Progress Notes (Signed)
Pt present for urine drop off and UTI symptoms. Pt stated having pain with urination.

## 2021-05-07 LAB — URINE CULTURE

## 2021-05-08 ENCOUNTER — Ambulatory Visit: Payer: BC Managed Care – PPO | Admitting: Physician Assistant

## 2021-05-17 LAB — CBC WITH DIFFERENTIAL/PLATELET
Basophils Absolute: 0 10*3/uL (ref 0.0–0.2)
Basos: 0 %
EOS (ABSOLUTE): 0.2 10*3/uL (ref 0.0–0.4)
Eos: 3 %
Hematocrit: 40.8 % (ref 34.0–46.6)
Hemoglobin: 13.4 g/dL (ref 11.1–15.9)
Immature Grans (Abs): 0 10*3/uL (ref 0.0–0.1)
Immature Granulocytes: 0 %
Lymphocytes Absolute: 2.8 10*3/uL (ref 0.7–3.1)
Lymphs: 39 %
MCH: 29.5 pg (ref 26.6–33.0)
MCHC: 32.8 g/dL (ref 31.5–35.7)
MCV: 90 fL (ref 79–97)
Monocytes Absolute: 0.5 10*3/uL (ref 0.1–0.9)
Monocytes: 6 %
Neutrophils Absolute: 3.7 10*3/uL (ref 1.4–7.0)
Neutrophils: 52 %
Platelets: 310 10*3/uL (ref 150–450)
RBC: 4.54 x10E6/uL (ref 3.77–5.28)
RDW: 12 % (ref 11.7–15.4)
WBC: 7.2 10*3/uL (ref 3.4–10.8)

## 2021-05-17 LAB — COMPREHENSIVE METABOLIC PANEL
ALT: 18 IU/L (ref 0–32)
AST: 18 IU/L (ref 0–40)
Albumin/Globulin Ratio: 1.6 (ref 1.2–2.2)
Albumin: 4.5 g/dL (ref 3.9–5.0)
Alkaline Phosphatase: 73 IU/L (ref 44–121)
BUN/Creatinine Ratio: 17 (ref 9–23)
BUN: 10 mg/dL (ref 6–20)
Bilirubin Total: 0.3 mg/dL (ref 0.0–1.2)
CO2: 25 mmol/L (ref 20–29)
Calcium: 9.4 mg/dL (ref 8.7–10.2)
Chloride: 102 mmol/L (ref 96–106)
Creatinine, Ser: 0.59 mg/dL (ref 0.57–1.00)
Globulin, Total: 2.8 g/dL (ref 1.5–4.5)
Glucose: 91 mg/dL (ref 65–99)
Potassium: 4.6 mmol/L (ref 3.5–5.2)
Sodium: 139 mmol/L (ref 134–144)
Total Protein: 7.3 g/dL (ref 6.0–8.5)
eGFR: 124 mL/min/{1.73_m2} (ref >59)

## 2021-05-17 LAB — TSH+FREE T4
Free T4: 1.13 ng/dL (ref 0.82–1.77)
TSH: 1.36 u[IU]/mL (ref 0.450–4.500)

## 2021-05-17 LAB — LIPID PANEL WITH LDL/HDL RATIO
Cholesterol, Total: 158 mg/dL (ref 100–199)
HDL: 47 mg/dL (ref >39)
LDL Chol Calc (NIH): 91 mg/dL (ref 0–99)
LDL/HDL Ratio: 1.9 ratio (ref 0.0–3.2)
Triglycerides: 111 mg/dL (ref 0–149)
VLDL Cholesterol Cal: 20 mg/dL (ref 5–40)

## 2021-05-17 LAB — VITAMIN D 25 HYDROXY (VIT D DEFICIENCY, FRACTURES): Vit D, 25-Hydroxy: 44.7 ng/mL (ref 30.0–100.0)

## 2021-05-17 LAB — B12 AND FOLATE PANEL
Folate: >20 ng/mL (ref >3.0)
Vitamin B-12: 553 pg/mL (ref 232–1245)

## 2021-05-22 ENCOUNTER — Other Ambulatory Visit: Payer: Self-pay

## 2021-05-22 ENCOUNTER — Encounter: Payer: Self-pay | Admitting: Physician Assistant

## 2021-05-22 ENCOUNTER — Ambulatory Visit: Payer: BC Managed Care – PPO | Admitting: Physician Assistant

## 2021-05-22 DIAGNOSIS — E663 Overweight: Secondary | ICD-10-CM | POA: Diagnosis not present

## 2021-05-22 DIAGNOSIS — F411 Generalized anxiety disorder: Secondary | ICD-10-CM

## 2021-05-22 DIAGNOSIS — Z0001 Encounter for general adult medical examination with abnormal findings: Secondary | ICD-10-CM | POA: Diagnosis not present

## 2021-05-22 NOTE — Progress Notes (Signed)
[facility] [address] [place], [place] [address]  Internal MEDICINE  Office Visit Note  Patient Name: Tina Rodgers  [id number]  [id number]  Date of Service: 05/23/2021  Chief Complaint  Patient presents with   Follow-up    Review labs    HPI Pt is here for general health maintenance exam -Reviewed recent lab work that was all within normal limits -Patient reports that she also seen by OB/GYN who does well woman exams including breast exam, Pap and this was found to be normal -Patient would like to know if it would be okay to decrease her Zoloft. She reports her symptoms are well controlled and is interested in tapering down given she is considering pregnancy in the next year and would like to be on lower dose prior to this and can then go back up during hormonal changes associated with pregnancy if needed.  Patient does note that she has a history of increased depression during pregnancy and postpartum and will be followed closely by OB/GYN as well as our office if she does become pregnant.  Gust decreasing Zoloft by cutting in half for total of 25 mg however patient will go back up to 50 mg if she has a resurgence of her symptoms.. -She also reports that she is working on losing weight and is committed to improving her diet as well as increasing exercise. Goal to get down to 125lbs.  Current Medication: Outpatient Encounter Medications as of 05/22/2021  Medication Sig   cholecalciferol (VITAMIN D3) 25 MCG (1000 UNIT) tablet Take 1,000 Units by mouth daily.   Multiple Vitamins-Minerals (ONE-A-DAY WOMENS PO) Take by mouth.   sertraline (ZOLOFT) 50 MG tablet TAKE 1 TABLET BY MOUTH EVERY DAY   [DISCONTINUED] amoxicillin-clavulanate (AUGMENTIN) 875-125 MG tablet Take 1 tablet by mouth 2 (two) times daily. (Patient not taking: Reported on 05/22/2021)   No facility-administered encounter medications on file as of 05/22/2021.    Surgical History: Past Surgical  History:  Procedure Laterality Date   BREAST SURGERY     2 lumps removed- neg    Medical History: Past Medical History:  Diagnosis Date   ADD (attention deficit disorder)    Vitamin D deficiency     Family History: Family History  Problem Relation Age of Onset   Ovarian cancer Paternal Grandmother    Atrial fibrillation Father    Breast cancer Neg Hx    Colon cancer Neg Hx    Diabetes Neg Hx     Social History   Socioeconomic History   Marital status: Single    Spouse name: Not on file   Number of children: Not on file   Years of education: Not on file   Highest education level: Not on file  Occupational History   Not on file  Tobacco Use   Smoking status: Never   Smokeless tobacco: Never  Vaping Use   Vaping Use: Never used  Substance and Sexual Activity   Alcohol use: Yes    Comment: ocassionally   Drug use: No   Sexual activity: Yes    Birth control/protection: Condom  Other Topics Concern   Not on file  Social History Narrative   Not on file   Social Determinants of Health   Financial Resource Strain: Not on file  Food Insecurity: Not on file  Transportation Needs: Not on file  Physical Activity: Not on file  Stress: Not on file  Social Connections: Not on file  Intimate Partner Violence: Not on  file      Review of Systems  Constitutional:  Negative for chills, fatigue and unexpected weight change.  HENT:  Negative for congestion, postnasal drip, rhinorrhea, sneezing and sore throat.   Eyes:  Negative for redness.  Respiratory:  Negative for cough, chest tightness and shortness of breath.   Cardiovascular:  Negative for chest pain and palpitations.  Gastrointestinal:  Negative for abdominal pain, constipation, diarrhea, nausea and vomiting.  Genitourinary:  Negative for dysuria and frequency.  Musculoskeletal:  Negative for arthralgias, back pain, joint swelling and neck pain.  Skin:  Negative for rash.  Neurological: Negative.  Negative for  tremors and numbness.  Hematological:  Negative for adenopathy. Does not bruise/bleed easily.  Psychiatric/Behavioral:  Negative for behavioral problems (Depression), sleep disturbance and suicidal ideas. The patient is not nervous/anxious.    Vital Signs: BP 100/68   Pulse 90   Temp (!) 97.3 F (36.3 C)   Resp 16   Ht 5' (1.524 m)   Wt 140 lb 12.8 oz (63.9 kg)   SpO2 99%   BMI 27.50 kg/m    Physical Exam Vitals and nursing note reviewed.  Constitutional:      General: She is not in acute distress.    Appearance: She is well-developed. She is not diaphoretic.  HENT:     Head: Normocephalic and atraumatic.     Right Ear: External ear normal.     Left Ear: External ear normal.     Nose: Nose normal.     Mouth/Throat:     Pharynx: No oropharyngeal exudate.  Eyes:     General: No scleral icterus.       Right eye: No discharge.        Left eye: No discharge.     Conjunctiva/sclera: Conjunctivae normal.     Pupils: Pupils are equal, round, and reactive to light.  Neck:     Thyroid: No thyromegaly.     Vascular: No JVD.     Trachea: No tracheal deviation.  Cardiovascular:     Rate and Rhythm: Normal rate and regular rhythm.     Heart sounds: Normal heart sounds. No murmur heard.   No friction rub. No gallop.  Pulmonary:     Effort: Pulmonary effort is normal. No respiratory distress.     Breath sounds: Normal breath sounds. No stridor. No wheezing or rales.  Chest:     Chest wall: No tenderness.  Abdominal:     General: Bowel sounds are normal. There is no distension.     Palpations: Abdomen is soft. There is no mass.     Tenderness: There is no abdominal tenderness. There is no guarding or rebound.  Musculoskeletal:        General: No tenderness or deformity. Normal range of motion.     Cervical back: Normal range of motion and neck supple.  Lymphadenopathy:     Cervical: No cervical adenopathy.  Skin:    General: Skin is warm and dry.     Coloration: Skin is not  pale.     Findings: No erythema or rash.  Neurological:     Mental Status: She is alert.     Cranial Nerves: No cranial nerve deficit.     Motor: No abnormal muscle tone.     Coordination: Coordination normal.     Deep Tendon Reflexes: Reflexes are normal and symmetric.  Psychiatric:        Behavior: Behavior normal.        Thought Content:  Thought content normal.        Judgment: Judgment normal.       Assessment/Plan: 1. Encounter for general adult medical examination with abnormal findings Reviewed recent labs, patient up-to-date on Pap with negative HPV done via OB/GYN-recommend repeat in 5 years.  Breast exam also done via OB/GYN therefore not done in office today.  2. Generalized anxiety disorder May cut Zoloft dose in half down to 25 mg.  Patient educated to call or go back to 50 mg if recurrence of symptoms.  3. Overweight (BMI 25.0-29.9) Discussed ways to improve diet including restricting calories using apps such as myFitnesPal and decreasing proportions as well as increasing exercise and overall activity level.  Patient has set a goal weight of 125 pounds which would put her back into a healthy weight range.   General Counseling: betheny suchecki understanding of the findings of todays visit and agrees with plan of treatment. I have discussed any further diagnostic evaluation that may be needed or ordered today. We also reviewed her medications today. she has been encouraged to call the office with any questions or concerns that should arise related to todays visit.    No orders of the defined types were placed in this encounter.   No orders of the defined types were placed in this encounter.   This patient was seen by Lynn Ito, PA-C in collaboration with Dr. Beverely Risen as a part of collaborative care agreement.   Total time spent:40 Minutes Time spent includes review of chart, medications, test results, and follow up plan with the patient.      Dr  Lyndon Code Internal medicine

## 2021-05-23 NOTE — Patient Instructions (Signed)

## 2021-05-26 ENCOUNTER — Other Ambulatory Visit: Payer: Self-pay

## 2021-05-26 ENCOUNTER — Encounter: Payer: Self-pay | Admitting: Physician Assistant

## 2021-05-26 ENCOUNTER — Ambulatory Visit: Payer: BC Managed Care – PPO | Admitting: Physician Assistant

## 2021-05-26 DIAGNOSIS — H9202 Otalgia, left ear: Secondary | ICD-10-CM

## 2021-05-26 DIAGNOSIS — R599 Enlarged lymph nodes, unspecified: Secondary | ICD-10-CM

## 2021-05-26 MED ORDER — LEVOFLOXACIN 500 MG PO TABS: 500.0000 mg | ORAL_TABLET | Freq: Every day | ORAL | 0 refills | Status: DC

## 2021-05-26 NOTE — Progress Notes (Signed)
[facility] [address] [place], [place] [address]  Internal MEDICINE  Office Visit Note  Patient Name: Tina Rodgers  [id number]  [id number]  Date of Service: 05/26/2021  Chief Complaint  Patient presents with   Acute Visit    Left side swollen lymph node      HPI Pt is here for a sick visit. -She has a lump under left ear with ear ache. These started at the same time about 2 days ago. Have not changed or gotten worse since they started. No sore throat or sinus drainage noted. Does mention she had a cold a few weeks ago, but has otherwise felt fine.  -Just had labwork 1-2 weeks ago that was completely normal. Expect this is an acute ear infection with reactive lymph node.  Current Medication:  Outpatient Encounter Medications as of 05/26/2021  Medication Sig   cholecalciferol (VITAMIN D3) 25 MCG (1000 UNIT) tablet Take 1,000 Units by mouth daily.   levofloxacin (LEVAQUIN) 500 MG tablet Take 1 tablet (500 mg total) by mouth daily.   Multiple Vitamins-Minerals (ONE-A-DAY WOMENS PO) Take by mouth.   sertraline (ZOLOFT) 50 MG tablet TAKE 1 TABLET BY MOUTH EVERY DAY   No facility-administered encounter medications on file as of 05/26/2021.      Medical History: Past Medical History:  Diagnosis Date   ADD (attention deficit disorder)    Vitamin D deficiency      Vital Signs: BP 100/84   Pulse 84   Temp 98.4 F (36.9 C)   Resp 16   Ht 5' (1.524 m)   Wt 140 lb 6.4 oz (63.7 kg)   SpO2 98%   BMI 27.42 kg/m    Review of Systems  Constitutional:  Negative for fatigue and fever.  HENT:  Positive for ear pain. Negative for congestion, mouth sores, postnasal drip, rhinorrhea, sinus pain, sore throat, trouble swallowing and voice change.        Left ear pain x2days  Respiratory:  Negative for cough.   Cardiovascular:  Negative for chest pain.  Genitourinary:  Negative for flank pain.  Hematological:  Positive for adenopathy.       Single  swollen lymph node below left ear  Psychiatric/Behavioral: Negative.     Physical Exam Vitals and nursing note reviewed.  Constitutional:      General: She is not in acute distress.    Appearance: She is well-developed. She is not diaphoretic.  HENT:     Head: Normocephalic and atraumatic.     Right Ear: Tympanic membrane and external ear normal.     Left Ear: External ear normal. There is impacted cerumen.     Ears:     Comments: Left TM not visualized due to impacted cerumen however irrigation held due to acute pain    Nose: No congestion.     Mouth/Throat:     Pharynx: No oropharyngeal exudate.  Eyes:     Pupils: Pupils are equal, round, and reactive to light.  Neck:     Thyroid: No thyromegaly.     Vascular: No JVD.     Trachea: No tracheal deviation.     Comments: Single enlarged lymph node below left ear, no other adenopathy Cardiovascular:     Rate and Rhythm: Normal rate and regular rhythm.     Heart sounds: Normal heart sounds. No murmur heard.   No friction rub. No gallop.  Pulmonary:     Effort: Pulmonary effort is normal. No respiratory distress.  Breath sounds: No wheezing or rales.  Chest:     Chest wall: No tenderness.  Abdominal:     General: Bowel sounds are normal.     Palpations: Abdomen is soft.  Musculoskeletal:        General: Normal range of motion.     Cervical back: Normal range of motion and neck supple.  Skin:    General: Skin is warm and dry.  Neurological:     Mental Status: She is alert and oriented to person, place, and time.     Cranial Nerves: No cranial nerve deficit.  Psychiatric:        Behavior: Behavior normal.        Thought Content: Thought content normal.        Judgment: Judgment normal.      Assessment/Plan: 1. Left ear pain Unable to visualize TM due to impacted cerumen, irrigation held due to acute pain and inability to visualize whether TM was still intact.  We will go ahead and start on Levaquin for suspicion of  infection.  Patient will follow-up if worsening or not improving after use of antibiotic - levofloxacin (LEVAQUIN) 500 MG tablet; Take 1 tablet (500 mg total) by mouth daily.  Dispense: 10 tablet; Refill: 0  2. Swollen lymph nodes Single swollen lymph node below the left ear likely reactive secondary to ear infection.  Will start on Levaquin and patient will follow-up if worsening or not improving after use of antibiotic.  Patient will also inform office if any new symptoms develop - levofloxacin (LEVAQUIN) 500 MG tablet; Take 1 tablet (500 mg total) by mouth daily.  Dispense: 10 tablet; Refill: 0   General Counseling: kamry faraci understanding of the findings of todays visit and agrees with plan of treatment. I have discussed any further diagnostic evaluation that may be needed or ordered today. We also reviewed her medications today. she has been encouraged to call the office with any questions or concerns that should arise related to todays visit.    Counseling:    No orders of the defined types were placed in this encounter.   Meds ordered this encounter  Medications   levofloxacin (LEVAQUIN) 500 MG tablet    Sig: Take 1 tablet (500 mg total) by mouth daily.    Dispense:  10 tablet    Refill:  0    Time spent:30 Minutes

## 2021-08-08 ENCOUNTER — Telehealth (INDEPENDENT_AMBULATORY_CARE_PROVIDER_SITE_OTHER): Payer: BC Managed Care – PPO | Admitting: Internal Medicine

## 2021-08-08 ENCOUNTER — Encounter: Payer: Self-pay | Admitting: Internal Medicine

## 2021-08-08 VITALS — Temp 99.3°F | Ht 60.0 in | Wt 140.0 lb

## 2021-08-08 DIAGNOSIS — U071 COVID-19: Secondary | ICD-10-CM

## 2021-08-08 DIAGNOSIS — J208 Acute bronchitis due to other specified organisms: Secondary | ICD-10-CM

## 2021-08-08 MED ORDER — PREDNISONE 10 MG PO TABS: ORAL_TABLET | ORAL | 0 refills | Status: DC

## 2021-08-08 MED ORDER — AZITHROMYCIN 250 MG PO TABS: ORAL_TABLET | ORAL | 0 refills | Status: DC

## 2021-08-08 NOTE — Progress Notes (Signed)
[facility] [address] [place], [place] [address]  Internal MEDICINE  Telephone Visit  Patient Name: Tina Rodgers  [id number]  [id number]  Date of Service: 08/08/2021  I connected with the patient at 2:08 pm  by telephone and verified the patients identity using two identifiers.   I discussed the limitations, risks, security and privacy concerns of performing an evaluation and management service by telephone and the availability of in person appointments. I also discussed with the patient that there may be a patient responsible charge related to the service.  The patient expressed understanding and agrees to proceed.    Chief Complaint  Patient presents with   Telephone Assessment    [id number]   Telephone Screen    2 home covid test is positive    Cough    Start Sunday    Sore Throat   Sinusitis    HPI Pt is connected for acute and sick visit - Has cold like symptoms, tested positive - Has been vaccinated for COVID   - Nasal congestion and cough, post nasal drip    Current Medication: Outpatient Encounter Medications as of 08/08/2021  Medication Sig   azithromycin (ZITHROMAX) 250 MG tablet Take one tab a day for 10 days for uri   cholecalciferol (VITAMIN D3) 25 MCG (1000 UNIT) tablet Take 1,000 Units by mouth daily.   Multiple Vitamins-Minerals (ONE-A-DAY WOMENS PO) Take by mouth.   predniSONE (DELTASONE) 10 MG tablet Take one tab 3 x day for 3 days, then take one tab 2 x a day for 3 days and then take one tab a day for 3 days for copd   sertraline (ZOLOFT) 50 MG tablet TAKE 1 TABLET BY MOUTH EVERY DAY   [DISCONTINUED] levofloxacin (LEVAQUIN) 500 MG tablet Take 1 tablet (500 mg total) by mouth daily.   No facility-administered encounter medications on file as of 08/08/2021.    Surgical History: Past Surgical History:  Procedure Laterality Date   BREAST SURGERY     2 lumps removed- neg    Medical History: Past Medical History:  Diagnosis Date    ADD (attention deficit disorder)    Vitamin D deficiency     Family History: Family History  Problem Relation Age of Onset   Ovarian cancer Paternal Grandmother    Atrial fibrillation Father    Breast cancer Neg Hx    Colon cancer Neg Hx    Diabetes Neg Hx     Social History   Socioeconomic History   Marital status: Single    Spouse name: Not on file   Number of children: Not on file   Years of education: Not on file   Highest education level: Not on file  Occupational History   Not on file  Tobacco Use   Smoking status: Never   Smokeless tobacco: Never  Vaping Use   Vaping Use: Never used  Substance and Sexual Activity   Alcohol use: Yes    Comment: ocassionally   Drug use: No   Sexual activity: Yes    Birth control/protection: Condom  Other Topics Concern   Not on file  Social History Narrative   Not on file   Social Determinants of Health   Financial Resource Strain: Not on file  Food Insecurity: Not on file  Transportation Needs: Not on file  Physical Activity: Not on file  Stress: Not on file  Social Connections: Not on file  Intimate Partner Violence: Not on file  Review of Systems  Constitutional:  Negative for fatigue and fever.  HENT:  Positive for congestion, sinus pressure and voice change. Negative for mouth sores and postnasal drip.   Respiratory:  Negative for cough.   Cardiovascular:  Negative for chest pain.  Genitourinary:  Negative for flank pain.  Psychiatric/Behavioral: Negative.     Vital Signs: Temp 99.3 F (37.4 C)   Ht 5' (1.524 m)   Wt 140 lb (63.5 kg)   BMI 27.34 kg/m    Observation/Objective: Looks comfortable. Sinus congestion    Assessment/Plan: 1. Acute bronchitis due to COVID-19 virus Pt declined antiviral therapy, will treat as prescribed  Pt is instructed to rest and increase po fluids  - azithromycin (ZITHROMAX) 250 MG tablet; Take one tab a day for 10 days for uri  Dispense: 10 tablet; Refill:  0 - predniSONE (DELTASONE) 10 MG tablet; Take one tab 3 x day for 3 days, then take one tab 2 x a day for 3 days and then take one tab a day for 3 days for copd  Dispense: 18 tablet; Refill: 0   General Counseling: ieasha boerema understanding of the findings of today's phone visit and agrees with plan of treatment. I have discussed any further diagnostic evaluation that may be needed or ordered today. We also reviewed her medications today. she has been encouraged to call the office with any questions or concerns that should arise related to todays visit.   Meds ordered this encounter  Medications   azithromycin (ZITHROMAX) 250 MG tablet    Sig: Take one tab a day for 10 days for uri    Dispense:  10 tablet    Refill:  0   predniSONE (DELTASONE) 10 MG tablet    Sig: Take one tab 3 x day for 3 days, then take one tab 2 x a day for 3 days and then take one tab a day for 3 days for copd    Dispense:  18 tablet    Refill:  0    Time spent:10 Minutes    Dr Lyndon Code Internal medicine

## 2021-08-14 ENCOUNTER — Encounter: Payer: Self-pay | Admitting: Internal Medicine

## 2021-08-15 ENCOUNTER — Telehealth: Payer: Self-pay

## 2021-08-15 NOTE — Telephone Encounter (Signed)
Spoke with pt advised her to take claritin OTC and take cough syrup at night and also finish antibiotic and prednisone also gargle with salt water  and call us back after finished antibiotic if not feeling better

## 2021-09-26 ENCOUNTER — Ambulatory Visit (INDEPENDENT_AMBULATORY_CARE_PROVIDER_SITE_OTHER): Payer: BC Managed Care – PPO | Admitting: Internal Medicine

## 2021-09-26 ENCOUNTER — Encounter: Payer: Self-pay | Admitting: Nurse Practitioner

## 2021-09-26 ENCOUNTER — Other Ambulatory Visit: Payer: Self-pay

## 2021-09-26 VITALS — BP 112/78 | HR 82 | Temp 98.0°F | Resp 16 | Ht 60.0 in | Wt 145.0 lb

## 2021-09-26 DIAGNOSIS — R21 Rash and other nonspecific skin eruption: Secondary | ICD-10-CM

## 2021-09-26 MED ORDER — BETAMETHASONE DIPROPIONATE AUG 0.05 % EX OINT: TOPICAL_OINTMENT | Freq: Two times a day (BID) | CUTANEOUS | 0 refills | Status: DC

## 2021-09-26 MED ORDER — CEPHALEXIN 500 MG PO CAPS: ORAL_CAPSULE | ORAL | 0 refills | Status: DC

## 2021-09-26 NOTE — Progress Notes (Signed)
[facility] [address] [place], [place] [address]  Internal MEDICINE  Office Visit Note  Patient Name: Tina Rodgers  [id number]  [id number]  Date of Service: 10/12/2021  Chief Complaint  Patient presents with   Acute Visit   Insect Bite    Above right elbow     Cough    HPI  Pt is here for acute visit C/O insect bite, now has swelling and redness. Denies any fever or chills    Current Medication: Outpatient Encounter Medications as of 09/26/2021  Medication Sig   augmented betamethasone dipropionate (DIPROLENE) 0.05 % ointment Apply topically 2 (two) times daily.   cholecalciferol (VITAMIN D3) 25 MCG (1000 UNIT) tablet Take 1,000 Units by mouth daily.   Multiple Vitamins-Minerals (ONE-A-DAY WOMENS PO) Take by mouth.   sertraline (ZOLOFT) 50 MG tablet TAKE 1 TABLET BY MOUTH EVERY DAY   [DISCONTINUED] azithromycin (ZITHROMAX) 250 MG tablet Take one tab a day for 10 days for uri   [DISCONTINUED] cephALEXin (KEFLEX) 500 MG capsule Take one tab 3 x days   [DISCONTINUED] predniSONE (DELTASONE) 10 MG tablet Take one tab 3 x day for 3 days, then take one tab 2 x a day for 3 days and then take one tab a day for 3 days for copd   No facility-administered encounter medications on file as of 09/26/2021.    Surgical History: Past Surgical History:  Procedure Laterality Date   BREAST SURGERY     2 lumps removed- neg    Medical History: Past Medical History:  Diagnosis Date   ADD (attention deficit disorder)    Vitamin D deficiency     Family History: Family History  Problem Relation Age of Onset   Ovarian cancer Paternal Grandmother    Atrial fibrillation Father    Breast cancer Neg Hx    Colon cancer Neg Hx    Diabetes Neg Hx     Social History   Socioeconomic History   Marital status: Single    Spouse name: Not on file   Number of children: Not on file   Years of education: Not on file   Highest education level: Not on file   Occupational History   Not on file  Tobacco Use   Smoking status: Never   Smokeless tobacco: Never  Vaping Use   Vaping Use: Never used  Substance and Sexual Activity   Alcohol use: Yes    Comment: ocassionally   Drug use: No   Sexual activity: Yes    Birth control/protection: Condom  Other Topics Concern   Not on file  Social History Narrative   Not on file   Social Determinants of Health   Financial Resource Strain: Not on file  Food Insecurity: Not on file  Transportation Needs: Not on file  Physical Activity: Not on file  Stress: Not on file  Social Connections: Not on file  Intimate Partner Violence: Not on file      Review of Systems  Constitutional:  Negative for fatigue and fever.  HENT:  Negative for congestion, mouth sores and postnasal drip.   Respiratory:  Negative for cough.   Cardiovascular:  Negative for chest pain.  Genitourinary:  Negative for flank pain.  Skin:  Positive for rash. Negative for pallor.  Psychiatric/Behavioral: Negative.     Vital Signs: BP 112/78   Pulse 82   Temp 98 F (36.7 C)   Resp 16   Ht 5' (1.524 m)   Wt 145 lb (65.8  kg)   SpO2 99%   BMI 28.32 kg/m    Physical Exam Cardiovascular:     Rate and Rhythm: Normal rate.  Skin:    General: Skin is warm.     Findings: Erythema present.       Assessment/Plan: 1. Rash in adult Start keflex as prescribed today  - augmented betamethasone dipropionate (DIPROLENE) 0.05 % ointment; Apply topically 2 (two) times daily.  Dispense: 15 g; Refill: 0   General Counseling: Tina Rodgers verbalizes understanding of the findings of todays visit and agrees with plan of treatment. I have discussed any further diagnostic evaluation that may be needed or ordered today. We also reviewed her medications today. she has been encouraged to call the office with any questions or concerns that should arise related to todays visit.    No orders of the defined types were placed in this  encounter.   Meds ordered this encounter  Medications   DISCONTD: cephALEXin (KEFLEX) 500 MG capsule    Sig: Take one tab 3 x days    Dispense:  15 capsule    Refill:  0   augmented betamethasone dipropionate (DIPROLENE) 0.05 % ointment    Sig: Apply topically 2 (two) times daily.    Dispense:  15 g    Refill:  0    Total time spent:20 Minutes Time spent includes review of chart, medications, test results, and follow up plan with the patient.   [place] Controlled Substance Database was reviewed by me.   Dr Lyndon Code Internal medicine

## 2021-10-11 ENCOUNTER — Telehealth: Payer: Self-pay

## 2021-10-11 ENCOUNTER — Other Ambulatory Visit: Payer: Self-pay

## 2021-10-11 MED ORDER — CEPHALEXIN 500 MG PO CAPS: ORAL_CAPSULE | ORAL | 0 refills | Status: AC

## 2021-10-11 NOTE — Telephone Encounter (Signed)
Pt called  that she had again bug bite on same arm but different place and its red and puff no infection I send antibiotic for 5 days and advised also used topical ointment given  at last visit

## 2021-11-06 ENCOUNTER — Encounter: Payer: Self-pay | Admitting: Nurse Practitioner

## 2021-11-06 ENCOUNTER — Other Ambulatory Visit: Payer: Self-pay

## 2021-11-06 ENCOUNTER — Telehealth (INDEPENDENT_AMBULATORY_CARE_PROVIDER_SITE_OTHER): Payer: BC Managed Care – PPO | Admitting: Nurse Practitioner

## 2021-11-06 VITALS — Resp 16 | Ht 60.0 in | Wt 145.0 lb

## 2021-11-06 DIAGNOSIS — R0981 Nasal congestion: Secondary | ICD-10-CM

## 2021-11-06 DIAGNOSIS — J018 Other acute sinusitis: Secondary | ICD-10-CM

## 2021-11-06 MED ORDER — PROMETHAZINE-PHENYLEPHRINE 6.25-5 MG/5ML PO SYRP: 5.0000 mL | ORAL_SOLUTION | ORAL | 0 refills | Status: DC | PRN

## 2021-11-06 MED ORDER — DOXYCYCLINE HYCLATE 100 MG PO TABS: 100.0000 mg | ORAL_TABLET | Freq: Two times a day (BID) | ORAL | 0 refills | Status: DC

## 2021-11-06 NOTE — Progress Notes (Signed)
[facility] [address] [place], [place] [address]  Internal MEDICINE  Telephone Visit  Patient Name: Tina Rodgers  [id number]  [id number]  Date of Service: 11/06/2021  I connected with the patient at 3:25 PM by telephone and verified the patients identity using two identifiers.   I discussed the limitations, risks, security and privacy concerns of performing an evaluation and management service by telephone and the availability of in person appointments. I also discussed with the patient that there may be a patient responsible charge related to the service.  The patient expressed understanding and agrees to proceed.    Chief Complaint  Patient presents with   Acute Visit    Neg. Covid test, chest congestion, a lot of mucus that feels like it wont come up    Telephone Assessment    [phone]    Telephone Screen    Video or phone   Cough   Sore Throat    Worse last week    HPI Sehaj presents for a telehealth virtual visit for cough and sore throat x2 week. She is negative for covid. She has chest congestion and a lot of mucous that she cannot expectorate.    Current Medication: Outpatient Encounter Medications as of 11/06/2021  Medication Sig   augmented betamethasone dipropionate (DIPROLENE) 0.05 % ointment Apply topically 2 (two) times daily.   cholecalciferol (VITAMIN D3) 25 MCG (1000 UNIT) tablet Take 1,000 Units by mouth daily.   doxycycline (VIBRA-TABS) 100 MG tablet Take 1 tablet (100 mg total) by mouth 2 (two) times daily. With food (Patient not taking: Reported on 11/23/2021)   Multiple Vitamins-Minerals (ONE-A-DAY WOMENS PO) Take by mouth.   promethazine-phenylephrine 6.25-5 MG/5ML SYRP Take 5 mLs by mouth every 4 (four) hours as needed for congestion. (Patient not taking: Reported on 11/23/2021)   [DISCONTINUED] sertraline (ZOLOFT) 50 MG tablet TAKE 1 TABLET BY MOUTH EVERY DAY   No facility-administered encounter medications on file as of  11/06/2021.    Surgical History: Past Surgical History:  Procedure Laterality Date   BREAST SURGERY     2 lumps removed- neg    Medical History: Past Medical History:  Diagnosis Date   ADD (attention deficit disorder)    Vitamin D deficiency     Family History: Family History  Problem Relation Age of Onset   Ovarian cancer Paternal Grandmother    Atrial fibrillation Father    Breast cancer Neg Hx    Colon cancer Neg Hx    Diabetes Neg Hx     Social History   Socioeconomic History   Marital status: Single    Spouse name: Not on file   Number of children: Not on file   Years of education: Not on file   Highest education level: Not on file  Occupational History   Not on file  Tobacco Use   Smoking status: Never   Smokeless tobacco: Never  Vaping Use   Vaping Use: Never used  Substance and Sexual Activity   Alcohol use: Yes    Comment: ocassionally   Drug use: No   Sexual activity: Yes    Birth control/protection: Condom  Other Topics Concern   Not on file  Social History Narrative   Not on file   Social Determinants of Health   Financial Resource Strain: Not on file  Food Insecurity: Not on file  Transportation Needs: Not on file  Physical Activity: Not on file  Stress: Not on file  Social Connections: Not on  file  Intimate Partner Violence: Not on file      Review of Systems  Constitutional:  Negative for chills, fatigue and fever.  HENT:  Positive for congestion, postnasal drip, rhinorrhea, sinus pressure, sinus pain and sore throat. Negative for trouble swallowing.   Respiratory:  Positive for cough and chest tightness. Negative for shortness of breath and wheezing.   Cardiovascular: Negative.  Negative for chest pain and palpitations.  Gastrointestinal:  Negative for abdominal pain, constipation, diarrhea, nausea and vomiting.  Musculoskeletal:  Negative for myalgias.   Vital Signs: Resp 16   Ht 5' (1.524 m)   Wt 145 lb (65.8 kg)   BMI  28.32 kg/m    Observation/Objective: She is alert and oriented and engages in conversation appropriately.  She does not appear to be in any acute distress over video call.    Assessment/Plan: 1. Acute non-recurrent sinusitis of other sinus Empiric antibiotic treatment prescribed - doxycycline (VIBRA-TABS) 100 MG tablet; Take 1 tablet (100 mg total) by mouth 2 (two) times daily. With food (Patient not taking: Reported on 11/23/2021)  Dispense: 14 tablet; Refill: 0  2. Nasal congestion Medication to thin out the mucus and alleviate congestion is prescribed. - promethazine-phenylephrine 6.25-5 MG/5ML SYRP; Take 5 mLs by mouth every 4 (four) hours as needed for congestion. (Patient not taking: Reported on 11/23/2021)  Dispense: 180 mL; Refill: 0   General Counseling: idamay hosein understanding of the findings of today's phone visit and agrees with plan of treatment. I have discussed any further diagnostic evaluation that may be needed or ordered today. We also reviewed her medications today. she has been encouraged to call the office with any questions or concerns that should arise related to todays visit.  Return if symptoms worsen or fail to improve.   No orders of the defined types were placed in this encounter.   Meds ordered this encounter  Medications   doxycycline (VIBRA-TABS) 100 MG tablet    Sig: Take 1 tablet (100 mg total) by mouth 2 (two) times daily. With food    Dispense:  14 tablet    Refill:  0   promethazine-phenylephrine 6.25-5 MG/5ML SYRP    Sig: Take 5 mLs by mouth every 4 (four) hours as needed for congestion.    Dispense:  180 mL    Refill:  0    Time spent:10 Minutes Time spent with patient included reviewing progress notes, labs, imaging studies, and discussing plan for follow up.  [place] Controlled Substance Database was reviewed by me for overdose risk score (ORS) if appropriate.  This patient was seen by Sallyanne Kuster, FNP-C in collaboration with  Dr. Beverely Risen as a part of collaborative care agreement.  Danner Paulding R. Tedd Sias, MSN, FNP-C Internal medicine

## 2021-11-23 ENCOUNTER — Other Ambulatory Visit: Payer: Self-pay

## 2021-11-23 ENCOUNTER — Ambulatory Visit (INDEPENDENT_AMBULATORY_CARE_PROVIDER_SITE_OTHER): Payer: BC Managed Care – PPO | Admitting: Physician Assistant

## 2021-11-23 ENCOUNTER — Encounter: Payer: Self-pay | Admitting: Physician Assistant

## 2021-11-23 DIAGNOSIS — L2082 Flexural eczema: Secondary | ICD-10-CM | POA: Diagnosis not present

## 2021-11-23 DIAGNOSIS — F411 Generalized anxiety disorder: Secondary | ICD-10-CM

## 2021-11-23 DIAGNOSIS — J302 Other seasonal allergic rhinitis: Secondary | ICD-10-CM

## 2021-11-23 MED ORDER — SERTRALINE HCL 50 MG PO TABS: 50.0000 mg | ORAL_TABLET | Freq: Every day | ORAL | 2 refills | Status: DC

## 2021-11-23 NOTE — Progress Notes (Signed)
[facility] [address] [place], [place] [address]  Internal MEDICINE  Office Visit Note  Patient Name: Tina Rodgers  [id number]  [id number]  Date of Service: 11/23/2021  Chief Complaint  Patient presents with   Follow-up   Leg Problem    Has itchy, scaly rash on right leg; started 1-2 months ago    HPI Pt is here for routine follow up -She has been seen for several acute visits in the last 6 months due to sinus infection as well as bug bites leading to rash/cellulitis concerns. -She now thinks she had bed bugs that caused the bites and rash as she stayed at a family member's home and they had a neighbor with bed begs. She states she has not had any more problems with this, but does still have the steroid cream if needed.  -She does have a different rash on the flexural fold behind right knee that comes and goes for the last month or so. She has tried hydrocortisone and it helps take the itch away and usually resolves the problem for a little while. -Discussed that this is likely eczema and to keep the area well moisturized and avoid scratching. May continue to use hydrocortisone cream as needed. -She does also have allergies that flare and lead to some mornings with postnasal drip and sore throat that then resolves throughout the day. She has been taking claritin but may consider switching to alternative antihistamine and restart nasal spray. Advised to try flonase if congested, but may want to use regular saline spray if nose feeling dried out. -Off work until the first of the year  Current Medication: Outpatient Encounter Medications as of 11/23/2021  Medication Sig   augmented betamethasone dipropionate (DIPROLENE) 0.05 % ointment Apply topically 2 (two) times daily.   cholecalciferol (VITAMIN D3) 25 MCG (1000 UNIT) tablet Take 1,000 Units by mouth daily.   Multiple Vitamins-Minerals (ONE-A-DAY WOMENS PO) Take by mouth.   [DISCONTINUED] sertraline (ZOLOFT)  50 MG tablet TAKE 1 TABLET BY MOUTH EVERY DAY   doxycycline (VIBRA-TABS) 100 MG tablet Take 1 tablet (100 mg total) by mouth 2 (two) times daily. With food (Patient not taking: Reported on 11/23/2021)   promethazine-phenylephrine 6.25-5 MG/5ML SYRP Take 5 mLs by mouth every 4 (four) hours as needed for congestion. (Patient not taking: Reported on 11/23/2021)   sertraline (ZOLOFT) 50 MG tablet Take 1 tablet (50 mg total) by mouth daily.   No facility-administered encounter medications on file as of 11/23/2021.    Surgical History: Past Surgical History:  Procedure Laterality Date   BREAST SURGERY     2 lumps removed- neg    Medical History: Past Medical History:  Diagnosis Date   ADD (attention deficit disorder)    Vitamin D deficiency     Family History: Family History  Problem Relation Age of Onset   Ovarian cancer Paternal Grandmother    Atrial fibrillation Father    Breast cancer Neg Hx    Colon cancer Neg Hx    Diabetes Neg Hx     Social History   Socioeconomic History   Marital status: Single    Spouse name: Not on file   Number of children: Not on file   Years of education: Not on file   Highest education level: Not on file  Occupational History   Not on file  Tobacco Use   Smoking status: Never   Smokeless tobacco: Never  Vaping Use   Vaping Use: Never used  Substance and Sexual Activity   Alcohol use: Yes    Comment: ocassionally   Drug use: No   Sexual activity: Yes    Birth control/protection: Condom  Other Topics Concern   Not on file  Social History Narrative   Not on file   Social Determinants of Health   Financial Resource Strain: Not on file  Food Insecurity: Not on file  Transportation Needs: Not on file  Physical Activity: Not on file  Stress: Not on file  Social Connections: Not on file  Intimate Partner Violence: Not on file      Review of Systems  Constitutional:  Negative for chills, fatigue and unexpected weight change.   HENT:  Positive for postnasal drip. Negative for congestion, rhinorrhea, sneezing and sore throat.   Eyes:  Negative for redness.  Respiratory:  Negative for cough, chest tightness and shortness of breath.   Cardiovascular:  Negative for chest pain and palpitations.  Gastrointestinal:  Negative for abdominal pain, constipation, diarrhea, nausea and vomiting.  Genitourinary:  Negative for dysuria and frequency.  Musculoskeletal:  Negative for arthralgias, back pain, joint swelling and neck pain.  Skin:  Positive for rash.       Itchy rash on back of right leg behind knee  Allergic/Immunologic: Positive for environmental allergies.  Neurological: Negative.  Negative for tremors and numbness.  Hematological:  Negative for adenopathy. Does not bruise/bleed easily.  Psychiatric/Behavioral:  Negative for behavioral problems (Depression), sleep disturbance and suicidal ideas. The patient is not nervous/anxious.    Vital Signs: BP 102/61    Pulse 83    Temp 98.4 F (36.9 C)    Resp 16    Ht 5' (1.524 m)    Wt 149 lb (67.6 kg)    SpO2 98%    BMI 29.10 kg/m    Physical Exam Vitals and nursing note reviewed.  Constitutional:      General: She is not in acute distress.    Appearance: She is well-developed. She is not diaphoretic.  HENT:     Head: Normocephalic and atraumatic.     Mouth/Throat:     Pharynx: No oropharyngeal exudate.  Eyes:     Pupils: Pupils are equal, round, and reactive to light.  Neck:     Thyroid: No thyromegaly.     Vascular: No JVD.     Trachea: No tracheal deviation.  Cardiovascular:     Rate and Rhythm: Normal rate and regular rhythm.     Heart sounds: Normal heart sounds. No murmur heard.   No friction rub. No gallop.  Pulmonary:     Effort: Pulmonary effort is normal. No respiratory distress.     Breath sounds: No wheezing or rales.  Chest:     Chest wall: No tenderness.  Abdominal:     General: Bowel sounds are normal.     Palpations: Abdomen is soft.   Musculoskeletal:        General: Normal range of motion.     Cervical back: Normal range of motion and neck supple.  Lymphadenopathy:     Cervical: No cervical adenopathy.  Skin:    General: Skin is warm and dry.     Findings: Rash present.     Comments: Rash on flexural surface behind right knee consistent with mild eczema  Neurological:     Mental Status: She is alert and oriented to person, place, and time.     Cranial Nerves: No cranial nerve deficit.  Psychiatric:  Behavior: Behavior normal.        Thought Content: Thought content normal.        Judgment: Judgment normal.       Assessment/Plan: 1. Generalized anxiety disorder Stable, may continue zoloft - sertraline (ZOLOFT) 50 MG tablet; Take 1 tablet (50 mg total) by mouth daily.  Dispense: 90 tablet; Refill: 2  2. Flexural eczema Keep area moisturized to prevent dryness, avoid itching, and may use hydrocortisone cream as needed for flares  3. Seasonal allergic rhinitis, unspecified trigger Try switching to zyrtec or allegra and continue nasal sprays   General Counseling: kameelah minish understanding of the findings of todays visit and agrees with plan of treatment. I have discussed any further diagnostic evaluation that may be needed or ordered today. We also reviewed her medications today. she has been encouraged to call the office with any questions or concerns that should arise related to todays visit.    No orders of the defined types were placed in this encounter.   Meds ordered this encounter  Medications   sertraline (ZOLOFT) 50 MG tablet    Sig: Take 1 tablet (50 mg total) by mouth daily.    Dispense:  90 tablet    Refill:  2    This patient was seen by Lynn Ito, PA-C in collaboration with Dr. Beverely Risen as a part of collaborative care agreement.   Total time spent:30 Minutes Time spent includes review of chart, medications, test results, and follow up plan with the patient.       Dr Lyndon Code Internal medicine

## 2021-11-29 ENCOUNTER — Encounter: Payer: Self-pay | Admitting: Nurse Practitioner

## 2021-11-30 ENCOUNTER — Telehealth (INDEPENDENT_AMBULATORY_CARE_PROVIDER_SITE_OTHER): Payer: BC Managed Care – PPO | Admitting: Nurse Practitioner

## 2021-11-30 ENCOUNTER — Encounter: Payer: Self-pay | Admitting: Nurse Practitioner

## 2021-11-30 ENCOUNTER — Telehealth: Payer: Self-pay

## 2021-11-30 DIAGNOSIS — R051 Acute cough: Secondary | ICD-10-CM

## 2021-11-30 DIAGNOSIS — J0181 Other acute recurrent sinusitis: Secondary | ICD-10-CM | POA: Diagnosis not present

## 2021-11-30 MED ORDER — AMOXICILLIN-POT CLAVULANATE 875-125 MG PO TABS: 1.0000 | ORAL_TABLET | Freq: Two times a day (BID) | ORAL | 0 refills | Status: DC

## 2021-11-30 MED ORDER — BENZONATATE 100 MG PO CAPS: 100.0000 mg | ORAL_CAPSULE | Freq: Two times a day (BID) | ORAL | 0 refills | Status: DC | PRN

## 2021-11-30 NOTE — Progress Notes (Signed)
[facility] [address] [place], [place] [address]  Internal MEDICINE  Telephone Visit  Patient Name: Tina Rodgers  [id number]  [id number]  Date of Service: 11/30/2021  I connected with the patient at 3:15 PM by telephone and verified the patients identity using two identifiers.   I discussed the limitations, risks, security and privacy concerns of performing an evaluation and management service by telephone and the availability of in person appointments. I also discussed with the patient that there may be a patient responsible charge related to the service.  The patient expressed understanding and agrees to proceed.    Chief Complaint  Patient presents with   Telephone Screen   Telephone Assessment   Cough    Cough can be constant, tried mucinex, tylenol cold/flu   Nasal Congestion   Sore Throat    HPI Tina Rodgers presents for a telehealth virtual visit for cough and sore throat x2 week. She is negative for covid. She has chest congestion and a lot of mucous that she cannot expectorate. She was seen for a virtual visit on 12/5/ and was treated for sinusitis with doxycycline. She has had multiple respiratory infections in the past 12 months. Reports chest congestion.    Current Medication: Outpatient Encounter Medications as of 11/30/2021  Medication Sig   amoxicillin-clavulanate (AUGMENTIN) 875-125 MG tablet Take 1 tablet by mouth 2 (two) times daily.   augmented betamethasone dipropionate (DIPROLENE) 0.05 % ointment Apply topically 2 (two) times daily.   benzonatate (TESSALON) 100 MG capsule Take 1 capsule (100 mg total) by mouth 2 (two) times daily as needed for cough.   cholecalciferol (VITAMIN D3) 25 MCG (1000 UNIT) tablet Take 1,000 Units by mouth daily.   Multiple Vitamins-Minerals (ONE-A-DAY WOMENS PO) Take by mouth.   promethazine-phenylephrine 6.25-5 MG/5ML SYRP Take 5 mLs by mouth every 4 (four) hours as needed for congestion.   sertraline (ZOLOFT)  50 MG tablet Take 1 tablet (50 mg total) by mouth daily.   [DISCONTINUED] doxycycline (VIBRA-TABS) 100 MG tablet Take 1 tablet (100 mg total) by mouth 2 (two) times daily. With food   No facility-administered encounter medications on file as of 11/30/2021.    Surgical History: Past Surgical History:  Procedure Laterality Date   BREAST SURGERY     2 lumps removed- neg    Medical History: Past Medical History:  Diagnosis Date   ADD (attention deficit disorder)    Vitamin D deficiency     Family History: Family History  Problem Relation Age of Onset   Ovarian cancer Paternal Grandmother    Atrial fibrillation Father    Breast cancer Neg Hx    Colon cancer Neg Hx    Diabetes Neg Hx     Social History   Socioeconomic History   Marital status: Single    Spouse name: Not on file   Number of children: Not on file   Years of education: Not on file   Highest education level: Not on file  Occupational History   Not on file  Tobacco Use   Smoking status: Never   Smokeless tobacco: Never  Vaping Use   Vaping Use: Never used  Substance and Sexual Activity   Alcohol use: Yes    Comment: ocassionally   Drug use: No   Sexual activity: Yes    Birth control/protection: Condom  Other Topics Concern   Not on file  Social History Narrative   Not on file   Social Determinants of Health   Financial  Resource Strain: Not on file  Food Insecurity: Not on file  Transportation Needs: Not on file  Physical Activity: Not on file  Stress: Not on file  Social Connections: Not on file  Intimate Partner Violence: Not on file      Review of Systems  Constitutional:  Negative for chills, fatigue and fever.  HENT:  Positive for congestion, postnasal drip, rhinorrhea, sinus pain and sore throat. Negative for ear pain.   Respiratory:  Positive for cough and chest tightness. Negative for shortness of breath and wheezing.   Cardiovascular: Negative.  Negative for chest pain and  palpitations.  Gastrointestinal:  Negative for abdominal pain, constipation, diarrhea, nausea and vomiting.  Musculoskeletal:  Negative for myalgias.  Neurological:  Negative for headaches.   Vital Signs: There were no vitals taken for this visit.   Observation/Objective: She is alert and oriented and engages in conversation appropriately. She does not appear to be in any acute distress over video call.     Assessment/Plan: 1. Other acute recurrent sinusitis Empiric antibiotic treatment prescribed.  - amoxicillin-clavulanate (AUGMENTIN) 875-125 MG tablet; Take 1 tablet by mouth 2 (two) times daily.  Dispense: 20 tablet; Refill: 0 - Ambulatory referral to ENT  2. Acute cough Medication prescribed for symptomatic relief of cough - benzonatate (TESSALON) 100 MG capsule; Take 1 capsule (100 mg total) by mouth 2 (two) times daily as needed for cough.  Dispense: 30 capsule; Refill: 0   General Counseling: mirta mally understanding of the findings of today's phone visit and agrees with plan of treatment. I have discussed any further diagnostic evaluation that may be needed or ordered today. We also reviewed her medications today. she has been encouraged to call the office with any questions or concerns that should arise related to todays visit.  Return if symptoms worsen or fail to improve.   Orders Placed This Encounter  Procedures   Ambulatory referral to ENT    Meds ordered this encounter  Medications   amoxicillin-clavulanate (AUGMENTIN) 875-125 MG tablet    Sig: Take 1 tablet by mouth 2 (two) times daily.    Dispense:  20 tablet    Refill:  0   benzonatate (TESSALON) 100 MG capsule    Sig: Take 1 capsule (100 mg total) by mouth 2 (two) times daily as needed for cough.    Dispense:  30 capsule    Refill:  0    Time spent:10 Minutes Time spent with patient included reviewing progress notes, labs, imaging studies, and discussing plan for follow up.  [place] Controlled  Substance Database was reviewed by me for overdose risk score (ORS) if appropriate.  This patient was seen by Sallyanne Kuster, FNP-C in collaboration with Dr. Beverely Risen as a part of collaborative care agreement.  Tina Rodgers. Tedd Sias, MSN, FNP-C Internal medicine

## 2021-11-30 NOTE — Telephone Encounter (Signed)
Otolaryngology referral sent via [facility] to [facility]-Toni °

## 2021-12-12 NOTE — Telephone Encounter (Signed)
S/w Monse w/ [facility]. She stated they lvm on 12/07/21 to return their call to schedule appointment. I lvm this morning for patient to return my call to see if she is still interested in going to see them-Toni

## 2021-12-18 NOTE — Telephone Encounter (Signed)
S/w [facility] again. Patient still has not scheduled appointment-Tina Rodgers

## 2021-12-18 NOTE — Telephone Encounter (Signed)
[facility] appointment 01/12/22 @ 3:00-Toni

## 2022-02-19 ENCOUNTER — Other Ambulatory Visit: Payer: Self-pay

## 2022-02-19 ENCOUNTER — Encounter: Payer: Self-pay | Admitting: Physician Assistant

## 2022-02-19 ENCOUNTER — Ambulatory Visit (INDEPENDENT_AMBULATORY_CARE_PROVIDER_SITE_OTHER): Payer: BC Managed Care – PPO | Admitting: Physician Assistant

## 2022-02-19 VITALS — BP 108/72 | HR 85 | Temp 98.5°F | Resp 16 | Ht 60.0 in | Wt 147.2 lb

## 2022-02-19 DIAGNOSIS — H6123 Impacted cerumen, bilateral: Secondary | ICD-10-CM | POA: Diagnosis not present

## 2022-02-19 DIAGNOSIS — H9192 Unspecified hearing loss, left ear: Secondary | ICD-10-CM

## 2022-02-19 NOTE — Progress Notes (Signed)
?[facility] ?[address] ?[place], [place] [address] ? ?Internal MEDICINE  ?Office Visit Note ? ?Patient Name: Tina Rodgers ? [id number]  ?[id number] ? ?Date of Service: 02/25/2022 ? ?Chief Complaint  ?Patient presents with  ? Ear Problem  ?  Water in left ear, muffled, some pain, happened Thursday night  ? ? ? ?HPI ?Pt is here for a sick visit. ?-Pt has felt like she has had some water in left ear since Thursday. Tried cotton swab and got a little wax.  ?-Talked with school nurse and tried a few techniques that did not help ?-Tried drops for wax and nothing helped. Then tried pain relief drops and it made it worse. Midday the next day did not feel worse again. Tried hydrogen peroxide and didn't help. ?-Muffled hearing on the left as well ? ?Current Medication: ? ?Outpatient Encounter Medications as of 02/19/2022  ?Medication Sig  ? cholecalciferol (VITAMIN D3) 25 MCG (1000 UNIT) tablet Take 1,000 Units by mouth daily.  ? Multiple Vitamins-Minerals (ONE-A-DAY WOMENS PO) Take by mouth.  ? promethazine-phenylephrine 6.25-5 MG/5ML SYRP Take 5 mLs by mouth every 4 (four) hours as needed for congestion.  ? sertraline (ZOLOFT) 50 MG tablet Take 1 tablet (50 mg total) by mouth daily.  ? [DISCONTINUED] augmented betamethasone dipropionate (DIPROLENE) 0.05 % ointment Apply topically 2 (two) times daily.  ? [DISCONTINUED] benzonatate (TESSALON) 100 MG capsule Take 1 capsule (100 mg total) by mouth 2 (two) times daily as needed for cough.  ? [DISCONTINUED] amoxicillin-clavulanate (AUGMENTIN) 875-125 MG tablet Take 1 tablet by mouth 2 (two) times daily.  ? ?No facility-administered encounter medications on file as of 02/19/2022.  ? ? ? ? ?Medical History: ?Past Medical History:  ?Diagnosis Date  ? ADD (attention deficit disorder)   ? Vitamin D deficiency   ? ? ? ?Vital Signs: ?BP 108/72   Pulse 85   Temp 98.5 ?F (36.9 ?C)   Resp 16   Ht 5' (1.524 m)   Wt 147 lb 3.2 oz (66.8 kg)   LMP 02/01/2022 (Exact  Date)   SpO2 98%   BMI 28.75 kg/m?  ? ? ?Review of Systems  ?Constitutional:  Negative for fatigue and fever.  ?HENT:  Positive for ear pain. Negative for congestion, mouth sores and postnasal drip.   ?     Muffled hearing on left  ?Respiratory:  Negative for cough.   ?Cardiovascular:  Negative for chest pain.  ?Genitourinary:  Negative for flank pain.  ?Psychiatric/Behavioral: Negative.    ? ?Physical Exam ?Vitals and nursing note reviewed.  ?Constitutional:   ?   General: She is not in acute distress. ?   Appearance: She is well-developed. She is not diaphoretic.  ?HENT:  ?   Head: Normocephalic and atraumatic.  ?   Right Ear: External ear normal.  ?   Left Ear: External ear normal. There is impacted cerumen.  ?   Mouth/Throat:  ?   Pharynx: No oropharyngeal exudate.  ?Eyes:  ?   Pupils: Pupils are equal, round, and reactive to light.  ?Neck:  ?   Thyroid: No thyromegaly.  ?   Vascular: No JVD.  ?   Trachea: No tracheal deviation.  ?Cardiovascular:  ?   Rate and Rhythm: Normal rate and regular rhythm.  ?   Heart sounds: Normal heart sounds. No murmur heard. ?  No friction rub. No gallop.  ?Pulmonary:  ?   Effort: Pulmonary effort is normal. No respiratory distress.  ?  Breath sounds: No wheezing or rales.  ?Chest:  ?   Chest wall: No tenderness.  ?Abdominal:  ?   General: Bowel sounds are normal.  ?   Palpations: Abdomen is soft.  ?Musculoskeletal:     ?   General: Normal range of motion.  ?   Cervical back: Normal range of motion and neck supple.  ?Lymphadenopathy:  ?   Cervical: No cervical adenopathy.  ?Skin: ?   General: Skin is warm and dry.  ?Neurological:  ?   Mental Status: She is alert and oriented to person, place, and time.  ?   Cranial Nerves: No cranial nerve deficit.  ?Psychiatric:     ?   Behavior: Behavior normal.     ?   Thought Content: Thought content normal.     ?   Judgment: Judgment normal.  ? ? ? ? ?Assessment/Plan: ?1. Impacted cerumen of both ears ?Large amount of wax in left ear as  cause for muffled hearing.  Ear lavage performed with improvement in symptoms ?- Ear Lavage ? ?2. Decreased hearing, left ?Improved after ear lavage.  Patient will call if symptoms do not continue to improve now that cerumen has been removed or if other symptoms arise ? ? ?General Counseling: yassmine tamm understanding of the findings of todays visit and agrees with plan of treatment. I have discussed any further diagnostic evaluation that may be needed or ordered today. We also reviewed her medications today. she has been encouraged to call the office with any questions or concerns that should arise related to todays visit. ? ? ? ?Counseling: ? ? ? ?Orders Placed This Encounter  ?Procedures  ? Ear Lavage  ? ? ?No orders of the defined types were placed in this encounter. ? ? ?Time spent:35 Minutes ?

## 2022-02-20 ENCOUNTER — Encounter: Payer: Self-pay | Admitting: Physician Assistant

## 2022-02-21 ENCOUNTER — Encounter: Payer: Self-pay | Admitting: Certified Nurse Midwife

## 2022-02-21 ENCOUNTER — Ambulatory Visit (INDEPENDENT_AMBULATORY_CARE_PROVIDER_SITE_OTHER): Payer: BC Managed Care – PPO | Admitting: Certified Nurse Midwife

## 2022-02-21 ENCOUNTER — Other Ambulatory Visit: Payer: Self-pay | Admitting: Physician Assistant

## 2022-02-21 ENCOUNTER — Other Ambulatory Visit: Payer: Self-pay

## 2022-02-21 VITALS — BP 101/66 | HR 85 | Ht 60.0 in | Wt 145.4 lb

## 2022-02-21 DIAGNOSIS — H6690 Otitis media, unspecified, unspecified ear: Secondary | ICD-10-CM

## 2022-02-21 DIAGNOSIS — Z01419 Encounter for gynecological examination (general) (routine) without abnormal findings: Secondary | ICD-10-CM | POA: Diagnosis not present

## 2022-02-21 MED ORDER — AMOXICILLIN-POT CLAVULANATE 875-125 MG PO TABS: 1.0000 | ORAL_TABLET | Freq: Two times a day (BID) | ORAL | 0 refills | Status: DC

## 2022-02-21 NOTE — Patient Instructions (Signed)
Preventive Care 61-32 Years Old, Female ?Preventive care refers to lifestyle choices and visits with your health care provider that can promote health and wellness. Preventive care visits are also called wellness exams. ?What can I expect for my preventive care visit? ?Counseling ?During your preventive care visit, your health care provider may ask about your: ?Medical history, including: ?Past medical problems. ?Family medical history. ?Pregnancy history. ?Current health, including: ?Menstrual cycle. ?Method of birth control. ?Emotional well-being. ?Home life and relationship well-being. ?Sexual activity and sexual health. ?Lifestyle, including: ?Alcohol, nicotine or tobacco, and drug use. ?Access to firearms. ?Diet, exercise, and sleep habits. ?Work and work Statistician. ?Sunscreen use. ?Safety issues such as seatbelt and bike helmet use. ?Physical exam ?Your health care provider may check your: ?Height and weight. These may be used to calculate your BMI (body mass index). BMI is a measurement that tells if you are at a healthy weight. ?Waist circumference. This measures the distance around your waistline. This measurement also tells if you are at a healthy weight and may help predict your risk of certain diseases, such as type 2 diabetes and high blood pressure. ?Heart rate and blood pressure. ?Body temperature. ?Skin for abnormal spots. ?What immunizations do I need? ?Vaccines are usually given at various ages, according to a schedule. Your health care provider will recommend vaccines for you based on your age, medical history, and lifestyle or other factors, such as travel or where you work. ?What tests do I need? ?Screening ?Your health care provider may recommend screening tests for certain conditions. This may include: ?Pelvic exam and Pap test. ?Lipid and cholesterol levels. ?Diabetes screening. This is done by checking your blood sugar (glucose) after you have not eaten for a while (fasting). ?Hepatitis B  test. ?Hepatitis C test. ?HIV (human immunodeficiency virus) test. ?STI (sexually transmitted infection) testing, if you are at risk. ?BRCA-related cancer screening. This may be done if you have a family history of breast, ovarian, tubal, or peritoneal cancers. ?Talk with your health care provider about your test results, treatment options, and if necessary, the need for more tests. ?Follow these instructions at home: ?Eating and drinking ? ?Eat a healthy diet that includes fresh fruits and vegetables, whole grains, lean protein, and low-fat dairy products. ?Take vitamin and mineral supplements as recommended by your health care provider. ?Do not drink alcohol if: ?Your health care provider tells you not to drink. ?You are pregnant, may be pregnant, or are planning to become pregnant. ?If you drink alcohol: ?Limit how much you have to 0-1 drink a day. ?Know how much alcohol is in your drink. In the [place] one drink equals one 12 oz bottle of beer (355 mL), one 5 oz glass of wine (148 mL), or one 1? oz glass of hard liquor (44 mL). ?Lifestyle ?Brush your teeth every morning and night with fluoride toothpaste. Floss one time each day. ?Exercise for at least 30 minutes 5 or more days each week. ?Do not use any products that contain nicotine or tobacco. These products include cigarettes, chewing tobacco, and vaping devices, such as e-cigarettes. If you need help quitting, ask your health care provider. ?Do not use drugs. ?If you are sexually active, practice safe sex. Use a condom or other form of protection to prevent STIs. ?If you do not wish to become pregnant, use a form of birth control. If you plan to become pregnant, see your health care provider for a prepregnancy visit. ?Find healthy ways to manage stress, such as: ?Meditation, yoga,  or listening to music. ?Journaling. ?Talking to a trusted person. ?Spending time with friends and family. ?Minimize exposure to UV radiation to reduce your risk of skin  cancer. ?Safety ?Always wear your seat belt while driving or riding in a vehicle. ?Do not drive: ?If you have been drinking alcohol. Do not ride with someone who has been drinking. ?If you have been using any mind-altering substances or drugs. ?While texting. ?When you are tired or distracted. ?Wear a helmet and other protective equipment during sports activities. ?If you have firearms in your house, make sure you follow all gun safety procedures. ?Seek help if you have been physically or sexually abused. ?What's next? ?Go to your health care provider once a year for an annual wellness visit. ?Ask your health care provider how often you should have your eyes and teeth checked. ?Stay up to date on all vaccines. ?This information is not intended to replace advice given to you by your health care provider. Make sure you discuss any questions you have with your health care provider. ?Document Revised: 05/17/2021 Document Reviewed: 05/17/2021 ?Elsevier Patient Education ? [place]. ? ?

## 2022-02-21 NOTE — Progress Notes (Signed)
? ? ?GYNECOLOGY ANNUAL PREVENTATIVE CARE ENCOUNTER NOTE ? ?History:    ? Tina Rodgers is a 31 y.o. G54P0101 female here for a routine annual gynecologic exam.  Current complaints: none.   Denies abnormal vaginal bleeding, discharge, pelvic pain, problems with intercourse or other gynecologic concerns.  ?  ? ?Social ?Relationship: married ?Living: spouse and daughter ?Work: Geologist, engineering  ?Exercise: 2 x month ?Smoke/Alcohol/drug use: occasional alcohol use, denies drug, smoking, and vaping. ? ?Gynecologic History ?Patient's last menstrual period was 02/01/2022 (exact date). ?Contraception: condoms ?Last Pap: 02/20/2021. Results were: normal with negative HPV ?Last mammogram: n/a.  ? ?Obstetric History ?OB History  ?Gravida Para Term Preterm AB Living  ?1 1   1   1   ?SAB IAB Ectopic Multiple Live Births  ?      0 1  ?  ?# Outcome Date GA Lbr Len/2nd Weight Sex Delivery Anes PTL Lv  ?1 Preterm 07/15/18 [redacted]w[redacted]d 05:05 / 03:10 6 lb 5.6 oz (2.88 kg) F Vag-Spont Local  LIV  ? ? ?Past Medical History:  ?Diagnosis Date  ? ADD (attention deficit disorder)   ? Vitamin D deficiency   ? ? ?Past Surgical History:  ?Procedure Laterality Date  ? BREAST SURGERY    ? 2 lumps removed- neg  ? ? ?Current Outpatient Medications on File Prior to Visit  ?Medication Sig Dispense Refill  ? cholecalciferol (VITAMIN D3) 25 MCG (1000 UNIT) tablet Take 1,000 Units by mouth daily.    ? Multiple Vitamins-Minerals (ONE-A-DAY WOMENS PO) Take by mouth.    ? promethazine-phenylephrine 6.25-5 MG/5ML SYRP Take 5 mLs by mouth every 4 (four) hours as needed for congestion. 180 mL 0  ? sertraline (ZOLOFT) 50 MG tablet Take 1 tablet (50 mg total) by mouth daily. 90 tablet 2  ? ?No current facility-administered medications on file prior to visit.  ? ? ?Allergies  ?Allergen Reactions  ? Dust Mite Mixed Allergen Ext [Mite (D. Farinae)]   ? Sulfa Antibiotics Other (See Comments)  ?  flush  ? ? ?Social History:  reports that she has never smoked. She  has never used smokeless tobacco. She reports current alcohol use. She reports that she does not use drugs. ? ?Family History  ?Problem Relation Age of Onset  ? Ovarian cancer Paternal Grandmother   ? Atrial fibrillation Father   ? Breast cancer Neg Hx   ? Colon cancer Neg Hx   ? Diabetes Neg Hx   ? ? ?The following portions of the patient's history were reviewed and updated as appropriate: allergies, current medications, past family history, past medical history, past social history, past surgical history and problem list. ? ?Review of Systems ?Pertinent items noted in HPI and remainder of comprehensive ROS otherwise negative. ? ?Physical Exam:  ?BP 101/66   Pulse 85   Ht 5' (1.524 m)   Wt 145 lb 6.4 oz (66 kg)   LMP 02/01/2022 (Exact Date)   BMI 28.40 kg/m?  ?CONSTITUTIONAL: Well-developed, well-nourished female in no acute distress.  ?HENT:  Normocephalic, atraumatic, External right and left ear normal. Oropharynx is clear and moist ?EYES: Conjunctivae and EOM are normal. Pupils are equal, round, and reactive to light. No scleral icterus.  ?NECK: Normal range of motion, supple, no masses.  Normal thyroid.  ?SKIN: Skin is warm and dry. No rash noted. Not diaphoretic. No erythema. No pallor. ?MUSCULOSKELETAL: Normal range of motion. No tenderness.  No cyanosis, clubbing, or edema.  2+ distal pulses. ?NEUROLOGIC: Alert and oriented to person,  place, and time. Normal reflexes, muscle tone coordination.  ?PSYCHIATRIC: Normal mood and affect. Normal behavior. Normal judgment and thought content. ?CARDIOVASCULAR: Normal heart rate noted, regular rhythm ?RESPIRATORY: Clear to auscultation bilaterally. Effort and breath sounds normal, no problems with respiration noted. ?BREASTS: Symmetric in size. No masses, tenderness, skin changes, nipple drainage, or lymphadenopathy bilaterally.  ?ABDOMEN: Soft, no distention noted.  No tenderness, rebound or guarding.  ?PELVIC: Normal appearing external genitalia and urethral  meatus; normal appearing vaginal mucosa and cervix.  No abnormal discharge noted.  Pap smear not due.  Normal uterine size, no other palpable masses, no uterine or adnexal tenderness.  . ?  ?Assessment and Plan:  ?  1. Well woman exam with routine gynecological exam ? ? ? ?Pap: Not due until 2027 ?Mammogram : n/a  ?Labs:declines ?Refills:  none ?Referral: none  ?Routine preventative health maintenance measures emphasized. ?Please refer to After Visit Summary for other counseling recommendations.  ?   ? ?Doreene Burke, CNM ?[facility] ?[facility],  [facility]   ?

## 2022-02-22 ENCOUNTER — Encounter: Payer: No Typology Code available for payment source | Admitting: Certified Nurse Midwife

## 2022-02-26 ENCOUNTER — Telehealth: Payer: Self-pay

## 2022-02-26 NOTE — Telephone Encounter (Signed)
Pt wanted to know if she could take benadryl and Claritin together. Spoke to [facility], she said yes they can be taken together. Pt said she is going to be seen in the office tomorrow for a rash, its one big spot, its red and hot.  ?

## 2022-02-27 ENCOUNTER — Other Ambulatory Visit: Payer: Self-pay

## 2022-02-27 ENCOUNTER — Encounter: Payer: Self-pay | Admitting: Internal Medicine

## 2022-02-27 ENCOUNTER — Ambulatory Visit (INDEPENDENT_AMBULATORY_CARE_PROVIDER_SITE_OTHER): Payer: BC Managed Care – PPO | Admitting: Internal Medicine

## 2022-02-27 VITALS — BP 116/79 | HR 78 | Temp 98.4°F | Resp 16 | Ht 60.0 in | Wt 144.8 lb

## 2022-02-27 DIAGNOSIS — L509 Urticaria, unspecified: Secondary | ICD-10-CM

## 2022-02-27 DIAGNOSIS — H6690 Otitis media, unspecified, unspecified ear: Secondary | ICD-10-CM

## 2022-02-27 NOTE — Progress Notes (Signed)
?[facility] ?[address] ?[place], [place] [address] ? ?Internal MEDICINE  ?Office Visit Note ? ?Patient Name: Tina Rodgers ? [id number]  ?[id number] ? ?Date of Service: 02/27/2022 ? ?Chief Complaint  ?Patient presents with  ? Arm Problem  ?  Has rash on right on arm - itchy and irritated, has tried Augmented Betamethasone Dipropionate Ointment 0.05% and took benadryl, second episode of this happening (patient does have eczema)  ? ? ? ?HPI ?Pt is here for a sick visit. ?She noticed a small bump on her left elbow, it was itching and increased in size, she thinks it might have been a insect bite. ?However in the office there is no rash,  ?Denies any fever or chills  ?There is no other rash on her body she is on Augmentin for otitis media which is resolving ? ?Current Medication: ? ?Outpatient Encounter Medications as of 02/27/2022  ?Medication Sig  ? amoxicillin-clavulanate (AUGMENTIN) 875-125 MG tablet Take 1 tablet by mouth 2 (two) times daily. Take with food.  ? cholecalciferol (VITAMIN D3) 25 MCG (1000 UNIT) tablet Take 1,000 Units by mouth daily.  ? Multiple Vitamins-Minerals (ONE-A-DAY WOMENS PO) Take by mouth.  ? promethazine-phenylephrine 6.25-5 MG/5ML SYRP Take 5 mLs by mouth every 4 (four) hours as needed for congestion.  ? sertraline (ZOLOFT) 50 MG tablet Take 1 tablet (50 mg total) by mouth daily.  ? ?No facility-administered encounter medications on file as of 02/27/2022.  ? ? ? ? ?Medical History: ?Past Medical History:  ?Diagnosis Date  ? ADD (attention deficit disorder)   ? Vitamin D deficiency   ? ? ? ?Vital Signs: ?BP 116/79   Pulse 78   Temp 98.4 ?F (36.9 ?C)   Resp 16   Ht 5' (1.524 m)   Wt 144 lb 12.8 oz (65.7 kg)   LMP 02/01/2022 (Exact Date)   SpO2 98%   BMI 28.28 kg/m?  ? ? ?Review of Systems  ?Constitutional:  Negative for fatigue and fever.  ?HENT:  Negative for congestion, mouth sores and postnasal drip.   ?Respiratory:  Negative for cough.   ?Cardiovascular:   Negative for chest pain.  ?Genitourinary:  Negative for flank pain.  ?Skin:  Positive for rash.  ?Psychiatric/Behavioral: Negative.    ? ?Physical Exam ?Constitutional:   ?   Appearance: Normal appearance.  ?Eyes:  ?   Extraocular Movements: Extraocular movements intact.  ?   Pupils: Pupils are equal, round, and reactive to light.  ?Skin: ?   General: Skin is warm.  ?   Comments: No rash noticed, residual mild erythema   ?Neurological:  ?   Mental Status: She is alert.  ? ? ? ? ?Assessment/Plan: ?1. Localized hives ?By history and exam patient has localized hives there is no other rash noted reassurance is given to the patient patient is also instructed to take a picture when she develops this type of rash again and send it through MyChart ? ?2. Acute otitis media, unspecified otitis media type ?Finish antibiotics watch for any other type of rash ? ?General Counseling: cindel daugherty understanding of the findings of todays visit and agrees with plan of treatment. I have discussed any further diagnostic evaluation that may be needed or ordered today. We also reviewed her medications today. she has been encouraged to call the office with any questions or concerns that should arise related to todays visit. ? ? ? ?Counseling: ? ? ? ?No orders of the defined types were placed in this encounter. ? ? ?  No orders of the defined types were placed in this encounter. ? ? ?[place] Controlled Substance Database was reviewed by me. ? ?Time spent:25 Minutes ?

## 2022-03-15 ENCOUNTER — Telehealth: Payer: Self-pay

## 2022-03-15 NOTE — Telephone Encounter (Signed)
Pt called that she hits her head on doll house on Monday  no bleeding ,no dizziness as per alyssa advised her to do on and off ice pack and take advil if her symptoms getting worse she need go to urgent care and call us back for appt  ?

## 2022-03-16 ENCOUNTER — Ambulatory Visit: Payer: BC Managed Care – PPO

## 2022-03-20 ENCOUNTER — Encounter: Payer: Self-pay | Admitting: Certified Nurse Midwife

## 2022-04-05 ENCOUNTER — Ambulatory Visit: Payer: BC Managed Care – PPO | Admitting: Obstetrics

## 2022-04-05 ENCOUNTER — Other Ambulatory Visit (HOSPITAL_COMMUNITY)
Admission: RE | Admit: 2022-04-05 | Discharge: 2022-04-05 | Disposition: A | Discharge status: Home / Self Care | Payer: BC Managed Care – PPO | Source: Ambulatory Visit | Attending: Obstetrics | Admitting: Obstetrics

## 2022-04-05 VITALS — BP 114/74 | HR 88 | Ht 60.0 in | Wt 147.1 lb

## 2022-04-05 DIAGNOSIS — N760 Acute vaginitis: Secondary | ICD-10-CM | POA: Insufficient documentation

## 2022-04-05 DIAGNOSIS — B379 Candidiasis, unspecified: Secondary | ICD-10-CM

## 2022-04-05 DIAGNOSIS — R39198 Other difficulties with micturition: Secondary | ICD-10-CM | POA: Diagnosis not present

## 2022-04-05 NOTE — Progress Notes (Signed)
GYN ENCOUNTER ? ?Subjective ? ?HPI: Tina Rodgers is a 31 y.o. G1P0101 who presents today for painful urination. She reports that about a month ago, she was given antibiotics for an ear infection. She states that she did not remember to take the antibiotics  regularly and missed some doses. Once she finished taking them, she noticed vulvar itching and burning and dysuria and presumed she had a yeast infection. She treated the yeast infection with OTC Monistat, which improved the external itching. However, she still had burning with urination, in addition to a sensation of having to bear down to force the urine out. She noticed blood in her urine on one occasion. She reports low back pain and chills yesterday but denies fever, body aches, N/V, and abdominal pain. She denies exposure to STIs. She reports that she has not had a bowel movement in 2-3 days, which is unusual for her. ? ?Past Medical History:  ?Diagnosis Date  ? ADD (attention deficit disorder)   ? Vitamin D deficiency   ? ?Past Surgical History:  ?Procedure Laterality Date  ? BREAST SURGERY    ? 2 lumps removed- neg  ? ?OB History   ? ? Gravida  ?1  ? Para  ?1  ? Term  ?   ? Preterm  ?1  ? AB  ?   ? Living  ?1  ?  ? ? SAB  ?   ? IAB  ?   ? Ectopic  ?   ? Multiple  ?0  ? Live Births  ?1  ?   ?  ?  ? ?Allergies  ?Allergen Reactions  ? Dust Mite Mixed Allergen Ext [Mite (D. Farinae)]   ? Sulfa Antibiotics Other (See Comments)  ?  flush  ? ?Negative except as noted in HPI ?History obtained from the patient ? ?Objective ? ?Ht 5' (1.524 m)   BMI 28.28 kg/m?  ? ?General appearance: alert, cooperative ?Head: Normocephalic, without obvious abnormality ?Back: No CVA tenderness, mild tenderness to palpation in low back ?Lungs: clear to auscultation bilaterally ?Heart: regular rate and rhythm, S1, S2 normal, no murmur, click, rub or gallop ?Abdomen: soft, no suprapubic tenderness, normal bowel sounds, some tenderness to deep palpation in lower right  abdomen ?Pelvic: External genitalia normal, Vagina normal without discharge, swabs collected ? ?Assessment ?Dysuria ? ?Plan ?Urine culture collected. Encouraged hydration, decreasing coffee intake, rest, AZO. Will follow up based on swabs and UC results. ? ? ?[name], CNM ? ? ?  ?

## 2022-04-07 ENCOUNTER — Emergency Department: Payer: BC Managed Care – PPO

## 2022-04-07 ENCOUNTER — Emergency Department
Admission: EM | Admit: 2022-04-07 | Discharge: 2022-04-07 | Disposition: A | Discharge status: Home / Self Care | Payer: BC Managed Care – PPO | Attending: Student in an Organized Health Care Education/Training Program | Admitting: Student in an Organized Health Care Education/Training Program

## 2022-04-07 ENCOUNTER — Other Ambulatory Visit: Payer: Self-pay

## 2022-04-07 ENCOUNTER — Ambulatory Visit: Payer: BC Managed Care – PPO

## 2022-04-07 DIAGNOSIS — R1032 Left lower quadrant pain: Secondary | ICD-10-CM | POA: Diagnosis present

## 2022-04-07 DIAGNOSIS — K5900 Constipation, unspecified: Secondary | ICD-10-CM

## 2022-04-07 LAB — COMPREHENSIVE METABOLIC PANEL
ALT: 23 U/L (ref 0–44)
AST: 25 U/L (ref 15–41)
Albumin: 4.8 g/dL (ref 3.5–5.0)
Alkaline Phosphatase: 68 U/L (ref 38–126)
Anion gap: 8 (ref 5–15)
BUN: 12 mg/dL (ref 6–20)
CO2: 27 mmol/L (ref 22–32)
Calcium: 9.7 mg/dL (ref 8.9–10.3)
Chloride: 103 mmol/L (ref 98–111)
Creatinine, Ser: 0.82 mg/dL (ref 0.44–1.00)
GFR, Estimated: >60 mL/min (ref >60)
Glucose, Bld: 96 mg/dL (ref 70–99)
Potassium: 3.6 mmol/L (ref 3.5–5.1)
Sodium: 138 mmol/L (ref 135–145)
Total Bilirubin: 0.6 mg/dL (ref 0.3–1.2)
Total Protein: 8.6 g/dL — ABNORMAL HIGH (ref 6.5–8.1)

## 2022-04-07 LAB — POC URINE PREG, ED: Preg Test, Ur: NEGATIVE

## 2022-04-07 LAB — URINALYSIS, ROUTINE W REFLEX MICROSCOPIC
Bilirubin Urine: NEGATIVE
Glucose, UA: NEGATIVE mg/dL
Ketones, ur: NEGATIVE mg/dL
Leukocytes,Ua: NEGATIVE
Nitrite: NEGATIVE
Protein, ur: NEGATIVE mg/dL
Specific Gravity, Urine: 1.003 — ABNORMAL LOW (ref 1.005–1.030)
pH: 8 (ref 5.0–8.0)

## 2022-04-07 LAB — CBC
HCT: 43 % (ref 36.0–46.0)
Hemoglobin: 14.1 g/dL (ref 12.0–15.0)
MCH: 29.3 pg (ref 26.0–34.0)
MCHC: 32.8 g/dL (ref 30.0–36.0)
MCV: 89.4 fL (ref 80.0–100.0)
Platelets: 339 10*3/uL (ref 150–400)
RBC: 4.81 MIL/uL (ref 3.87–5.11)
RDW: 12.4 % (ref 11.5–15.5)
WBC: 9.6 10*3/uL (ref 4.0–10.5)
nRBC: 0 % (ref 0.0–0.2)

## 2022-04-07 LAB — LIPASE, BLOOD: Lipase: 41 U/L (ref 11–51)

## 2022-04-07 MED ORDER — POLYETHYLENE GLYCOL 3350 17 G PO PACK: 17.0000 g | PACK | Freq: Every day | ORAL | 0 refills | Status: DC

## 2022-04-07 MED ORDER — IOHEXOL 300 MG/ML  SOLN
80.0000 mL | Freq: Once | INTRAMUSCULAR | Status: AC | PRN
Start: 2022-04-07 — End: 2022-04-07
  Administered 2022-04-07: 80 mL via INTRAVENOUS
  Filled 2022-04-07: qty 80

## 2022-04-07 NOTE — ED Triage Notes (Signed)
Pt states she was seen at [facility] lower abd pain- pt states she was sent here after she had increased LLQ pain with palpation- pt states she does feel bloated and does admit to being constipated- pt states pain started about 24 hrs ago ?

## 2022-04-07 NOTE — Discharge Instructions (Addendum)
Your blood work, urinalysis, CT scan and [place] do not demonstrate any acute findings. Please take the miralax as scheduled. Return for any new, worsening, or change in symptoms or other concerns. ?

## 2022-04-07 NOTE — ED Provider Notes (Signed)
History of ? ?[facility] ?Provider Note ? ? ? Event Date/Time  ? First MD Initiated Contact with Patient 04/07/22 1815   ?  (approximate) ? ? ?History  ? ?Abdominal Pain ? ? ?HPI ? ?Tina Rodgers is a 31 y.o. female with a past medical history of anxiety, dysuria, ADHD who presents today for evaluation of left lower quadrant pain.  Patient reports that this began approximately 24 hours ago.  She reports that she is been having to strain to move her bowels and also to urinate.  She also reports that her abdomen feels distended.  She denies any back pain or leg weakness.  She denies any saddle esthesia.  She has not had any fevers or chills.  She reports that she has had recently "a lot" of gas.  She denies any fevers or chills.  She has not had any burping.  She reports that she is sexually active with 1 female partner, no vaginal discharge, is not concerned about sexually transmitted diseases.  No history of abdominal surgery. ? ? ?Patient Active Problem List  ? Diagnosis Date Noted  ? Other fatigue 02/05/2020  ? Atopic dermatitis 07/13/2019  ? Sore throat 02/26/2019  ? Bacterial pharyngitis 02/26/2019  ? Generalized anxiety disorder 01/18/2019  ? Encounter for PPD test 01/18/2019  ? Contraception 01/12/2019  ? Psoriasis 01/12/2019  ? 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 Anxiety and depression 11/10/2018  ? Dysuria 12/04/2017  ? Attention deficit hyperactivity disorder (ADHD), predominantly hyperactive type 12/04/2017  ? ? ?  ? ? ?Physical Exam  ? ?Triage Vital Signs: ?ED Triage Vitals  ?Enc Vitals Group  ?   BP 04/07/22 1635 128/86  ?   Pulse Rate 04/07/22 1635 74  ?   Resp 04/07/22 1635 18  ?   Temp 04/07/22 1635 98.6 ?F (37 ?C)  ?   Temp Source 04/07/22 1635 Oral  ?   SpO2 04/07/22 1635 100 %  ?   Weight 04/07/22 1633 146 lb (66.2 kg)  ?   Height 04/07/22 1633 5' (1.524 m)  ?   Head Circumference --   ?   Peak Flow --   ?   Pain Score 04/07/22 1633 5  ?   Pain  Loc --   ?   Pain Edu? --   ?   Excl. in GC? --   ? ? ?Most recent vital signs: ?Vitals:  ? 04/07/22 1835 04/07/22 2222  ?BP: 125/61 114/64  ?Pulse: 85 86  ?Resp: 20 16  ?Temp: 98.3 ?F (36.8 ?C)   ?SpO2: 99% 98%  ? ? ?General: Awake, no distress.  ?CV:  Good peripheral perfusion.  ?Resp:  Normal effort.  ?Abd:  Abdomen is soft, tender in the LLQ. No rebound or guarding.  ?Other:  Declined pelvic ? ? ?ED Results / Procedures / Treatments  ? ?Labs ?(all labs ordered are listed, but only abnormal results are displayed) ?Labs Reviewed  ?COMPREHENSIVE METABOLIC PANEL - Abnormal; Notable for the following components:  ?    Result Value  ? Total Protein 8.6 (*)   ? All other components within normal limits  ?URINALYSIS, ROUTINE W REFLEX MICROSCOPIC - Abnormal; Notable for the following components:  ? Color, Urine YELLOW (*)   ? APPearance CLEAR (*)   ? Specific Gravity, Urine 1.003 (*)   ? Hgb urine dipstick LARGE (*)   ? Bacteria, UA RARE (*)   ? All other components within normal limits  ?LIPASE, BLOOD  ?  CBC  ?POC URINE PREG, ED  ? ? ? ?EKG ? ? ? ? ?RADIOLOGY ?I reviewed imaging independently and agree with radiologist findings ? ? ? ?PROCEDURES: ? ?Critical Care performed:  ? ?Procedures ? ? ?MEDICATIONS ORDERED IN ED: ?Medications  ?iohexol (OMNIPAQUE) 300 MG/ML solution 80 mL (80 mLs Intravenous Contrast Given 04/07/22 1928)  ? ? ? ?IMPRESSION / MDM / ASSESSMENT AND PLAN / ED COURSE  ?I reviewed the triage vital signs and the nursing notes. ? ? ?Differential diagnosis includes, but is not limited to, diverticulitis, urinary tract infection, constipation, enteritis, obstruction, kidney stone, pyelonephritis, ovarian cyst, less likely ovarian torsion.  Also less likely to be TOA, patient declines concern for sexually transmitted diseases, no recent vaginal discharge.  She is currently menstruating, pregnancy negative, doubt ectopic.  Labs were obtained and are overall reassuring.  Urinalysis does reveal blood, though  patient reports that she is currently menstruating.  I recommend a CAT scan for further evaluation of her abdominal pain, and patient and mother agree with this plan.  She was offered analgesia/antiemetics but declined.  CT scan demonstrates no acute intra-abdominal or intrapelvic abnormalities.  Given her persistent unilateral pain, patient agreed to undergo ultrasound for evaluation of torsion, also negative for acute pathology. Given that patient feels constipated we initiated a bowel regimen. She does admit to eating mostly carbs and only drinking 2 glasses of water per day.  We discussed dietary modification and increasing hydration.  Also discussed return precautions and outpatient follow-up.  Patient and her mother understand and agree with plan.  Discharged in stable condition. ? ?Clinical Course as of 04/07/22 2329  ?Sat Apr 07, 2022  ?1901 Hgb urine dipstick(!): LARGE ?Patient reports that she is currently menstruating [JP]  ?2010 Discussed CT results, patient still uncomfortable but declining meds, agrees with [place] [JP]  ?  ?Clinical Course User Index ?[JP] Irelynd Zumstein, Herb Grays, PA-C  ? ? ? ?FINAL CLINICAL IMPRESSION(S) / ED DIAGNOSES  ? ?Final diagnoses:  ?Constipation, unspecified constipation type  ? ? ? ?Rx / DC Orders  ? ?ED Discharge Orders   ? ?      Ordered  ?  polyethylene glycol (MIRALAX) 17 g packet  Daily       ? 04/07/22 2153  ? ?  ?  ? ?  ? ? ? ?Note:  This document was prepared using Dragon voice recognition software and may include unintentional dictation errors. ?  ?Jackelyn Hoehn, PA-C ?04/07/22 2330 ? ?  ?Willy Eddy, MD ?04/08/22 1118 ? ?

## 2022-04-08 LAB — URINE CULTURE

## 2022-04-10 ENCOUNTER — Encounter: Payer: Self-pay | Admitting: Nurse Practitioner

## 2022-04-10 ENCOUNTER — Ambulatory Visit (INDEPENDENT_AMBULATORY_CARE_PROVIDER_SITE_OTHER): Payer: BC Managed Care – PPO | Admitting: Nurse Practitioner

## 2022-04-10 ENCOUNTER — Encounter: Payer: Self-pay | Admitting: Obstetrics

## 2022-04-10 ENCOUNTER — Other Ambulatory Visit: Payer: Self-pay | Admitting: Obstetrics

## 2022-04-10 VITALS — BP 121/76 | HR 99 | Temp 98.8°F | Resp 16 | Ht 60.0 in | Wt 145.2 lb

## 2022-04-10 DIAGNOSIS — R630 Anorexia: Secondary | ICD-10-CM | POA: Diagnosis not present

## 2022-04-10 DIAGNOSIS — R109 Unspecified abdominal pain: Secondary | ICD-10-CM

## 2022-04-10 DIAGNOSIS — K30 Functional dyspepsia: Secondary | ICD-10-CM

## 2022-04-10 DIAGNOSIS — R140 Abdominal distension (gaseous): Secondary | ICD-10-CM

## 2022-04-10 DIAGNOSIS — R110 Nausea: Secondary | ICD-10-CM

## 2022-04-10 DIAGNOSIS — N3000 Acute cystitis without hematuria: Secondary | ICD-10-CM

## 2022-04-10 DIAGNOSIS — R6881 Early satiety: Secondary | ICD-10-CM

## 2022-04-10 LAB — CERVICOVAGINAL ANCILLARY ONLY
Bacterial Vaginitis (gardnerella): POSITIVE — AB
Candida Glabrata: NEGATIVE
Candida Vaginitis: NEGATIVE
Comment: NEGATIVE
Comment: NEGATIVE
Comment: NEGATIVE

## 2022-04-10 MED ORDER — NITROFURANTOIN MONOHYD MACRO 100 MG PO CAPS: 100.0000 mg | ORAL_CAPSULE | Freq: Two times a day (BID) | ORAL | 0 refills | Status: AC

## 2022-04-10 MED ORDER — METRONIDAZOLE 500 MG PO TABS: 500.0000 mg | ORAL_TABLET | Freq: Two times a day (BID) | ORAL | 0 refills | Status: DC

## 2022-04-10 MED ORDER — DICYCLOMINE HCL 20 MG PO TABS: 20.0000 mg | ORAL_TABLET | Freq: Four times a day (QID) | ORAL | 1 refills | Status: DC

## 2022-04-10 NOTE — Progress Notes (Signed)
[facility] [address] [place], [place] [address]  Internal MEDICINE  Office Visit Note  Patient Name: Tina Rodgers  [id number]  [id number]  Date of Service: 04/10/2022  Chief Complaint  Patient presents with   Follow-up    Hospital follow up for abdominal pain    HPI Gala presents for follow-up visit after going to the ED for abdominal pain and bloating.  She reports that she always seems to have loose stools.  She has been experiencing persistent bloating and abdominal cramping.  She has had a lack of appetite for the past 4 days.  She reports having a low-grade fever.  She reports having some urinary urgency denies any hematuria.  She reports having to strain when attempting to have a bowel movement.  She also reports having rectal pain and rectal bleeding with bowel movements.  The CT scan of the abdomen and ultrasound of the pelvis were normal from the imaging done in the emergency department. The abdominal pain has not improved, over-the-counter medications have not helped.  She reports having a feeling of early satiety when she tries to eat.  She reports that her abdomen is very tender to light palpation.     Current Medication: Outpatient Encounter Medications as of 04/10/2022  Medication Sig   cholecalciferol (VITAMIN D3) 25 MCG (1000 UNIT) tablet Take 1,000 Units by mouth daily.   Multiple Vitamins-Minerals (ONE-A-DAY WOMENS PO) Take by mouth.   [EXPIRED] nitrofurantoin, macrocrystal-monohydrate, (MACROBID) 100 MG capsule Take 1 capsule (100 mg total) by mouth 2 (two) times daily for 7 days. (Patient not taking: Reported on 04/11/2022)   polyethylene glycol (MIRALAX) 17 g packet Take 17 g by mouth daily.   sertraline (ZOLOFT) 50 MG tablet Take 1 tablet (50 mg total) by mouth daily.   [DISCONTINUED] amoxicillin-clavulanate (AUGMENTIN) 875-125 MG tablet Take 1 tablet by mouth 2 (two) times daily. Take with food. (Patient not taking: Reported on  04/11/2022)   [DISCONTINUED] dicyclomine (BENTYL) 20 MG tablet Take 1 tablet (20 mg total) by mouth every 6 (six) hours. (Patient not taking: Reported on 04/25/2022)   [DISCONTINUED] promethazine-phenylephrine 6.25-5 MG/5ML SYRP Take 5 mLs by mouth every 4 (four) hours as needed for congestion. (Patient not taking: Reported on 04/11/2022)   No facility-administered encounter medications on file as of 04/10/2022.    Surgical History: Past Surgical History:  Procedure Laterality Date   BREAST SURGERY     2 lumps removed- neg   COLONOSCOPY WITH PROPOFOL N/A 04/25/2022   Procedure: COLONOSCOPY WITH PROPOFOL;  Surgeon: Toney Reil, MD;  Location: [facility];  Service: Gastroenterology;  Laterality: N/A;    Medical History: Past Medical History:  Diagnosis Date   ADD (attention deficit disorder)    Vitamin D deficiency     Family History: Family History  Problem Relation Age of Onset   Ovarian cancer Paternal Grandmother    Atrial fibrillation Father    Breast cancer Neg Hx    Colon cancer Neg Hx    Diabetes Neg Hx     Social History   Socioeconomic History   Marital status: Single    Spouse name: Not on file   Number of children: Not on file   Years of education: Not on file   Highest education level: Not on file  Occupational History   Not on file  Tobacco Use   Smoking status: Never   Smokeless tobacco: Never  Vaping Use   Vaping Use: Never used  Substance and Sexual  Activity   Alcohol use: Yes    Comment: ocassionally   Drug use: No   Sexual activity: Yes    Birth control/protection: Condom  Other Topics Concern   Not on file  Social History Narrative   Not on file   Social Determinants of Health   Financial Resource Strain: Not on file  Food Insecurity: Not on file  Transportation Needs: Not on file  Physical Activity: Not on file  Stress: Not on file  Social Connections: Not on file  Intimate Partner Violence: Not on file      Review of  Systems  Constitutional:  Positive for appetite change, chills, fatigue and fever.  HENT:  Negative for sore throat and trouble swallowing.   Respiratory: Negative.  Negative for cough, chest tightness, shortness of breath and wheezing.   Cardiovascular: Negative.  Negative for chest pain and palpitations.  Gastrointestinal:  Positive for abdominal distention, abdominal pain, anal bleeding, blood in stool, constipation, diarrhea, nausea and rectal pain. Negative for vomiting.  Endocrine: Negative.  Negative for polydipsia, polyphagia and polyuria.  Genitourinary:  Positive for urgency. Negative for flank pain, frequency and hematuria.  Skin: Negative.   Psychiatric/Behavioral:  The patient is nervous/anxious.     Vital Signs: BP 121/76   Pulse 99   Temp 98.8 F (37.1 C)   Resp 16   Ht 5' (1.524 m)   Wt 145 lb 3.2 oz (65.9 kg)   SpO2 99%   BMI 28.36 kg/m    Physical Exam Vitals reviewed.  Constitutional:      General: She is not in acute distress.    Appearance: Normal appearance. She is normal weight. She is ill-appearing.  HENT:     Head: Normocephalic and atraumatic.     Mouth/Throat:     Pharynx: No posterior oropharyngeal erythema.  Eyes:     Pupils: Pupils are equal, round, and reactive to light.  Cardiovascular:     Rate and Rhythm: Normal rate and regular rhythm.     Heart sounds: Normal heart sounds. No murmur heard. Pulmonary:     Effort: Pulmonary effort is normal. No respiratory distress.     Breath sounds: Normal breath sounds. No wheezing.  Abdominal:     General: Bowel sounds are increased. There is distension.     Palpations: Abdomen is soft. There is no shifting dullness, fluid wave, mass or pulsatile mass.     Tenderness: There is generalized abdominal tenderness (very tender to light palpation, unable to perform deep palpation.). There is guarding.     Hernia: No hernia is present.  Neurological:     Mental Status: She is alert and oriented to person,  place, and time.        Assessment/Plan: 1. Delayed gastric emptying Gastric emptying imaging done to evaluate for gastroparesis, celiac panel to rule out celiac disease, food allergy panel to rule out food allergies as a cause. GI referral for further evaluation of possible causes.  - Ambulatory referral to Gastroenterology - Celiac Disease Panel - NM Gastric Emptying; Future - IgE Food w/Component Reflex II  2. Abdominal bloating with cramps See problem #1; bentyl also prescribed to help with abdominal cramping and pain - Ambulatory referral to Gastroenterology - Celiac Disease Panel - NM Gastric Emptying; Future - IgE Food w/Component Reflex II  3. Nausea See problem #1 - Ambulatory referral to Gastroenterology - Celiac Disease Panel - NM Gastric Emptying; Future - IgE Food w/Component Reflex II  4. Lack of appetite See  problem #1 - Ambulatory referral to Gastroenterology - Celiac Disease Panel - NM Gastric Emptying; Future - IgE Food w/Component Reflex II  5. Early satiety See problem #1 - Ambulatory referral to Gastroenterology - Celiac Disease Panel - NM Gastric Emptying; Future - IgE Food w/Component Reflex II  6. Acute cystitis without hematuria Empiric treatment of possible UTI, if other symptoms still persist after treatment, may be gastrointestinal in nature.  - nitrofurantoin, macrocrystal-monohydrate, (MACROBID) 100 MG capsule; Take 1 capsule (100 mg total) by mouth 2 (two) times daily for 7 days. (Patient not taking: Reported on 04/11/2022)  Dispense: 14 capsule; Refill: 0   General Counseling: Napoleon FormKathryn verbalizes understanding of the findings of todays visit and agrees with plan of treatment. I have discussed any further diagnostic evaluation that may be needed or ordered today. We also reviewed her medications today. she has been encouraged to call the office with any questions or concerns that should arise related to todays visit.    Orders Placed  This Encounter  Procedures   NM Gastric Emptying   Celiac Disease Panel   IgE Food w/Component Reflex II   Ambulatory referral to Gastroenterology    Meds ordered this encounter  Medications   DISCONTD: dicyclomine (BENTYL) 20 MG tablet    Sig: Take 1 tablet (20 mg total) by mouth every 6 (six) hours.    Dispense:  120 tablet    Refill:  1   nitrofurantoin, macrocrystal-monohydrate, (MACROBID) 100 MG capsule    Sig: Take 1 capsule (100 mg total) by mouth 2 (two) times daily for 7 days.    Dispense:  14 capsule    Refill:  0    Return in about 3 weeks (around 05/01/2022) for F/U, Labs and imaging with Hamilton CapriLauren PA-C PCP.   Total time spent:30 Minutes Time spent includes review of chart, medications, test results, and follow up plan with the patient.   [place] Controlled Substance Database was reviewed by me.  This patient was seen by Sallyanne KusterAlyssa Dan Scearce, FNP-C in collaboration with Dr. Beverely RisenFozia Khan as a part of collaborative care agreement.   Yaniel Limbaugh R. Tedd SiasAbernathy, MSN, FNP-C Internal medicine

## 2022-04-11 ENCOUNTER — Encounter: Payer: Self-pay | Admitting: Gastroenterology

## 2022-04-11 ENCOUNTER — Other Ambulatory Visit: Payer: Self-pay

## 2022-04-11 ENCOUNTER — Telehealth: Payer: Self-pay

## 2022-04-11 ENCOUNTER — Ambulatory Visit (INDEPENDENT_AMBULATORY_CARE_PROVIDER_SITE_OTHER): Payer: BC Managed Care – PPO | Admitting: Gastroenterology

## 2022-04-11 VITALS — BP 104/70 | HR 97 | Temp 98.8°F | Ht 60.0 in | Wt 145.1 lb

## 2022-04-11 DIAGNOSIS — R195 Other fecal abnormalities: Secondary | ICD-10-CM | POA: Diagnosis not present

## 2022-04-11 DIAGNOSIS — K625 Hemorrhage of anus and rectum: Secondary | ICD-10-CM

## 2022-04-11 NOTE — Telephone Encounter (Signed)
Pt called that ED dr pres her flagyl for Bacterial vaginitis as per alyssa advised her to hold for macrobid  for now after she finished flagyl if she still have urine urgency or UTI symptoms then she can start otherwise hold for now  ?

## 2022-04-11 NOTE — Progress Notes (Signed)
?  ?Arlyss Repress, MD ?[address]  ?[address]  ?[place], [place] [address]  ?Main: [phone]  ?Fax: [phone] ? ? ? ?Gastroenterology Consultation ? ?Referring Provider:     Sallyanne Kuster, NP ?Primary Care Physician:  Lyndon Code, MD ?Primary Gastroenterologist:  Dr. Arlyss Repress ?Reason for Consultation:     Loose stools, right lower quadrant pain, abdominal bloating ?      ? HPI:   ?Jaeda Bruso is a 31 y.o. female referred by Dr. Lyndon Code, MD  for consultation & management of 1 week history of loose stools, 1 episode of blood seen on the surface of the stool, associated with right lower quadrant discomfort and abdominal bloating.  Patient reports that she was treated with Augmentin for ear infection about a month ago.  She is recently started on metronidazole, started taking it yesterday and reports that abdominal bloating is somewhat better.  She underwent CBC, CMP, lipase which were unremarkable.  CT abdomen and pelvis with contrast was unremarkable.  Urine pregnancy test was negative.  Patient reports that she is scheduled for H. pylori breath test on Friday.  Patient was in normal state of health about a week ago.  She reports eating out about twice a week.  She does not smoke or drink alcohol.  She chews gum regularly.  She does report subjective fever.  And nausea ? ?NSAIDs: None ? ?Antiplts/Anticoagulants/Anti thrombotics: None ? ?GI Procedures: None ?She denies family history of IBD ? ?Past Medical History:  ?Diagnosis Date  ? ADD (attention deficit disorder)   ? Vitamin D deficiency   ? ? ?Past Surgical History:  ?Procedure Laterality Date  ? BREAST SURGERY    ? 2 lumps removed- neg  ? ?Current Outpatient Medications:  ?  cholecalciferol (VITAMIN D3) 25 MCG (1000 UNIT) tablet, Take 1,000 Units by mouth daily., Disp: , Rfl:  ?  fluticasone (FLONASE) 50 MCG/ACT nasal spray, Place 2 sprays into both nostrils daily., Disp: , Rfl:  ?  metroNIDAZOLE (FLAGYL) 500 MG tablet,  Take 1 tablet (500 mg total) by mouth 2 (two) times daily., Disp: 14 tablet, Rfl: 0 ?  Multiple Vitamins-Minerals (ONE-A-DAY WOMENS PO), Take by mouth., Disp: , Rfl:  ?  polyethylene glycol (MIRALAX) 17 g packet, Take 17 g by mouth daily., Disp: 14 each, Rfl: 0 ?  sertraline (ZOLOFT) 50 MG tablet, Take 1 tablet (50 mg total) by mouth daily., Disp: 90 tablet, Rfl: 2 ?  dicyclomine (BENTYL) 20 MG tablet, Take 1 tablet (20 mg total) by mouth every 6 (six) hours. (Patient not taking: Reported on 04/11/2022), Disp: 120 tablet, Rfl: 1 ?  nitrofurantoin, macrocrystal-monohydrate, (MACROBID) 100 MG capsule, Take 1 capsule (100 mg total) by mouth 2 (two) times daily for 7 days. (Patient not taking: Reported on 04/11/2022), Disp: 14 capsule, Rfl: 0 ? ?Family History  ?Problem Relation Age of Onset  ? Ovarian cancer Paternal Grandmother   ? Atrial fibrillation Father   ? Breast cancer Neg Hx   ? Colon cancer Neg Hx   ? Diabetes Neg Hx   ?  ? ?Social History  ? ?Tobacco Use  ? Smoking status: Never  ? Smokeless tobacco: Never  ?Vaping Use  ? Vaping Use: Never used  ?Substance Use Topics  ? Alcohol use: Yes  ?  Comment: ocassionally  ? Drug use: No  ? ? ?Allergies as of 04/11/2022 - Review Complete 04/11/2022  ?Allergen Reaction Noted  ? Dust mite mixed allergen ext [  mite (d. farinae)]  02/19/2022  ? Sulfa antibiotics Other (See Comments)   ? ? ?Review of Systems:    ?All systems reviewed and negative except where noted in HPI. ? ? Physical Exam:  ?BP 104/70 (BP Location: Left Arm, Patient Position: Sitting, Cuff Size: Normal)   Pulse 97   Temp 98.8 ?F (37.1 ?C) (Oral)   Ht 5' (1.524 m)   Wt 145 lb 2 oz (65.8 kg)   BMI 28.34 kg/m?  ?No LMP recorded. ? ?General:   Alert,  Well-developed, well-nourished, pleasant and cooperative in NAD ?Head:  Normocephalic and atraumatic. ?Eyes:  Sclera clear, no icterus.   Conjunctiva pink. ?Ears:  Normal auditory acuity. ?Nose:  No deformity, discharge, or lesions. ?Mouth:  No deformity or  lesions,oropharynx pink & moist. ?Neck:  Supple; no masses or thyromegaly. ?Lungs:  Respirations even and unlabored.  Clear throughout to auscultation.   No wheezes, crackles, or rhonchi. No acute distress. ?Heart:  Regular rate and rhythm; no murmurs, clicks, rubs, or gallops. ?Abdomen:  Normal bowel sounds. Soft, non-tender and mildly distended, tympanic to percussion without masses, hepatosplenomegaly or hernias noted.  No guarding or rebound tenderness.   ?Rectal: Not performed ?Msk:  Symmetrical without gross deformities. Good, equal movement & strength bilaterally. ?Pulses:  Normal pulses noted. ?Extremities:  No clubbing or edema.  No cyanosis. ?Neurologic:  Alert and oriented x3;  grossly normal neurologically. ?Skin:  Intact without significant lesions or rashes. No jaundice. ?Psych:  Alert and cooperative. Normal mood and affect. ? ?Imaging Studies: ?Reviewed ? ?Assessment and Plan:  ? ?Tanairy Payeur is a 31 y.o. female with no significant past medical history seen in consultation for 1 week history of loose stools, right lower quadrant discomfort associated with subjective fever, nausea, abdominal bloating.  Patient was treated with Augmentin for an ear infection about a month ago ? ?Agree with H. pylori breath test ?Recommend GI profile PCR to rule out infection, particularly C. difficile given recent antibiotic use.  If these tests are negative, recommend colonoscopy with TI evaluation to rule out IBD ? ?Follow up based on the above work-up ? ? ?Arlyss Repress, MD ? ?

## 2022-04-13 ENCOUNTER — Encounter
Admission: RE | Admit: 2022-04-13 | Discharge: 2022-04-13 | Disposition: A | Discharge status: Home / Self Care | Payer: BC Managed Care – PPO | Source: Ambulatory Visit | Attending: Nurse Practitioner | Admitting: Nurse Practitioner

## 2022-04-13 ENCOUNTER — Other Ambulatory Visit
Admission: RE | Admit: 2022-04-13 | Discharge: 2022-04-13 | Disposition: A | Discharge status: Home / Self Care | Payer: BC Managed Care – PPO | Source: Home / Self Care | Attending: Gastroenterology | Admitting: Gastroenterology

## 2022-04-13 ENCOUNTER — Other Ambulatory Visit: Payer: Self-pay

## 2022-04-13 DIAGNOSIS — R109 Unspecified abdominal pain: Secondary | ICD-10-CM | POA: Diagnosis present

## 2022-04-13 DIAGNOSIS — R6881 Early satiety: Secondary | ICD-10-CM | POA: Insufficient documentation

## 2022-04-13 DIAGNOSIS — R110 Nausea: Secondary | ICD-10-CM | POA: Insufficient documentation

## 2022-04-13 DIAGNOSIS — R630 Anorexia: Secondary | ICD-10-CM | POA: Insufficient documentation

## 2022-04-13 DIAGNOSIS — K30 Functional dyspepsia: Secondary | ICD-10-CM | POA: Insufficient documentation

## 2022-04-13 DIAGNOSIS — R140 Abdominal distension (gaseous): Secondary | ICD-10-CM | POA: Diagnosis present

## 2022-04-13 DIAGNOSIS — R195 Other fecal abnormalities: Secondary | ICD-10-CM

## 2022-04-13 MED ORDER — TECHNETIUM TC 99M SULFUR COLLOID
2.0000 | Freq: Once | INTRAVENOUS | Status: AC | PRN
  Administered 2022-04-13: 2.13 via ORAL

## 2022-04-13 MED ORDER — TECHNETIUM TC 99M SULFUR COLLOID: 2.1300 | Freq: Once | INTRAVENOUS | Status: DC

## 2022-04-13 NOTE — Progress Notes (Signed)
Patient states that in Dr. Marius Ditch office visit notes she wanted her to have a h pylori breath test also. She states this has not been order. Order it and she will go to the hospital lab to have it done  ?

## 2022-04-14 LAB — GI PROFILE, STOOL, PCR

## 2022-04-14 LAB — H. PYLORI BREATH TEST: H. pylori UBiT: NEGATIVE

## 2022-04-16 ENCOUNTER — Other Ambulatory Visit: Payer: Self-pay

## 2022-04-16 ENCOUNTER — Telehealth: Payer: Self-pay

## 2022-04-16 DIAGNOSIS — R195 Other fecal abnormalities: Secondary | ICD-10-CM

## 2022-04-16 DIAGNOSIS — K625 Hemorrhage of anus and rectum: Secondary | ICD-10-CM

## 2022-04-16 MED ORDER — NA SULFATE-K SULFATE-MG SULF 17.5-3.13-1.6 GM/177ML PO SOLN: 354.0000 mL | Freq: Once | ORAL | 0 refills | Status: AC

## 2022-04-16 NOTE — Telephone Encounter (Signed)
Called patient and patient verbalized understanding of instructions. Schedule colonoscopy for 04/25/2022. Sent prep to pharmacy and instructions to mychart and mailed  ?

## 2022-04-16 NOTE — Telephone Encounter (Signed)
-----   Message from Toney Reil, MD sent at 04/15/2022 11:39 PM EDT ----- ?Please inform patient that the H. pylori breath test as well as stool studies came back negative for infection.  Therefore, I recommend colonoscopy as discussed during office visit ? ?Rohini Vanga ?

## 2022-04-17 ENCOUNTER — Telehealth: Payer: Self-pay | Admitting: Obstetrics

## 2022-04-17 NOTE — Telephone Encounter (Signed)
Pt called in tears, stating that she is having burning when she urinates,states that she was just treated for BV was treated and today was last of treatment. Pt states that she is experiencing a lot of stomach pain and bloating. I was trying to get pt scheduled for an apt but no opening for [facility] within next 2-3 weeks. Please advise. Sending to both on call providers ?

## 2022-04-18 NOTE — Telephone Encounter (Signed)
Spoke with pt- scheduled for urine drop off on nurse schedule this Friday.  ?

## 2022-04-19 ENCOUNTER — Encounter: Payer: Self-pay | Admitting: Physician Assistant

## 2022-04-19 ENCOUNTER — Ambulatory Visit (INDEPENDENT_AMBULATORY_CARE_PROVIDER_SITE_OTHER): Payer: BC Managed Care – PPO | Admitting: Physician Assistant

## 2022-04-19 VITALS — BP 127/74 | HR 96 | Temp 97.6°F | Resp 16 | Ht 60.0 in | Wt 142.4 lb

## 2022-04-19 DIAGNOSIS — R109 Unspecified abdominal pain: Secondary | ICD-10-CM

## 2022-04-19 DIAGNOSIS — R300 Dysuria: Secondary | ICD-10-CM | POA: Diagnosis not present

## 2022-04-19 DIAGNOSIS — R110 Nausea: Secondary | ICD-10-CM

## 2022-04-19 DIAGNOSIS — R140 Abdominal distension (gaseous): Secondary | ICD-10-CM

## 2022-04-19 LAB — POCT URINALYSIS DIPSTICK
Glucose, UA: NEGATIVE
Nitrite, UA: NEGATIVE
Protein, UA: NEGATIVE
Spec Grav, UA: 1.015 (ref 1.010–1.025)
Urobilinogen, UA: 0.2 E.U./dL
pH, UA: 7 (ref 5.0–8.0)

## 2022-04-19 MED ORDER — CIPROFLOXACIN HCL 500 MG PO TABS: 500.0000 mg | ORAL_TABLET | Freq: Two times a day (BID) | ORAL | 0 refills | Status: DC

## 2022-04-19 MED ORDER — FLUCONAZOLE 150 MG PO TABS: 150.0000 mg | ORAL_TABLET | Freq: Once | ORAL | 0 refills | Status: AC

## 2022-04-19 MED ORDER — ONDANSETRON HCL 4 MG PO TABS: 4.0000 mg | ORAL_TABLET | Freq: Three times a day (TID) | ORAL | 0 refills | Status: DC | PRN

## 2022-04-19 NOTE — Progress Notes (Signed)
[facility] [address] [place], [place] [address]  Internal MEDICINE  Office Visit Note  Patient Name: Tina Rodgers  [id number]  [id number]  Date of Service: 04/22/2022  Chief Complaint  Patient presents with   Urinary Tract Infection    Still having burning with urination - hard to maintain steady stream + always nauseas and has stomach pain     HPI Pt is here for a sick visit. -Burning with urination for the last month, difficulty maintaining stream, urgency, frequency  -Abdominal bloating and pain and decreased appetite, nausea, reflux -has lost 5lbs since last week -constipation and non constipation cycling -Patient was seen on OBGYN a few weeks ago where urine was checked, but was diagnosed and treated for BV -She went to ED 5/6/23for constipation and abdominal pain and had labs checked including pernancy test. Also had CT abd/pelvis and [place] of pelvis and neither showed any acute abnormalities -She is also now being followed by GI and has colonoscopy scheduled for next week  Current Medication:  Outpatient Encounter Medications as of 04/19/2022  Medication Sig   cholecalciferol (VITAMIN D3) 25 MCG (1000 UNIT) tablet Take 1,000 Units by mouth daily.   ciprofloxacin (CIPRO) 500 MG tablet Take 1 tablet (500 mg total) by mouth 2 (two) times daily for 10 days.   dicyclomine (BENTYL) 20 MG tablet Take 1 tablet (20 mg total) by mouth every 6 (six) hours.   [EXPIRED] fluconazole (DIFLUCAN) 150 MG tablet Take 1 tablet (150 mg total) by mouth once for 1 dose.   fluticasone (FLONASE) 50 MCG/ACT nasal spray Place 2 sprays into both nostrils daily.   metroNIDAZOLE (FLAGYL) 500 MG tablet Take 1 tablet (500 mg total) by mouth 2 (two) times daily.   Multiple Vitamins-Minerals (ONE-A-DAY WOMENS PO) Take by mouth.   ondansetron (ZOFRAN) 4 MG tablet Take 1 tablet (4 mg total) by mouth every 8 (eight) hours as needed for nausea or vomiting.   polyethylene glycol  (MIRALAX) 17 g packet Take 17 g by mouth daily.   sertraline (ZOLOFT) 50 MG tablet Take 1 tablet (50 mg total) by mouth daily.   No facility-administered encounter medications on file as of 04/19/2022.      Medical History: Past Medical History:  Diagnosis Date   ADD (attention deficit disorder)    Vitamin D deficiency      Vital Signs: BP 127/74   Pulse 96   Temp 97.6 F (36.4 C)   Resp 16   Ht 5' (1.524 m)   Wt 142 lb 6.4 oz (64.6 kg)   LMP 03/31/2022 (Exact Date) Comment: not breastfeeding.  SpO2 99%   BMI 27.81 kg/m    Review of Systems  Constitutional:  Negative for fatigue and fever.  HENT:  Negative for congestion, mouth sores and postnasal drip.   Respiratory:  Negative for cough.   Cardiovascular:  Negative for chest pain.  Gastrointestinal:  Positive for abdominal pain, constipation and nausea.  Genitourinary:  Positive for difficulty urinating, dysuria, frequency and urgency. Negative for flank pain.  Psychiatric/Behavioral:  The patient is nervous/anxious.    Physical Exam Vitals and nursing note reviewed.  Constitutional:      General: She is not in acute distress.    Appearance: She is well-developed. She is not diaphoretic.  HENT:     Head: Normocephalic and atraumatic.     Mouth/Throat:     Pharynx: No oropharyngeal exudate.  Eyes:     Pupils: Pupils are equal, round, and reactive  to light.  Neck:     Thyroid: No thyromegaly.     Vascular: No JVD.     Trachea: No tracheal deviation.  Cardiovascular:     Rate and Rhythm: Normal rate and regular rhythm.     Heart sounds: Normal heart sounds. No murmur heard.   No friction rub. No gallop.  Pulmonary:     Effort: Pulmonary effort is normal. No respiratory distress.     Breath sounds: No wheezing or rales.  Chest:     Chest wall: No tenderness.  Abdominal:     General: Bowel sounds are normal. There is no distension.     Palpations: Abdomen is soft.     Tenderness: There is abdominal  tenderness.  Musculoskeletal:        General: Normal range of motion.     Cervical back: Normal range of motion and neck supple.  Lymphadenopathy:     Cervical: No cervical adenopathy.  Skin:    General: Skin is warm and dry.  Neurological:     Mental Status: She is alert and oriented to person, place, and time.     Cranial Nerves: No cranial nerve deficit.  Psychiatric:        Thought Content: Thought content normal.        Judgment: Judgment normal.     Comments: Tearful in office      Assessment/Plan: 1. Dysuria Will start cipro and adjust based on C/S. Will also treat for yeast infection as well - POCT Urinalysis Dipstick - fluconazole (DIFLUCAN) 150 MG tablet; Take 1 tablet (150 mg total) by mouth once for 1 dose.  Dispense: 3 tablet; Refill: 0 - ciprofloxacin (CIPRO) 500 MG tablet; Take 1 tablet (500 mg total) by mouth 2 (two) times daily for 10 days.  Dispense: 20 tablet; Refill: 0 - CULTURE, URINE COMPREHENSIVE  2. Nausea May take zofran as needed  3. Abdominal bloating with cramps workup thus far has not found any acute abnormalities, followed by GI with upcoming colonoscopy, if acute worsening advised to go back to ED. Advised to also increase fluid intake and trial bland diet    General Counseling: Tina Rodgers understanding of the findings of todays visit and agrees with plan of treatment. I have discussed any further diagnostic evaluation that may be needed or ordered today. We also reviewed her medications today. she has been encouraged to call the office with any questions or concerns that should arise related to todays visit.    Counseling:    Orders Placed This Encounter  Procedures   CULTURE, URINE COMPREHENSIVE   POCT Urinalysis Dipstick    Meds ordered this encounter  Medications   fluconazole (DIFLUCAN) 150 MG tablet    Sig: Take 1 tablet (150 mg total) by mouth once for 1 dose.    Dispense:  3 tablet    Refill:  0   ciprofloxacin  (CIPRO) 500 MG tablet    Sig: Take 1 tablet (500 mg total) by mouth 2 (two) times daily for 10 days.    Dispense:  20 tablet    Refill:  0   ondansetron (ZOFRAN) 4 MG tablet    Sig: Take 1 tablet (4 mg total) by mouth every 8 (eight) hours as needed for nausea or vomiting.    Dispense:  20 tablet    Refill:  0    Time spent:30 Minutes

## 2022-04-20 ENCOUNTER — Ambulatory Visit: Payer: BC Managed Care – PPO

## 2022-04-20 ENCOUNTER — Telehealth: Payer: Self-pay

## 2022-04-20 NOTE — Telephone Encounter (Signed)
Pt called she is having constipation and she already try metamucil is not working as per lauren advised her call her GI for further treatment due pt already seen by GI

## 2022-04-21 ENCOUNTER — Ambulatory Visit: Payer: BC Managed Care – PPO

## 2022-04-23 ENCOUNTER — Telehealth: Payer: Self-pay | Admitting: Gastroenterology

## 2022-04-23 NOTE — Telephone Encounter (Signed)
Patient left vm requesting a call back from nurse about upcoming procedure.

## 2022-04-23 NOTE — Telephone Encounter (Signed)
Patient is wanting to know if she needs to take her Cipro the morning of her procedure informed her no not till after her procedure. Patient is wanting to know if she is put to sleep. Informed her yes she will be put to sleep

## 2022-04-24 ENCOUNTER — Encounter: Payer: Self-pay | Admitting: Gastroenterology

## 2022-04-24 ENCOUNTER — Telehealth: Payer: Self-pay

## 2022-04-24 ENCOUNTER — Encounter: Payer: Self-pay | Admitting: Physician Assistant

## 2022-04-24 LAB — CULTURE, URINE COMPREHENSIVE

## 2022-04-24 NOTE — Telephone Encounter (Signed)
Patient is wanting to know if she can start her bowel prep any earlier then 5:00pm Informed her she could at 4:30pm but not any earlier

## 2022-04-24 NOTE — Telephone Encounter (Signed)
Sent pt back a mychart message about UA Culture and advised there was no growth.

## 2022-04-24 NOTE — Telephone Encounter (Signed)
-----   Message from Carlean Jews, PA-C sent at 04/24/2022  1:26 PM EDT ----- Please let her know that there was no growth on urine culture

## 2022-04-25 ENCOUNTER — Ambulatory Visit: Payer: BC Managed Care – PPO | Admitting: Anesthesiology

## 2022-04-25 ENCOUNTER — Encounter
Admission: RE | Disposition: A | Discharge status: Home / Self Care | Payer: Self-pay | Source: Home / Self Care | Attending: Gastroenterology

## 2022-04-25 ENCOUNTER — Other Ambulatory Visit: Payer: Self-pay | Admitting: Gastroenterology

## 2022-04-25 ENCOUNTER — Ambulatory Visit
Admission: RE | Admit: 2022-04-25 | Discharge: 2022-04-25 | Disposition: A | Discharge status: Home / Self Care | Payer: BC Managed Care – PPO | Attending: Gastroenterology | Admitting: Gastroenterology

## 2022-04-25 ENCOUNTER — Encounter: Payer: Self-pay | Admitting: Gastroenterology

## 2022-04-25 DIAGNOSIS — R197 Diarrhea, unspecified: Secondary | ICD-10-CM | POA: Diagnosis not present

## 2022-04-25 DIAGNOSIS — R195 Other fecal abnormalities: Secondary | ICD-10-CM

## 2022-04-25 DIAGNOSIS — R1030 Lower abdominal pain, unspecified: Secondary | ICD-10-CM | POA: Diagnosis present

## 2022-04-25 DIAGNOSIS — R140 Abdominal distension (gaseous): Secondary | ICD-10-CM | POA: Diagnosis not present

## 2022-04-25 DIAGNOSIS — R110 Nausea: Secondary | ICD-10-CM | POA: Diagnosis not present

## 2022-04-25 DIAGNOSIS — K625 Hemorrhage of anus and rectum: Secondary | ICD-10-CM | POA: Diagnosis not present

## 2022-04-25 HISTORY — PX: COLONOSCOPY WITH PROPOFOL: SHX5780

## 2022-04-25 LAB — POCT PREGNANCY, URINE: Preg Test, Ur: NEGATIVE

## 2022-04-25 SURGERY — COLONOSCOPY WITH PROPOFOL
Anesthesia: General

## 2022-04-25 MED ORDER — PROPOFOL 500 MG/50ML IV EMUL
INTRAVENOUS | Status: DC | PRN
  Administered 2022-04-25: 140 ug/kg/min via INTRAVENOUS

## 2022-04-25 MED ORDER — DEXMEDETOMIDINE (PRECEDEX) IN NS 20 MCG/5ML (4 MCG/ML) IV SYRINGE
PREFILLED_SYRINGE | INTRAVENOUS | Status: DC | PRN
  Administered 2022-04-25: 12 ug via INTRAVENOUS

## 2022-04-25 MED ORDER — SODIUM CHLORIDE 0.9 % IV SOLN
INTRAVENOUS | Status: DC
  Administered 2022-04-25: 1000 mL via INTRAVENOUS

## 2022-04-25 MED ORDER — PROPOFOL 10 MG/ML IV BOLUS
INTRAVENOUS | Status: DC | PRN
  Administered 2022-04-25: 70 mg via INTRAVENOUS

## 2022-04-25 MED ORDER — LIDOCAINE HCL (CARDIAC) PF 100 MG/5ML IV SOSY
PREFILLED_SYRINGE | INTRAVENOUS | Status: DC | PRN
  Administered 2022-04-25: 60 mg via INTRAVENOUS

## 2022-04-25 NOTE — Anesthesia Preprocedure Evaluation (Signed)
Anesthesia Evaluation  Patient identified by MRN, date of birth, ID band Patient awake    Reviewed: Allergy & Precautions, NPO status , Patient's Chart, lab work & pertinent test results  History of Anesthesia Complications Negative for: history of anesthetic complications  Airway Mallampati: III   Neck ROM: Full    Dental no notable dental hx.    Pulmonary neg pulmonary ROS,    Pulmonary exam normal breath sounds clear to auscultation       Cardiovascular Exercise Tolerance: Good negative cardio ROS Normal cardiovascular exam Rhythm:Regular Rate:Normal     Neuro/Psych PSYCHIATRIC DISORDERS (ADD) negative neurological ROS     GI/Hepatic GERD  ,  Endo/Other  negative endocrine ROS  Renal/GU negative Renal ROS     Musculoskeletal   Abdominal   Peds  Hematology negative hematology ROS (+)   Anesthesia Other Findings   Reproductive/Obstetrics                             Anesthesia Physical Anesthesia Plan  ASA: 2  Anesthesia Plan: General   Post-op Pain Management:    Induction: Intravenous  PONV Risk Score and Plan: 3 and Propofol infusion, TIVA and Treatment may vary due to age or medical condition  Airway Management Planned: Natural Airway  Additional Equipment:   Intra-op Plan:   Post-operative Plan:   Informed Consent: I have reviewed the patients History and Physical, chart, labs and discussed the procedure including the risks, benefits and alternatives for the proposed anesthesia with the patient or authorized representative who has indicated his/her understanding and acceptance.       Plan Discussed with: CRNA  Anesthesia Plan Comments: (LMA/GETA backup discussed.  Patient consented for risks of anesthesia including but not limited to:  - adverse reactions to medications - damage to eyes, teeth, lips or other oral mucosa - nerve damage due to positioning  -  sore throat or hoarseness - damage to heart, brain, nerves, lungs, other parts of body or loss of life  Informed patient about role of CRNA in peri- and intra-operative care.  Patient voiced understanding.)        Anesthesia Quick Evaluation

## 2022-04-25 NOTE — Op Note (Signed)
[facility] Gastroenterology Patient Name: Tina HoarKathryn Byers Procedure Date: 04/25/2022 10:01 AM MRN: [id number] Account #: [id number] Date of Birth: [birth year] Admit Type: Outpatient Age: 31 [facility]OM 1 Gender: Female Note Status: Finalized Instrument Name: Prentice DockerColonoscope [id number] Procedure:             Colonoscopy Indications:           Lower abdominal pain, Clinically significant diarrhea                         of unexplained origin Providers:             Toney Reilohini Reddy Jamesia Linnen MD, MD Referring MD:          Lyndon CodeFozia M. Khan, MD (Referring MD) Medicines:             General Anesthesia Complications:         No immediate complications. Estimated blood loss: None. Procedure:             Pre-Anesthesia Assessment:                        - Prior to the procedure, a History and Physical was                         performed, and patient medications and allergies were                         reviewed. The patient is competent. The risks and                         benefits of the procedure and the sedation options and                         risks were discussed with the patient. All questions                         were answered and informed consent was obtained.                         Patient identification and proposed procedure were                         verified by the physician, the nurse, the                         anesthesiologist, the anesthetist and the technician                         in the pre-procedure area in the procedure room in the                         endoscopy suite. Mental Status Examination: alert and                         oriented. Airway Examination: normal oropharyngeal                         airway and neck mobility. Respiratory Examination:  clear to auscultation. CV Examination: normal.                         Prophylactic Antibiotics: The patient does not require                         prophylactic  antibiotics. Prior Anticoagulants: The                         patient has taken no previous anticoagulant or                         antiplatelet agents. ASA Grade Assessment: II - A                         patient with mild systemic disease. After reviewing                         the risks and benefits, the patient was deemed in                         satisfactory condition to undergo the procedure. The                         anesthesia plan was to use general anesthesia.                         Immediately prior to administration of medications,                         the patient was re-assessed for adequacy to receive                         sedatives. The heart rate, respiratory rate, oxygen                         saturations, blood pressure, adequacy of pulmonary                         ventilation, and response to care were monitored                         throughout the procedure. The physical status of the                         patient was re-assessed after the procedure.                        After obtaining informed consent, the colonoscope was                         passed under direct vision. Throughout the procedure,                         the patient's blood pressure, pulse, and oxygen                         saturations were monitored continuously. The  Colonoscope was introduced through the anus and                         advanced to the 10 cm into the ileum. The colonoscopy                         was performed without difficulty. The patient                         tolerated the procedure well. The quality of the bowel                         preparation was evaluated using the BBPS [facility] Bowel                         Preparation Scale) with scores of: Right Colon = 3,                         Transverse Colon = 3 and Left Colon = 3 (entire mucosa                         seen well with no residual staining, small fragments                          of stool or opaque liquid). The total BBPS score                         equals 9. Findings:      The perianal and digital rectal examinations were normal. Pertinent       negatives include normal sphincter tone and no palpable rectal lesions.      The terminal ileum appeared normal.      The entire examined colon appeared normal.      The retroflexed view of the distal rectum and anal verge was normal and       showed no anal or rectal abnormalities. Impression:            - The examined portion of the ileum was normal.                        - The entire examined colon is normal.                        - The distal rectum and anal verge are normal on                         retroflexion view.                        - No specimens collected. Recommendation:        - Discharge patient to home (with escort).                        - Resume regular diet today.                        - Continue present medications.                        -  Return to my office PRN. Procedure Code(s):     --- Professional ---                        [phone], Colonoscopy, flexible; diagnostic, including                         collection of specimen(s) by brushing or washing, when                         performed (separate procedure) Diagnosis Code(s):     --- Professional ---                        R10.30, Lower abdominal pain, unspecified                        R19.7, Diarrhea, unspecified CPT copyright 2019 American Medical Association. All rights reserved. The codes documented in this report are preliminary and upon coder review may  be revised to meet current compliance requirements. Dr. Libby Maw Toney Reil MD, MD 04/25/2022 10:41:51 AM This report has been signed electronically. Number of Addenda: 0 Note Initiated On: 04/25/2022 10:01 AM Scope Withdrawal Time: 0 hours 6 minutes 47 seconds  Total Procedure Duration: 0 hours 9 minutes 45 seconds  Estimated Blood Loss:  Estimated  blood loss: none.      [facility]

## 2022-04-25 NOTE — H&P (Signed)
Arlyss Repress, MD [address]  [address]  [place], [place] [address]  Main: [phone]  Fax: [phone] Pager: [phone]  Primary Care Physician:  Lyndon Code, MD Primary Gastroenterologist:  Dr. Arlyss Repress  Pre-Procedure History & Physical: HPI:  Tina Rodgers is a 31 y.o. female is here for an colonoscopy.   Past Medical History:  Diagnosis Date   ADD (attention deficit disorder)    Vitamin D deficiency     Past Surgical History:  Procedure Laterality Date   BREAST SURGERY     2 lumps removed- neg    Prior to Admission medications   Medication Sig Start Date End Date Taking? Authorizing Provider  cholecalciferol (VITAMIN D3) 25 MCG (1000 UNIT) tablet Take 1,000 Units by mouth daily.    [provider]  ciprofloxacin (CIPRO) 500 MG tablet Take 1 tablet (500 mg total) by mouth 2 (two) times daily for 10 days. 04/19/22 04/29/22  McDonough, Salomon Fick, PA-C  dicyclomine (BENTYL) 20 MG tablet Take 1 tablet (20 mg total) by mouth every 6 (six) hours. 04/10/22   Sallyanne Kuster, NP  fluticasone (FLONASE) 50 MCG/ACT nasal spray Place 2 sprays into both nostrils daily. 01/12/22   [provider]  metroNIDAZOLE (FLAGYL) 500 MG tablet Take 1 tablet (500 mg total) by mouth 2 (two) times daily. 04/10/22   Glenetta Borg, CNM  Multiple Vitamins-Minerals (ONE-A-DAY WOMENS PO) Take by mouth.    [provider]  ondansetron (ZOFRAN) 4 MG tablet Take 1 tablet (4 mg total) by mouth every 8 (eight) hours as needed for nausea or vomiting. 04/19/22   McDonough, Salomon Fick, PA-C  polyethylene glycol (MIRALAX) 17 g packet Take 17 g by mouth daily. 04/07/22   Poggi, Herb Grays, PA-C  sertraline (ZOLOFT) 50 MG tablet Take 1 tablet (50 mg total) by mouth daily. 11/23/21   McDonough, Salomon Fick, PA-C    Allergies as of 04/16/2022 - Review Complete 04/11/2022  Allergen Reaction Noted   Dust mite mixed allergen ext [mite (d. farinae)]  02/19/2022   Sulfa  antibiotics Other (See Comments)     Family History  Problem Relation Age of Onset   Ovarian cancer Paternal Grandmother    Atrial fibrillation Father    Breast cancer Neg Hx    Colon cancer Neg Hx    Diabetes Neg Hx     Social History   Socioeconomic History   Marital status: Single    Spouse name: Not on file   Number of children: Not on file   Years of education: Not on file   Highest education level: Not on file  Occupational History   Not on file  Tobacco Use   Smoking status: Never   Smokeless tobacco: Never  Vaping Use   Vaping Use: Never used  Substance and Sexual Activity   Alcohol use: Yes    Comment: ocassionally   Drug use: No   Sexual activity: Yes    Birth control/protection: Condom  Other Topics Concern   Not on file  Social History Narrative   Not on file   Social Determinants of Health   Financial Resource Strain: Not on file  Food Insecurity: Not on file  Transportation Needs: Not on file  Physical Activity: Not on file  Stress: Not on file  Social Connections: Not on file  Intimate Partner Violence: Not on file    Review of Systems: See HPI, otherwise negative ROS  Physical Exam: BP 123/78   Pulse  95   Temp (!) 96.9 F (36.1 C) (Temporal)   Resp 16   Ht 5' (1.524 m)   Wt 63.6 kg   LMP 03/31/2022 (Exact Date) Comment: pregnancy test negative  SpO2 100%   BMI 27.40 kg/m  General:   Alert,  pleasant and cooperative in NAD Head:  Normocephalic and atraumatic. Neck:  Supple; no masses or thyromegaly. Lungs:  Clear throughout to auscultation.    Heart:  Regular rate and rhythm. Abdomen:  Soft, nontender and nondistended. Normal bowel sounds, without guarding, and without rebound.   Neurologic:  Alert and  oriented x4;  grossly normal neurologically.  Impression/Plan: Tina Rodgers is here for an colonoscopy to be performed for loose stools, right lower quadrant discomfort associated with subjective fever, nausea,  abdominal bloating  Risks, benefits, limitations, and alternatives regarding  colonoscopy have been reviewed with the patient.  Questions have been answered.  All parties agreeable.   Lannette Donath, MD  04/25/2022, 10:07 AM

## 2022-04-25 NOTE — Anesthesia Postprocedure Evaluation (Signed)
Anesthesia Post Note  Patient: Tina Rodgers  Procedure(s) Performed: COLONOSCOPY WITH PROPOFOL  Patient location during evaluation: PACU Anesthesia Type: General Level of consciousness: awake and alert, oriented and patient cooperative Pain management: pain level controlled Vital Signs Assessment: post-procedure vital signs reviewed and stable Respiratory status: spontaneous breathing, nonlabored ventilation and respiratory function stable Cardiovascular status: blood pressure returned to baseline and stable Postop Assessment: adequate PO intake Anesthetic complications: no   No notable events documented.   Last Vitals:  Vitals:   04/25/22 1045 04/25/22 1050  BP: (!) 89/57 93/62  Pulse: 72 68  Resp: 19 (!) 25  Temp:    SpO2: 95% 97%    Last Pain:  Vitals:   04/25/22 1040  TempSrc: Temporal  PainSc:                  Darrin Nipper

## 2022-04-25 NOTE — Transfer of Care (Signed)
Immediate Anesthesia Transfer of Care Note  Patient: Tina Rodgers  Procedure(s) Performed: COLONOSCOPY WITH PROPOFOL  Patient Location: PACU  Anesthesia Type:General  Level of Consciousness: awake, alert  and oriented  Airway & Oxygen Therapy: Patient Spontanous Breathing  Post-op Assessment: Report given to RN and Post -op Vital signs reviewed and stable  Post vital signs: Reviewed and stable  Last Vitals:  Vitals Value Taken Time  BP    Temp    Pulse 72 04/25/22 1045  Resp 19 04/25/22 1045  SpO2 95 % 04/25/22 1045  Vitals shown include unvalidated device data.  Last Pain:  Vitals:   04/25/22 1040  TempSrc: Temporal  PainSc:          Complications: No notable events documented.

## 2022-04-27 ENCOUNTER — Other Ambulatory Visit: Payer: Self-pay | Admitting: Physician Assistant

## 2022-04-27 ENCOUNTER — Telehealth: Payer: Self-pay | Admitting: Certified Nurse Midwife

## 2022-04-27 DIAGNOSIS — R39198 Other difficulties with micturition: Secondary | ICD-10-CM

## 2022-04-27 DIAGNOSIS — R102 Pelvic and perineal pain: Secondary | ICD-10-CM

## 2022-04-27 LAB — CELIAC DISEASE PANEL
Endomysial Ab, IgA: NEGATIVE
IgA: 129 mg/dL (ref 87–352)
Tissue Transglutaminase Ab, IgA: <2 U/mL (ref 0–3)

## 2022-04-27 NOTE — Telephone Encounter (Signed)
Pt states " she is currently being treated for a UTI infection that  antibiotics have not resolved. She is experiencing: pressure, burning, and lower stomach pains. Pt states she is concerned she has cervical cancer . Pt requested being seen today, I advised the pt - per Bri to seek care at urgent care as any testing done in office would not come back until after the holiday weekend. Pt verbalized she understood . Pt will call back Tuesday with an update . Pt next appt is 06/08 with Tina Rodgers

## 2022-05-01 ENCOUNTER — Other Ambulatory Visit: Payer: Self-pay | Admitting: Internal Medicine

## 2022-05-01 ENCOUNTER — Other Ambulatory Visit: Payer: Self-pay

## 2022-05-01 MED ORDER — AMITRIPTYLINE HCL 10 MG PO TABS: 10.0000 mg | ORAL_TABLET | Freq: Every day | ORAL | 3 refills | Status: DC

## 2022-05-01 MED ORDER — PHENAZOPYRIDINE HCL 100 MG PO TABS: 100.0000 mg | ORAL_TABLET | Freq: Three times a day (TID) | ORAL | 0 refills | Status: DC | PRN

## 2022-05-01 NOTE — Telephone Encounter (Signed)
Pt called and advised she was having urinary retention for the last few days and also was requesting a lab order for a CMP due to her last lab showing abnormalities.  Per Dr Welton Flakes pt doesn't need another lab order for CMP at moment.  DFK ordered pyridium and sent rx to pharmacy and also Elavil 10 mg to help with sleep.  Pt was advised that both will be sent to pharmacy and that questions for further lab orders she can discuss at her next appt visit with lauren.  We also informed pt that she can take cranberry tablets to help with the urinary retention.   Pt was also questioning her referral to urology and advising that she was in a lot of pain and I informed pt that if she can't wait til urology calls to schedule appt that she can go to ER.

## 2022-05-02 ENCOUNTER — Encounter: Payer: Self-pay | Admitting: Gastroenterology

## 2022-05-02 ENCOUNTER — Other Ambulatory Visit: Payer: Self-pay | Admitting: Nurse Practitioner

## 2022-05-02 DIAGNOSIS — R197 Diarrhea, unspecified: Secondary | ICD-10-CM

## 2022-05-02 DIAGNOSIS — R109 Unspecified abdominal pain: Secondary | ICD-10-CM

## 2022-05-03 ENCOUNTER — Other Ambulatory Visit (HOSPITAL_COMMUNITY)
Admission: RE | Admit: 2022-05-03 | Discharge: 2022-05-03 | Disposition: A | Discharge status: Home / Self Care | Payer: BC Managed Care – PPO | Source: Ambulatory Visit | Attending: Obstetrics | Admitting: Obstetrics

## 2022-05-03 ENCOUNTER — Ambulatory Visit: Payer: BC Managed Care – PPO | Admitting: Obstetrics

## 2022-05-03 ENCOUNTER — Other Ambulatory Visit: Payer: Self-pay | Admitting: Obstetrics

## 2022-05-03 VITALS — BP 116/72 | HR 91 | Ht 60.0 in | Wt 140.7 lb

## 2022-05-03 DIAGNOSIS — Z124 Encounter for screening for malignant neoplasm of cervix: Secondary | ICD-10-CM | POA: Diagnosis present

## 2022-05-03 DIAGNOSIS — R102 Pelvic and perineal pain: Secondary | ICD-10-CM

## 2022-05-03 LAB — POCT URINALYSIS DIPSTICK
Bilirubin, UA: NEGATIVE
Glucose, UA: NEGATIVE
Ketones, UA: NEGATIVE
Leukocytes, UA: NEGATIVE
Nitrite, UA: NEGATIVE
Protein, UA: NEGATIVE
Spec Grav, UA: <=1.005 — AB (ref 1.010–1.025)
Urobilinogen, UA: 0.2 E.U./dL
pH, UA: 7 (ref 5.0–8.0)

## 2022-05-03 NOTE — Progress Notes (Signed)
GYN ENCOUNTER  Encounter for Pelvic Pain  Subjective  HPI: Tina Rodgers is a 31 y.o. G1P0101 who presents today for continued pelvic pain and bloating. She was recently treated for BV. Those symptoms have improved, but she has continued to experience lower abdominal bloating and pain for about a month. She states that the bloating gets worse around the time her period is due. She had a colonoscopy which was normal. Pelvic US and CT were both normal. She denies fever, vomiting, diarrhea, and constipation. She does report recurrent headaches that are resolved by Tylenol. She denies swollen lymph nodes. She is having spotting and her period is due. She reports having had several negative pregnancy tests, and she has had intercourse only once since her last period, but stopped because it was too painful.  She reports sometimes being constipated and sometimes having multiple bowel movements in a day. She was prescribed Bentyl for possible IBS, but does not tolerate the side effects. She is concerned about ovarian cancer since her paternal grandmother died in her 30s from this. She would also like a Pap smear.  Past Medical History:  Diagnosis Date   ADD (attention deficit disorder)    Vitamin D deficiency    Past Surgical History:  Procedure Laterality Date   BREAST SURGERY     2 lumps removed- neg   COLONOSCOPY WITH PROPOFOL N/A 04/25/2022   Procedure: COLONOSCOPY WITH PROPOFOL;  Surgeon: Toney Reil, MD;  Location: [facility] ENDOSCOPY;  Service: Gastroenterology;  Laterality: N/A;   OB History     Gravida  1   Para  1   Term      Preterm  1   AB      Living  1      SAB      IAB      Ectopic      Multiple  0   Live Births  1          Allergies  Allergen Reactions   Dust Mite Mixed Allergen Ext [Mite (D. Farinae)]    Sulfa Antibiotics Other (See Comments)    flush    Negative except as noted in HPI History obtained from the patient  Objective  BP  116/72   Pulse 91   Ht 5' (1.524 m)   Wt 140 lb 11.2 oz (63.8 kg)   LMP 04/30/2022 (Exact Date)   BMI 27.48 kg/m   General appearance: alert, cooperative,tearful Head: Normocephalic, without obvious abnormality Neck: supple, symmetrical, trachea midline, no adenopathy, and thyroid: not enlarged, symmetric, no tenderness/mass/nodules Lungs: clear to auscultation bilaterally Heart: regular rate and rhythm, S1, S2 normal, no murmur, click, rub or gallop Abdomen: soft, normal bowel sounds, no organomegaly. Painful to palpation on lower right quadrant. Small mass palpated, possible hernia.  Pelvic: External genitalia normal, Vagina normal without discharge, cervix normal in appearance, no CMT, tenderness on right side with bimanual exam. Pap and swabs collected. Lymph nodes: Cervical, supraclavicular, and axillary nodes normal.  Assessment 1) Pelvic/abdominal pain, possible hernia  Plan 1) Follow up on swabs and Pap smear 2) Referral for evaluation of possible hernia 3) Discussed normal imaging and no ovarian or other masses found   [name], CNM

## 2022-05-05 LAB — IGE FOOD W/COMPONENT REFLEX II
Allergen Corn, IgE: <0.1 kU/L
Clam IgE: <0.1 kU/L
Codfish IgE: <0.1 kU/L
F001-IgE Egg White: <0.1 kU/L
F002-IgE Milk: <0.1 kU/L
F017-IgE Hazelnut (Filbert): <0.1 kU/L
F018-IgE Brazil Nut: <0.1 kU/L
F020-IgE Almond: <0.1 kU/L
F202-IgE Cashew Nut: <0.1 kU/L
F203-IgE Pistachio Nut: <0.1 kU/L
F256-IgE Walnut: <0.1 kU/L
Macadamia Nut, IgE: <0.1 kU/L
Peanut, IgE: <0.1 kU/L
Pecan Nut IgE: <0.1 kU/L
Scallop IgE: <0.1 kU/L
Sesame Seed IgE: <0.1 kU/L
Shrimp IgE: <0.1 kU/L
Soybean IgE: <0.1 kU/L
Wheat IgE: <0.1 kU/L

## 2022-05-05 LAB — CELIAC DISEASE PANEL
Endomysial IgA: NEGATIVE
IgA/Immunoglobulin A, Serum: 135 mg/dL (ref 87–352)
Transglutaminase IgA: <2 U/mL (ref 0–3)

## 2022-05-05 LAB — URINE CULTURE

## 2022-05-08 ENCOUNTER — Telehealth: Payer: Self-pay | Admitting: Obstetrics

## 2022-05-08 NOTE — Telephone Encounter (Signed)
Pt would like the results of her resent pap smear  from 05/03/22 discussed. Pt is asking for a call back.

## 2022-05-10 ENCOUNTER — Encounter: Payer: Self-pay | Admitting: Obstetrics

## 2022-05-10 ENCOUNTER — Encounter: Payer: Self-pay | Admitting: Gastroenterology

## 2022-05-10 ENCOUNTER — Other Ambulatory Visit: Payer: Self-pay | Admitting: Obstetrics

## 2022-05-10 ENCOUNTER — Ambulatory Visit: Payer: BC Managed Care – PPO | Admitting: Urology

## 2022-05-10 ENCOUNTER — Encounter: Payer: BC Managed Care – PPO | Admitting: Obstetrics

## 2022-05-10 VITALS — BP 98/68 | HR 103 | Ht 60.0 in | Wt 138.0 lb

## 2022-05-10 DIAGNOSIS — R350 Frequency of micturition: Secondary | ICD-10-CM

## 2022-05-10 DIAGNOSIS — R399 Unspecified symptoms and signs involving the genitourinary system: Secondary | ICD-10-CM

## 2022-05-10 DIAGNOSIS — R3915 Urgency of urination: Secondary | ICD-10-CM | POA: Diagnosis not present

## 2022-05-10 DIAGNOSIS — N3289 Other specified disorders of bladder: Secondary | ICD-10-CM

## 2022-05-10 DIAGNOSIS — R300 Dysuria: Secondary | ICD-10-CM

## 2022-05-10 LAB — URINALYSIS, COMPLETE
Bilirubin, UA: NEGATIVE
Glucose, UA: NEGATIVE
Ketones, UA: NEGATIVE
Nitrite, UA: NEGATIVE
Protein,UA: NEGATIVE
RBC, UA: NEGATIVE
Specific Gravity, UA: 1.02 (ref 1.005–1.030)
Urobilinogen, Ur: 0.2 mg/dL (ref 0.2–1.0)
pH, UA: 8.5 — ABNORMAL HIGH (ref 5.0–7.5)

## 2022-05-10 LAB — CERVICOVAGINAL ANCILLARY ONLY
Bacterial Vaginitis (gardnerella): POSITIVE — AB
Candida Glabrata: NEGATIVE
Candida Vaginitis: POSITIVE — AB
Chlamydia: NEGATIVE
Comment: NEGATIVE
Comment: NEGATIVE
Comment: NEGATIVE
Comment: NEGATIVE
Comment: NEGATIVE
Comment: NORMAL
Neisseria Gonorrhea: NEGATIVE
Trichomonas: NEGATIVE

## 2022-05-10 LAB — CYTOLOGY - PAP
Comment: NEGATIVE
Diagnosis: NEGATIVE
High risk HPV: NEGATIVE

## 2022-05-10 LAB — MICROSCOPIC EXAMINATION

## 2022-05-10 MED ORDER — FLUCONAZOLE 150 MG PO TABS: 150.0000 mg | ORAL_TABLET | Freq: Once | ORAL | 0 refills | Status: AC

## 2022-05-10 MED ORDER — METRONIDAZOLE 0.75 % VA GEL: 1.0000 | Freq: Every day | VAGINAL | 0 refills | Status: AC

## 2022-05-10 MED ORDER — GEMTESA 75 MG PO TABS: 75.0000 mg | ORAL_TABLET | Freq: Every day | ORAL | 0 refills | Status: DC

## 2022-05-10 NOTE — Progress Notes (Unsigned)
05/10/2022 8:58 AM   Tina Rodgers 03/25/1991 [id number]  Referring provider: Lyndon Code, MD [address] [place],  [place] [address]  Chief Complaint  Patient presents with   Dysuria    HPI: Tina Rodgers is a 31 y.o. female referred for evaluation of dysuria.  Initially seen by her gynecologist 04/05/2022 with complaints of vulvar itching, burning and dysuria after completing antibiotics for an ear infection.  She was treated with phenazopyridine.  Urine culture grew mixed flora Presented to [facility] ED 04/07/2022 after being seen at an urgent care for left lower quadrant abdominal pain.  Did note some straining to urinate.  UA showed large blood on dipstick and 11-20 RBCs on microscopy though she was menstruating at the time of the collection CT abdomen pelvis with contrast was performed which showed no urinary tract calculi or other abnormalities.  Pelvic ultrasound showed no significant abnormalities.  It was felt her pain was most likely secondary to constipation and she was treated with MiraLAX Seen in follow-up 04/19/2022 complaining of dysuria, frequency, urgency, hesitancy and decreased urine stream.  Dip UA showed small leukocytes.  Microscopy was not ordered.  Urine culture subsequently was negative She was treated with a course of Cipro and fluconazole States her dysuria resolved though she has intermittent frequency, urgency and bladder pressure.  Notes some urine leakage with changes in position not related to urge 1 prior vaginal delivery 4 years ago Does note worsening symptoms with coffee Denies gross hematuria Was prescribed low-dose amitriptyline 10 mg for insomnia though she never started this medication  PMH: Past Medical History:  Diagnosis Date   ADD (attention deficit disorder)    Vitamin D deficiency     Surgical History: Past Surgical History:  Procedure Laterality Date   BREAST SURGERY     2 lumps removed- neg   COLONOSCOPY WITH  PROPOFOL N/A 04/25/2022   Procedure: COLONOSCOPY WITH PROPOFOL;  Surgeon: Toney Reil, MD;  Location: [facility] ENDOSCOPY;  Service: Gastroenterology;  Laterality: N/A;    Home Medications:  Allergies as of 05/10/2022       Reactions   Dust Mite Mixed Allergen Ext [mite (d. Farinae)]    Sulfa Antibiotics Other (See Comments)   flush        Medication List        Accurate as of May 10, 2022  8:58 AM. If you have any questions, ask your nurse or doctor.          amitriptyline 10 MG tablet Commonly known as: ELAVIL Take 1 tablet (10 mg total) by mouth at bedtime.   cholecalciferol 25 MCG (1000 UNIT) tablet Commonly known as: VITAMIN D3 Take 1,000 Units by mouth daily.   dicyclomine 20 MG tablet Commonly known as: BENTYL TAKE 1 TABLET BY MOUTH EVERY 6 HOURS.   fluticasone 50 MCG/ACT nasal spray Commonly known as: FLONASE Place 2 sprays into both nostrils daily.   ondansetron 4 MG tablet Commonly known as: Zofran Take 1 tablet (4 mg total) by mouth every 8 (eight) hours as needed for nausea or vomiting.   ONE-A-DAY WOMENS PO Take by mouth.   phenazopyridine 100 MG tablet Commonly known as: Pyridium Take 1 tablet (100 mg total) by mouth 3 (three) times daily as needed for pain.   polyethylene glycol 17 g packet Commonly known as: MiraLax Take 17 g by mouth daily.   sertraline 50 MG tablet Commonly known as: ZOLOFT Take 1 tablet (50 mg total) by mouth daily.  Allergies:  Allergies  Allergen Reactions   Dust Mite Mixed Allergen Ext [Mite (D. Farinae)]    Sulfa Antibiotics Other (See Comments)    flush    Family History: Family History  Problem Relation Age of Onset   Ovarian cancer Paternal Grandmother    Atrial fibrillation Father    Breast cancer Neg Hx    Colon cancer Neg Hx    Diabetes Neg Hx     Social History:  reports that she has never smoked. She has never used smokeless tobacco. She reports current alcohol use. She reports that  she does not use drugs.   Physical Exam: BP 98/68   Pulse (!) 103   Ht 5' (1.524 m)   Wt 138 lb (62.6 kg)   LMP 04/30/2022 (Exact Date)   BMI 26.95 kg/m   Constitutional:  Alert and oriented, No acute distress. HEENT: [place] AT Respiratory: Normal respiratory effort, no increased work of breathing. Psychiatric: Normal mood and affect.  Laboratory Data:  Urinalysis Dipstick 1+ leukocytes Microscopy 6-10 WBC   Pertinent Imaging: CT images were personally reviewed and interpreted ***   Assessment & Plan:   31 y.o. female with intermittent frequency, urgency, dysuria and bladder pressure Urine cultures negative x2; mild pyuria today's UA and urine culture repeated We discussed potential diagnoses including overactive bladder and interstitial cystitis She was given literature on IC smart diet Trial Gemtesa 75 mg daily-samples given Follow-up 1 month for symptom reassessment   Abbie Sons, MD  [facility] [address], [place], [place] [address] [phone]

## 2022-05-11 ENCOUNTER — Other Ambulatory Visit: Payer: Self-pay | Admitting: Obstetrics

## 2022-05-11 ENCOUNTER — Encounter: Payer: Self-pay | Admitting: Obstetrics

## 2022-05-11 ENCOUNTER — Encounter: Payer: Self-pay | Admitting: Urology

## 2022-05-11 ENCOUNTER — Telehealth: Payer: Self-pay

## 2022-05-11 NOTE — Progress Notes (Signed)
error 

## 2022-05-11 NOTE — Telephone Encounter (Signed)
Incoming call from pt who questions the results of her recent UA in office, she questions why the pH of her urine is so high and is continuing to rise and if this could be related to vaginal BV and yeast infections. Please advise.

## 2022-05-13 ENCOUNTER — Other Ambulatory Visit: Payer: Self-pay | Admitting: Nurse Practitioner

## 2022-05-13 DIAGNOSIS — R109 Unspecified abdominal pain: Secondary | ICD-10-CM

## 2022-05-13 LAB — CULTURE, URINE COMPREHENSIVE

## 2022-05-14 ENCOUNTER — Encounter: Payer: Self-pay | Admitting: Urology

## 2022-05-14 NOTE — Telephone Encounter (Signed)
Patient also sent a MyChart message and question was answered via MyChart

## 2022-05-15 ENCOUNTER — Encounter: Payer: Self-pay | Admitting: Surgery

## 2022-05-15 ENCOUNTER — Other Ambulatory Visit: Payer: Self-pay

## 2022-05-15 ENCOUNTER — Ambulatory Visit: Payer: BC Managed Care – PPO | Admitting: Surgery

## 2022-05-15 VITALS — BP 108/72 | HR 85 | Temp 97.9°F | Ht 60.0 in | Wt 139.6 lb

## 2022-05-15 DIAGNOSIS — R109 Unspecified abdominal pain: Secondary | ICD-10-CM

## 2022-05-15 DIAGNOSIS — R1031 Right lower quadrant pain: Secondary | ICD-10-CM | POA: Diagnosis not present

## 2022-05-15 MED ORDER — CYCLOBENZAPRINE HCL 5 MG PO TABS: 5.0000 mg | ORAL_TABLET | Freq: Three times a day (TID) | ORAL | 0 refills | Status: DC | PRN

## 2022-05-15 NOTE — Patient Instructions (Addendum)
   Ultrasound scheduled 05/25/22 @ 7:45 @ [facility]. Nothing to eat/drink after midnight.  MRI scheduled 05/25/22 @8 :45 am [facility].  Nothing to eat/drink after midnight.   Please see your follow up appointment listed below.        Advised to pursue a goal of 25 to 30 g of fiber daily.  Made aware that the majority of this may be through natural sources, but advised to be aware of actual consumption and to ensure minimal consumption by daily supplementation.  Various forms of supplements discussed.  Recommended Psyllium husk, that mixes well with applesauce, or the powder which goes down well shaken with chocolate milk.  Strongly advised to consume more fluids to ensure adequate hydration, instructed to watch color of urine to determine adequacy of hydration.  Clarity is pursued in urine output, and bowel activity that correlates to significant meal intake.   We need to avoid deferring having bowel movements, advised to take the time at the first sign of sensation, typically following meals, and in the morning.   Subsequent utilization of MiraLAX may be needed ensure at least daily movement, ideally twice daily bowel movements.  If multiple doses of MiraLAX are necessary utilize them. Never skip a day...  To be regular, we must do the above EVERY day.

## 2022-05-15 NOTE — Progress Notes (Signed)
[name], female   DOB: Jun 14, 1991, 31 y.o.   MRN: [id number]  Chief Complaint: Right abdominal pain  History of Present Illness Tina Rodgers is a 31 y.o. female with a 1-12/5635-month history of right-sided abdominal pain, described as a fullness or pressure.  Seemingly associated with bowel activity and pressure in the pelvic area.  This occurs on an off-and-on basis.  Dietary exacerbators include dairy, fried or fatty foods.  She reports positional exacerbators include bending, lifting.  She has noted slight increased in the abdominal pain, exacerbating her pain sometimes with buttoning her pants.  She did note that during a pelvic exam/Pap smear that she had an exacerbation of the pain at that time as well.  She has had a recent work-up which has also included a gastric emptying study.  Past Medical History Past Medical History:  Diagnosis Date   ADD (attention deficit disorder)    Vitamin D deficiency       Past Surgical History:  Procedure Laterality Date   BREAST SURGERY     2 lumps removed- neg   COLONOSCOPY WITH PROPOFOL N/A 04/25/2022   Procedure: COLONOSCOPY WITH PROPOFOL;  Surgeon: Toney ReilVanga, Rohini Reddy, MD;  Location: [facility] ENDOSCOPY;  Service: Gastroenterology;  Laterality: N/A;    Allergies  Allergen Reactions   Dust Mite Mixed Allergen Ext [Mite (D. Farinae)]    Sulfa Antibiotics Other (See Comments)    flush    Current Outpatient Medications  Medication Sig Dispense Refill   amitriptyline (ELAVIL) 10 MG tablet Take 1 tablet (10 mg total) by mouth at bedtime. 30 tablet 3   cholecalciferol (VITAMIN D3) 25 MCG (1000 UNIT) tablet Take 1,000 Units by mouth daily.     cyclobenzaprine (FLEXERIL) 5 MG tablet Take 1 tablet (5 mg total) by mouth 3 (three) times daily as needed for muscle spasms. 30 tablet 0   fluticasone (FLONASE) 50 MCG/ACT nasal spray Place 2 sprays into both nostrils daily.     metroNIDAZOLE (METROGEL VAGINAL) 0.75 % vaginal  gel Place 1 Applicatorful vaginally at bedtime for 5 doses. 50 g 0   Multiple Vitamins-Minerals (ONE-A-DAY WOMENS PO) Take by mouth.     ondansetron (ZOFRAN) 4 MG tablet Take 1 tablet (4 mg total) by mouth every 8 (eight) hours as needed for nausea or vomiting. 20 tablet 0   polyethylene glycol (MIRALAX) 17 g packet Take 17 g by mouth daily. 14 each 0   sertraline (ZOLOFT) 50 MG tablet Take 1 tablet (50 mg total) by mouth daily. 90 tablet 2   No current facility-administered medications for this visit.    Family History Family History  Problem Relation Age of Onset   Ovarian cancer Paternal Grandmother    Atrial fibrillation Father    Breast cancer Neg Hx    Colon cancer Neg Hx    Diabetes Neg Hx       Social History Social History   Tobacco Use   Smoking status: Never   Smokeless tobacco: Never  Vaping Use   Vaping Use: Never used  Substance Use Topics   Alcohol use: Yes    Comment: ocassionally   Drug use: No        Review of Systems  Constitutional:  Positive for malaise/fatigue.  HENT:  Positive for sore throat.   Eyes: Negative.   Respiratory:  Positive for shortness of breath.   Cardiovascular:  Positive for chest pain.  Gastrointestinal:  Positive for abdominal pain, constipation, heartburn and nausea. Negative for  blood in stool and melena.  Genitourinary:  Positive for dysuria, flank pain and urgency.  Skin:  Positive for itching.  Neurological:  Positive for headaches.  Psychiatric/Behavioral:  Positive for depression.       Physical Exam Blood pressure 108/72, pulse 85, temperature 97.9 F (36.6 C), temperature source Oral, height 5' (1.524 m), weight 139 lb 9.6 oz (63.3 kg), last menstrual period 04/30/2022, SpO2 100 %. Last Weight  Most recent update: 05/15/2022  3:28 PM    Weight  63.3 kg (139 lb 9.6 oz)             CONSTITUTIONAL: Well developed, and nourished, appropriately responsive and aware without distress.   EYES: Sclera non-icteric.    EARS, NOSE, MOUTH AND THROAT:  The oropharynx is clear. Oral mucosa is pink and moist.   Hearing is intact to voice.  NECK: Trachea is midline, and there is no jugular venous distension.  LYMPH NODES:  Lymph nodes in the neck are not enlarged. RESPIRATORY:  Lungs are clear, and breath sounds are equal bilaterally. Normal respiratory effort without pathologic use of accessory muscles. CARDIOVASCULAR: Heart is regular in rate and rhythm. GI: The abdomen is soft, nontender, and nondistended.  There is focal tenderness to the right side of the pubic area in the region of the inguinal ligament.  There is no appreciable bulge or change with Valsalva.  There were no palpable masses. I did not appreciate hepatosplenomegaly. There were normal bowel sounds.  Velna Hatchet present as chaperone. MUSCULOSKELETAL:  Symmetrical muscle tone appreciated in all four extremities.    SKIN: Skin turgor is normal. No pathologic skin lesions appreciated.  NEUROLOGIC:  Motor and sensation appear grossly normal.  Cranial nerves are grossly without defect. PSYCH:  Alert and oriented to person, place and time. Affect is appropriate for situation.  Data Reviewed I have personally reviewed what is currently available of the patient's imaging, recent labs and medical records.   Labs:     Latest Ref Rng & Units 04/07/2022    4:36 PM 05/16/2021   10:13 AM 07/15/2018    5:49 AM  CBC  WBC 4.0 - 10.5 K/uL 9.6  7.2  24.5   Hemoglobin 12.0 - 15.0 g/dL 93.7  34.2  9.5   Hematocrit 36.0 - 46.0 % 43.0  40.8  28.2   Platelets 150 - 400 K/uL 339  310  275       Latest Ref Rng & Units 04/07/2022    4:36 PM 05/16/2021   10:13 AM  CMP  Glucose 70 - 99 mg/dL 96  91   BUN 6 - 20 mg/dL 12  10   Creatinine 8.76 - 1.00 mg/dL 8.11  5.72   Sodium 620 - 145 mmol/L 138  139   Potassium 3.5 - 5.1 mmol/L 3.6  4.6   Chloride 98 - 111 mmol/L 103  102   CO2 22 - 32 mmol/L 27  25   Calcium 8.9 - 10.3 mg/dL 9.7  9.4   Total Protein 6.5 - 8.1 g/dL  8.6  7.3   Total Bilirubin 0.3 - 1.2 mg/dL 0.6  0.3   Alkaline Phos 38 - 126 U/L 68  73   AST 15 - 41 U/L 25  18   ALT 0 - 44 U/L 23  18       Imaging:  Within last 24 hrs: No results found.  Assessment    Right-sided pubic pain/inguinal tenderness. Fatty food intolerance with bowel habit irregularity.  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 Pelvic MRI to evaluate for other etiology of right-sided pubalgia, possible right femoral hernia. Prescription for Flexeril as this has been helpful with nighttime pain relief and aid in sleeping.  Advised to pursue a goal of 25 to 30 g of fiber daily.  Made aware that the majority of this may be through natural sources, but advised to be aware of actual consumption and to ensure minimal consumption by daily supplementation.  Various forms of supplements discussed.  Recommended Psyllium husk, that mixes well with applesauce, or the powder which goes down well shaken with chocolate milk.  Strongly advised to consume more fluids to ensure adequate hydration, instructed to watch color of urine to determine adequacy of hydration.  Clarity is pursued in urine output, and bowel activity that correlates to significant meal intake.   We need to avoid deferring having bowel movements, advised to take the time at the first sign of sensation, typically following meals, and in the morning.   Subsequent utilization of MiraLAX may be needed ensure at least daily movement, ideally twice daily bowel  movements.  If multiple doses of MiraLAX are necessary utilize them. Never skip a day...  To be regular, we must do the above EVERY day.   We considered avoidance of fatty foods as a trial basis. Follow-up after the above tests.  Face-to-face time spent with the patient and accompanying care providers(if present) was 45 minutes, with more than 50% of the time spent counseling, educating, and coordinating care of the patient.    These notes generated with voice recognition software. I apologize for typographical errors.  Campbell Lerner M.D., FACS 05/15/2022, 4:00 PM

## 2022-05-16 LAB — CALPROTECTIN, FECAL: Calprotectin, Fecal: 34 ug/g (ref 0–120)

## 2022-05-17 ENCOUNTER — Encounter: Payer: Self-pay | Admitting: Family Medicine

## 2022-05-17 ENCOUNTER — Encounter: Payer: Self-pay | Admitting: Gastroenterology

## 2022-05-22 ENCOUNTER — Telehealth (INDEPENDENT_AMBULATORY_CARE_PROVIDER_SITE_OTHER): Payer: BC Managed Care – PPO | Admitting: Internal Medicine

## 2022-05-22 ENCOUNTER — Encounter: Payer: Self-pay | Admitting: Internal Medicine

## 2022-05-22 VITALS — BP 112/64 | HR 93 | Temp 97.8°F | Resp 16

## 2022-05-22 DIAGNOSIS — J3089 Other allergic rhinitis: Secondary | ICD-10-CM

## 2022-05-22 DIAGNOSIS — J029 Acute pharyngitis, unspecified: Secondary | ICD-10-CM | POA: Diagnosis not present

## 2022-05-22 NOTE — Progress Notes (Signed)
[facility] [address] [place], [place] [address]  Internal MEDICINE  Telephone Visit  Patient Name: Tina Rodgers  [id number]  [id number]  Date of Service: 06/06/2022  I connected with the patient at [phone] by telephone and verified the patients identity using two identifiers.   I discussed the limitations, risks, security and privacy concerns of performing an evaluation and management service by telephone and the availability of in person appointments. I also discussed with the patient that there may be a patient responsible charge related to the service.  The patient expressed understanding and agrees to proceed.    Chief Complaint  Patient presents with   Telephone Assessment    [phone]   Telephone Screen   Sore Throat    Feels swollen - NEGATIVE Covid   Cough    HPI Patient is seen via televisit for complaints of having cough and sore throat for few days. She reports negative COVID test-she denies any exposure to any other viral illnesses including flu or infectious mononucleosis. She denies any fever or chills. Admits to having allergies and does use Flonase on a regular basis Patient has multiple appointments scheduled with multiple specialist for ongoing symptoms of stomach problems   Current Medication: Outpatient Encounter Medications as of 05/22/2022  Medication Sig   cholecalciferol (VITAMIN D3) 25 MCG (1000 UNIT) tablet Take 1,000 Units by mouth daily.   cyclobenzaprine (FLEXERIL) 5 MG tablet Take 1 tablet (5 mg total) by mouth 3 (three) times daily as needed for muscle spasms.   fluticasone (FLONASE) 50 MCG/ACT nasal spray Place 2 sprays into both nostrils daily.   Multiple Vitamins-Minerals (ONE-A-DAY WOMENS PO) Take by mouth.   ondansetron (ZOFRAN) 4 MG tablet Take 1 tablet (4 mg total) by mouth every 8 (eight) hours as needed for nausea or vomiting.   polyethylene glycol (MIRALAX) 17 g packet Take 17 g by mouth daily.   sertraline  (ZOLOFT) 50 MG tablet Take 1 tablet (50 mg total) by mouth daily.   [DISCONTINUED] amitriptyline (ELAVIL) 10 MG tablet Take 1 tablet (10 mg total) by mouth at bedtime.   No facility-administered encounter medications on file as of 05/22/2022.    Surgical History: Past Surgical History:  Procedure Laterality Date   BREAST SURGERY     2 lumps removed- neg   COLONOSCOPY WITH PROPOFOL N/A 04/25/2022   Procedure: COLONOSCOPY WITH PROPOFOL;  Surgeon: Toney Reil, MD;  Location: [facility] ENDOSCOPY;  Service: Gastroenterology;  Laterality: N/A;    Medical History: Past Medical History:  Diagnosis Date   ADD (attention deficit disorder)    Vitamin D deficiency     Family History: Family History  Problem Relation Age of Onset   Ovarian cancer Paternal Grandmother    Atrial fibrillation Father    Breast cancer Neg Hx    Colon cancer Neg Hx    Diabetes Neg Hx     Social History   Socioeconomic History   Marital status: Single    Spouse name: Not on file   Number of children: Not on file   Years of education: Not on file   Highest education level: Not on file  Occupational History   Not on file  Tobacco Use   Smoking status: Never   Smokeless tobacco: Never  Vaping Use   Vaping Use: Never used  Substance and Sexual Activity   Alcohol use: Yes    Comment: ocassionally   Drug use: No   Sexual activity: Yes    Birth  control/protection: Condom  Other Topics Concern   Not on file  Social History Narrative   Not on file   Social Determinants of Health   Financial Resource Strain: Not on file  Food Insecurity: Not on file  Transportation Needs: Not on file  Physical Activity: Not on file  Stress: Not on file  Social Connections: Not on file  Intimate Partner Violence: Not on file      Review of Systems  Constitutional:  Negative for fatigue and fever.  HENT:  Positive for postnasal drip and sore throat. Negative for congestion and mouth sores.   Respiratory:   Negative for cough.   Cardiovascular:  Negative for chest pain.  Genitourinary:  Negative for flank pain.  Psychiatric/Behavioral: Negative.      Vital Signs: BP 112/64   Pulse 93   Temp 97.8 F (36.6 C)   Resp 16   LMP 04/30/2022 (Exact Date)    Observation/Objective:  Looks comfortable in NAD   Assessment/Plan: 1. Sore throat Monospot test is ordered, reassurance given to the patient, instructed to monitor his symptoms - Mononucleosis screen  2. Non-seasonal allergic rhinitis due to other allergic trigger Encouraged to use Flonase and Claritin over-the-counter on a regular basis  General Counseling: emilly lavey understanding of the findings of today's phone visit and agrees with plan of treatment. I have discussed any further diagnostic evaluation that may be needed or ordered today. We also reviewed her medications today. she has been encouraged to call the office with any questions or concerns that should arise related to todays visit.    Orders Placed This Encounter  Procedures   Mononucleosis screen    No orders of the defined types were placed in this encounter.   Time spent:10 Minutes    Dr Lyndon Code Internal medicine

## 2022-05-24 ENCOUNTER — Encounter: Payer: Self-pay | Admitting: Physician Assistant

## 2022-05-24 ENCOUNTER — Ambulatory Visit (INDEPENDENT_AMBULATORY_CARE_PROVIDER_SITE_OTHER): Payer: BC Managed Care – PPO | Admitting: Physician Assistant

## 2022-05-24 VITALS — BP 116/70 | HR 92 | Temp 98.3°F | Resp 16 | Ht 60.0 in | Wt 140.0 lb

## 2022-05-24 DIAGNOSIS — R300 Dysuria: Secondary | ICD-10-CM

## 2022-05-24 DIAGNOSIS — B379 Candidiasis, unspecified: Secondary | ICD-10-CM | POA: Diagnosis not present

## 2022-05-24 DIAGNOSIS — J029 Acute pharyngitis, unspecified: Secondary | ICD-10-CM | POA: Diagnosis not present

## 2022-05-24 DIAGNOSIS — B9689 Other specified bacterial agents as the cause of diseases classified elsewhere: Secondary | ICD-10-CM

## 2022-05-24 DIAGNOSIS — N760 Acute vaginitis: Secondary | ICD-10-CM | POA: Diagnosis not present

## 2022-05-24 DIAGNOSIS — Z0001 Encounter for general adult medical examination with abnormal findings: Secondary | ICD-10-CM | POA: Diagnosis not present

## 2022-05-24 NOTE — Progress Notes (Signed)
[facility] [address] [place], [place] [address]  Internal MEDICINE  Office Visit Note  Patient Name: Tina Rodgers  [id number]  [id number]  Date of Service: 06/05/2022  Chief Complaint  Patient presents with   Annual Exam    Having recurrent BV/Yeast Infection     HPI Pt is here for routine health maintenance examination -Thinks she may have a yeast infection again. Possible BV again. Was given metronidiazole gel by her OBGYN and was told to contact their office if it didn't resolve as a longer treatment course may be needed. She plans to contact them and does have diflucan at home that she can take  -She is also thinking about starting a probiotic and plans to ask them about on sh found that is supposed to help with vaginal health -She also still has some lower abdominal/pelvic pain and is undergoing further work up for this by other providers -throat is still a little sore. Will start salt water gargle and drink warm tea/honey, and try soothing throat lozenges -She is UTD on PHM  Current Medication: Outpatient Encounter Medications as of 05/24/2022  Medication Sig   cholecalciferol (VITAMIN D3) 25 MCG (1000 UNIT) tablet Take 1,000 Units by mouth daily.   cyclobenzaprine (FLEXERIL) 5 MG tablet Take 1 tablet (5 mg total) by mouth 3 (three) times daily as needed for muscle spasms.   fluticasone (FLONASE) 50 MCG/ACT nasal spray Place 2 sprays into both nostrils daily.   Multiple Vitamins-Minerals (ONE-A-DAY WOMENS PO) Take by mouth.   ondansetron (ZOFRAN) 4 MG tablet Take 1 tablet (4 mg total) by mouth every 8 (eight) hours as needed for nausea or vomiting.   polyethylene glycol (MIRALAX) 17 g packet Take 17 g by mouth daily.   sertraline (ZOLOFT) 50 MG tablet Take 1 tablet (50 mg total) by mouth daily.   [DISCONTINUED] amitriptyline (ELAVIL) 10 MG tablet Take 1 tablet (10 mg total) by mouth at bedtime.   No facility-administered encounter medications on  file as of 05/24/2022.    Surgical History: Past Surgical History:  Procedure Laterality Date   BREAST SURGERY     2 lumps removed- neg   COLONOSCOPY WITH PROPOFOL N/A 04/25/2022   Procedure: COLONOSCOPY WITH PROPOFOL;  Surgeon: Toney Reil, MD;  Location: [facility] ENDOSCOPY;  Service: Gastroenterology;  Laterality: N/A;    Medical History: Past Medical History:  Diagnosis Date   ADD (attention deficit disorder)    Vitamin D deficiency     Family History: Family History  Problem Relation Age of Onset   Ovarian cancer Paternal Grandmother    Atrial fibrillation Father    Breast cancer Neg Hx    Colon cancer Neg Hx    Diabetes Neg Hx       Review of Systems  Constitutional:  Negative for chills, fatigue and unexpected weight change.  HENT:  Positive for postnasal drip and sore throat. Negative for congestion, rhinorrhea and sneezing.   Eyes:  Negative for redness.  Respiratory:  Negative for cough, chest tightness and shortness of breath.   Cardiovascular:  Negative for chest pain and palpitations.  Gastrointestinal:  Positive for abdominal pain. Negative for constipation, diarrhea, nausea and vomiting.  Genitourinary:  Positive for vaginal discharge. Negative for dysuria and frequency.  Musculoskeletal:  Negative for arthralgias, back pain, joint swelling and neck pain.  Skin:  Negative for rash.  Neurological: Negative.  Negative for tremors and numbness.  Hematological:  Negative for adenopathy. Does not bruise/bleed easily.  Psychiatric/Behavioral:  Negative for behavioral problems (Depression), sleep disturbance and suicidal ideas. The patient is nervous/anxious.      Vital Signs: BP 116/70   Pulse 92   Temp 98.3 F (36.8 C)   Resp 16   Ht 5' (1.524 m)   Wt 140 lb (63.5 kg)   LMP 04/30/2022 (Exact Date)   SpO2 99%   BMI 27.34 kg/m    Physical Exam Vitals and nursing note reviewed.  Constitutional:      General: She is not in acute distress.     Appearance: Normal appearance. She is well-developed. She is not diaphoretic.  HENT:     Head: Normocephalic and atraumatic.     Mouth/Throat:     Pharynx: No oropharyngeal exudate.  Eyes:     Pupils: Pupils are equal, round, and reactive to light.  Neck:     Thyroid: No thyromegaly.     Vascular: No JVD.     Trachea: No tracheal deviation.  Cardiovascular:     Rate and Rhythm: Normal rate and regular rhythm.     Heart sounds: Normal heart sounds. No murmur heard.    No friction rub. No gallop.  Pulmonary:     Effort: Pulmonary effort is normal. No respiratory distress.     Breath sounds: No wheezing or rales.  Chest:     Chest wall: No tenderness.  Abdominal:     General: Bowel sounds are normal. There is no distension.     Palpations: Abdomen is soft.     Tenderness: There is abdominal tenderness.  Musculoskeletal:        General: Normal range of motion.     Cervical back: Normal range of motion and neck supple.  Lymphadenopathy:     Cervical: No cervical adenopathy.  Skin:    General: Skin is warm and dry.  Neurological:     Mental Status: She is alert and oriented to person, place, and time.     Cranial Nerves: No cranial nerve deficit.  Psychiatric:        Thought Content: Thought content normal.        Judgment: Judgment normal.      LABS: Recent Results (from the past 2160 hour(s))  Cervicovaginal ancillary only     Status: Abnormal   Collection Time: 04/05/22  2:59 PM  Result Value Ref Range   Bacterial Vaginitis (gardnerella) Positive (A)    Candida Vaginitis Negative    Candida Glabrata Negative    Comment      Normal Reference Range Bacterial Vaginosis - Negative   Comment Normal Reference Range Candida Species - Negative    Comment Normal Reference Range Candida Galbrata - Negative   Urine Culture     Status: None   Collection Time: 04/05/22  3:39 PM   Specimen: Urine   UR  Result Value Ref Range   Urine Culture, Routine Final report    Organism  ID, Bacteria Comment     Comment: Mixed urogenital flora 10,000-25,000 colony forming units per mL   Urinalysis, Routine w reflex microscopic     Status: Abnormal   Collection Time: 04/07/22  4:35 PM  Result Value Ref Range   Color, Urine YELLOW (A) YELLOW   APPearance CLEAR (A) CLEAR   Specific Gravity, Urine 1.003 (L) 1.005 - 1.030   pH 8.0 5.0 - 8.0   Glucose, UA NEGATIVE NEGATIVE mg/dL   Hgb urine dipstick LARGE (A) NEGATIVE   Bilirubin Urine NEGATIVE NEGATIVE   Ketones, ur NEGATIVE NEGATIVE  mg/dL   Protein, ur NEGATIVE NEGATIVE mg/dL   Nitrite NEGATIVE NEGATIVE   Leukocytes,Ua NEGATIVE NEGATIVE   RBC / HPF 11-20 0 - 5 RBC/hpf   WBC, UA 0-5 0 - 5 WBC/hpf   Bacteria, UA RARE (A) NONE SEEN   Squamous Epithelial / LPF 0-5 0 - 5    Comment: Performed at [facility], [address] [place], [place] [address]  Lipase, blood     Status: None   Collection Time: 04/07/22  4:36 PM  Result Value Ref Range   Lipase 41 11 - 51 U/L    Comment: Performed at [facility], [address] [place], [place] [address]  Comprehensive metabolic panel     Status: Abnormal   Collection Time: 04/07/22  4:36 PM  Result Value Ref Range   Sodium 138 135 - 145 mmol/L   Potassium 3.6 3.5 - 5.1 mmol/L   Chloride 103 98 - 111 mmol/L   CO2 27 22 - 32 mmol/L   Glucose, Bld 96 70 - 99 mg/dL    Comment: Glucose reference range applies only to samples taken after fasting for at least 8 hours.   BUN 12 6 - 20 mg/dL   Creatinine, Ser 7.01 0.44 - 1.00 mg/dL   Calcium 9.7 8.9 - 77.9 mg/dL   Total Protein 8.6 (H) 6.5 - 8.1 g/dL   Albumin 4.8 3.5 - 5.0 g/dL   AST 25 15 - 41 U/L   ALT 23 0 - 44 U/L   Alkaline Phosphatase 68 38 - 126 U/L   Total Bilirubin 0.6 0.3 - 1.2 mg/dL   GFR, Estimated >39 >03 mL/min    Comment: (NOTE) Calculated using the CKD-EPI Creatinine Equation (2021)    Anion gap 8 5 - 15    Comment: Performed at [facility], [address] [place],  [place] [address]  CBC     Status: None   Collection Time: 04/07/22  4:36 PM  Result Value Ref Range   WBC 9.6 4.0 - 10.5 K/uL   RBC 4.81 3.87 - 5.11 MIL/uL   Hemoglobin 14.1 12.0 - 15.0 g/dL   HCT 30.0 76.2 - 26.3 %   MCV 89.4 80.0 - 100.0 fL   MCH 29.3 26.0 - 34.0 pg   MCHC 32.8 30.0 - 36.0 g/dL   RDW 33.5 45.6 - 25.6 %   Platelets 339 150 - 400 K/uL   nRBC 0.0 0.0 - 0.2 %    Comment: Performed at [facility], [address] [place], [place] [address]  POC urine preg, ED     Status: None   Collection Time: 04/07/22  4:38 PM  Result Value Ref Range   Preg Test, Ur NEGATIVE NEGATIVE    Comment:        THE SENSITIVITY OF THIS METHODOLOGY IS >24 mIU/mL   GI Profile, Stool, PCR     Status: None   Collection Time: 04/12/22  4:24 PM  Result Value Ref Range   Campylobacter Not Detected Not Detected   C difficile toxin A/B Not Detected Not Detected   Plesiomonas shigelloides Not Detected Not Detected   Salmonella Not Detected Not Detected   Vibrio Not Detected Not Detected   Vibrio cholerae Not Detected Not Detected   Yersinia enterocolitica Not Detected Not Detected   Enteroaggregative E coli Not Detected Not Detected   Enteropathogenic E coli Not Detected Not Detected   Enterotoxigenic E coli Not Detected Not Detected   Shiga-toxin-producing E  coli Not Detected Not Detected   E coli O157 Not applicable Not Detected   Shigella/Enteroinvasive E coli Not Detected Not Detected   Cryptosporidium Not Detected Not Detected   Cyclospora cayetanensis Not Detected Not Detected   Entamoeba histolytica Not Detected Not Detected   Giardia lamblia Not Detected Not Detected   Adenovirus F 40/41 Not Detected Not Detected   Astrovirus Not Detected Not Detected   Norovirus GI/GII Not Detected Not Detected   Rotavirus A Not Detected Not Detected   Sapovirus Not Detected Not Detected  H. pylori breath test     Status: None   Collection Time: 04/13/22 12:11 PM  Result Value Ref Range   H.  pylori UBiT Negative Negative    Comment: (NOTE) Performed At: [facility] [address] [place], [place] [id number] Jolene Schimke MD WU:[id number]   POCT Urinalysis Dipstick     Status: Abnormal   Collection Time: 04/19/22  9:48 AM  Result Value Ref Range   Color, UA     Clarity, UA     Glucose, UA Negative Negative   Bilirubin, UA Small    Ketones, UA Moderate    Spec Grav, UA 1.015 1.010 - 1.025   Blood, UA Trace    pH, UA 7.0 5.0 - 8.0   Protein, UA Negative Negative   Urobilinogen, UA 0.2 0.2 or 1.0 E.U./dL   Nitrite, UA Negative    Leukocytes, UA Small (1+) (A) Negative   Appearance     Odor    CULTURE, URINE COMPREHENSIVE     Status: None   Collection Time: 04/19/22  4:19 PM   Specimen: Urine   Urine  Result Value Ref Range   Urine Culture, Comprehensive Final report    Organism ID, Bacteria Comment     Comment: No growth in 36 - 48 hours.  Pregnancy, urine POC     Status: None   Collection Time: 04/25/22  9:50 AM  Result Value Ref Range   Preg Test, Ur NEGATIVE NEGATIVE    Comment:        THE SENSITIVITY OF THIS METHODOLOGY IS >24 mIU/mL   Celiac Disease Panel     Status: None   Collection Time: 04/25/22 11:22 AM  Result Value Ref Range   Endomysial Ab, IgA Negative Negative   Tissue Transglutaminase Ab, IgA <2 0 - 3 U/mL    Comment: (NOTE)                              Negative        0 -  3                              Weak Positive   4 - 10                              Positive           >10 Tissue Transglutaminase (tTG) has been identified as the endomysial antigen.  Studies have demonstr- ated that endomysial IgA antibodies have over 99% specificity for gluten sensitive enteropathy.    IgA 129 87 - 352 mg/dL    Comment: (NOTE) Performed At: [facility] [address] [place], [place] [id number] Jolene Schimke MD VH:[id number]   Celiac Disease Panel     Status: None   Collection Time:  05/02/22  4:59 PM  Result Value Ref Range    Endomysial IgA Negative Negative   Transglutaminase IgA <2 0 - 3 U/mL    Comment:                               Negative        0 -  3                               Weak Positive   4 - 10                               Positive           >10  Tissue Transglutaminase (tTG) has been identified  as the endomysial antigen.  Studies have demonstr-  ated that endomysial IgA antibodies have over 99%  specificity for gluten sensitive enteropathy.    IgA/Immunoglobulin A, Serum 135 87 - 352 mg/dL  IgE Food w/Component Reflex II     Status: None   Collection Time: 05/02/22  4:59 PM  Result Value Ref Range   Class Description Allergens Comment     Comment:     Levels of Specific IgE       Class  Description of Class     ---------------------------  -----  --------------------                    < 0.10         0         Negative            0.10 -    0.31         0/I       Equivocal/Low            0.32 -    0.55         I         Low            0.56 -    1.40         II        Moderate            1.41 -    3.90         III       High            3.91 -   19.00         IV        Very High           19.01 -  100.00         V         Very High                   >100.00         VI        Very High    Clam IgE <0.10 Class 0 kU/L   Codfish IgE <0.10 Class 0 kU/L   Allergen Corn, IgE <0.10 Class 0 kU/L   Scallop IgE <0.10 Class 0 kU/L   Sesame Seed IgE <0.10 Class 0 kU/L   Shrimp IgE <0.10 Class 0 kU/L   Soybean IgE <0.10 Class 0 kU/L   Wheat IgE <0.10 Class 0  kU/L   F002-IgE Milk <0.10 Class 0 kU/L   F001-IgE Egg White <0.10 Class 0 kU/L   Peanut, IgE <0.10 Class 0 kU/L   F017-IgE Hazelnut ([place]) <0.10 Class 0 kU/L   F256-IgE Walnut <0.10 Class 0 kU/L   F202-IgE Cashew Nut <0.10 Class 0 kU/L   F018-IgE [place] Nut <0.10 Class 0 kU/L   Macadamia Nut, IgE <0.10 Class 0 kU/L   Pecan Nut IgE <0.10 Class 0 kU/L   F203-IgE Pistachio Nut <0.10 Class 0 kU/L   F020-IgE Almond <0.10 Class 0 kU/L   POCT urinalysis dipstick     Status: Abnormal   Collection Time: 05/03/22  2:33 PM  Result Value Ref Range   Color, UA     Clarity, UA     Glucose, UA Negative Negative   Bilirubin, UA neg    Ketones, UA neg    Spec Grav, UA <=1.005 (A) 1.010 - 1.025   Blood, UA moderate    pH, UA 7.0 5.0 - 8.0   Protein, UA Negative Negative   Urobilinogen, UA 0.2 0.2 or 1.0 E.U./dL   Nitrite, UA neg    Leukocytes, UA Negative Negative   Appearance     Odor    Cytology - PAP     Status: None   Collection Time: 05/03/22  3:21 PM  Result Value Ref Range   High risk HPV Negative    Adequacy      Satisfactory for evaluation; transformation zone component PRESENT.   Diagnosis      - Negative for intraepithelial lesion or malignancy (NILM)   Comment Normal Reference Range HPV - Negative   Cervicovaginal ancillary only     Status: Abnormal   Collection Time: 05/03/22  3:21 PM  Result Value Ref Range   Neisseria Gonorrhea Negative    Chlamydia Negative    Trichomonas Negative    Bacterial Vaginitis (gardnerella) Positive (A)    Candida Vaginitis Positive (A)    Candida Glabrata Negative    Comment      Normal Reference Range Bacterial Vaginosis - Negative   Comment Normal Reference Range Candida Species - Negative    Comment Normal Reference Range Candida Galbrata - Negative    Comment Normal Reference Range Trichomonas - Negative    Comment Normal Reference Ranger Chlamydia - Negative    Comment      Normal Reference Range Neisseria Gonorrhea - Negative  Urine Culture     Status: None   Collection Time: 05/03/22  3:27 PM   UR  Result Value Ref Range   Urine Culture, Routine Final report    Organism ID, Bacteria Comment     Comment: Mixed urogenital flora 25,000-50,000 colony forming units per mL   Calprotectin, Fecal     Status: None   Collection Time: 05/07/22  4:21 PM   Specimen: Stool  Result Value Ref Range   Calprotectin, Fecal 34 0 - 120 ug/g    Comment: Concentration      Interpretation   Follow-Up < 5 - 50 ug/g     Normal           None >50 -120 ug/g     Borderline       Re-evaluate in 4-6 weeks     >120 ug/g     Abnormal         Repeat as clinically  indicated   Urinalysis, Complete     Status: Abnormal   Collection Time: 05/10/22  8:59 AM  Result Value Ref Range   Specific Gravity, UA 1.020 1.005 - 1.030   pH, UA 8.5 (H) 5.0 - 7.5   Color, UA Yellow Yellow   Appearance Ur Hazy (A) Clear   Leukocytes,UA 1+ (A) Negative   Protein,UA Negative Negative/Trace   Glucose, UA Negative Negative   Ketones, UA Negative Negative   RBC, UA Negative Negative   Bilirubin, UA Negative Negative   Urobilinogen, Ur 0.2 0.2 - 1.0 mg/dL   Nitrite, UA Negative Negative   Microscopic Examination See below:   Microscopic Examination     Status: Abnormal   Collection Time: 05/10/22  8:59 AM   Urine  Result Value Ref Range   WBC, UA 6-10 (A) 0 - 5 /hpf   RBC, Urine 0-2 0 - 2 /hpf   Epithelial Cells (non renal) 0-10 0 - 10 /hpf   Crystals Present (A) N/A   Crystal Type Amorphous Sediment N/A   Bacteria, UA Few (A) None seen/Few  CULTURE, URINE COMPREHENSIVE     Status: None   Collection Time: 05/10/22 11:47 AM   Specimen: Urine   UR  Result Value Ref Range   Urine Culture, Comprehensive Final report    Organism ID, Bacteria Comment     Comment: Mixed urogenital flora 10,000-25,000 colony forming units per mL   Mononucleosis screen     Status: None   Collection Time: 05/24/22 10:55 AM  Result Value Ref Range   Mono Screen Negative Negative    Comment: The sensitivity of Heterophile antibody testing is 80-90%. Malachi CarlEpstein Barr IgM testing offers higher sensitivity.   UA/M w/rflx Culture, Routine     Status: Abnormal   Collection Time: 05/24/22 11:50 AM   Specimen: Urine   Urine  Result Value Ref Range   Specific Gravity, UA 1.023 1.005 - 1.030   pH, UA 6.0 5.0 - 7.5   Color, UA Yellow Yellow   Appearance Ur Clear Clear    Leukocytes,UA 2+ (A) Negative   Protein,UA Negative Negative/Trace   Glucose, UA Negative Negative   Ketones, UA Negative Negative   RBC, UA Negative Negative   Bilirubin, UA Negative Negative   Urobilinogen, Ur 0.2 0.2 - 1.0 mg/dL   Nitrite, UA Negative Negative   Microscopic Examination See below:     Comment: Microscopic was indicated and was performed.   Urinalysis Reflex Comment     Comment: This specimen has reflexed to a Urine Culture.  Microscopic Examination     Status: Abnormal   Collection Time: 05/24/22 11:50 AM   Urine  Result Value Ref Range   WBC, UA 11-30 (A) 0 - 5 /hpf   RBC, Urine None seen 0 - 2 /hpf   Epithelial Cells (non renal) 0-10 0 - 10 /hpf   Casts None seen None seen /lpf   Bacteria, UA Few None seen/Few  Urine Culture, Reflex     Status: None   Collection Time: 05/24/22 11:50 AM   Urine  Result Value Ref Range   Urine Culture, Routine Final report    Organism ID, Bacteria Comment     Comment: Mixed urogenital flora 10,000-25,000 colony forming units per mL   Cervicovaginal ancillary only     Status: Abnormal   Collection Time: 05/25/22 10:40 AM  Result Value Ref Range   Bacterial Vaginitis (gardnerella) Positive (A)    Candida Vaginitis Negative  Candida Glabrata Negative    Comment Normal Reference Range Candida Species - Negative    Comment Normal Reference Range Candida Galbrata - Negative    Comment      Normal Reference Range Bacterial Vaginosis - Negative        Assessment/Plan: 1. Encounter for general adult medical examination with abnormal findings CPE performed, UTD on PHM  2. Sore throat Will try salt water gargle, warm tea with honey, and throat lozenges to soothe throat. Advised to stay well hydrated  3. Yeast infection May take diflucan  4. BV (bacterial vaginosis) Will contact OBGYN for possible treatment extension  5. Dysuria - UA/M w/rflx Culture, Routine   General Counseling: Tina Rodgers  understanding of the findings of todays visit and agrees with plan of treatment. I have discussed any further diagnostic evaluation that may be needed or ordered today. We also reviewed her medications today. she has been encouraged to call the office with any questions or concerns that should arise related to todays visit.    Counseling:    Orders Placed This Encounter  Procedures   Microscopic Examination   Urine Culture, Reflex   UA/M w/rflx Culture, Routine    No orders of the defined types were placed in this encounter.   This patient was seen by Lynn Ito, PA-C in collaboration with Dr. Beverely Risen as a part of collaborative care agreement.  Total time spent:35 Minutes  Time spent includes review of chart, medications, test results, and follow up plan with the patient.     Lyndon Code, MD  Internal Medicine

## 2022-05-25 ENCOUNTER — Ambulatory Visit
Admission: RE | Admit: 2022-05-25 | Discharge: 2022-05-25 | Disposition: A | Discharge status: Home / Self Care | Payer: BC Managed Care – PPO | Source: Ambulatory Visit | Attending: Surgery | Admitting: Surgery

## 2022-05-25 ENCOUNTER — Other Ambulatory Visit: Payer: Self-pay

## 2022-05-25 ENCOUNTER — Other Ambulatory Visit (HOSPITAL_COMMUNITY)
Admission: RE | Admit: 2022-05-25 | Discharge: 2022-05-25 | Disposition: A | Discharge status: Home / Self Care | Payer: BC Managed Care – PPO | Source: Ambulatory Visit | Attending: Obstetrics | Admitting: Obstetrics

## 2022-05-25 ENCOUNTER — Ambulatory Visit (INDEPENDENT_AMBULATORY_CARE_PROVIDER_SITE_OTHER): Payer: BC Managed Care – PPO | Admitting: Obstetrics

## 2022-05-25 DIAGNOSIS — N949 Unspecified condition associated with female genital organs and menstrual cycle: Secondary | ICD-10-CM

## 2022-05-25 DIAGNOSIS — N898 Other specified noninflammatory disorders of vagina: Secondary | ICD-10-CM

## 2022-05-25 DIAGNOSIS — R109 Unspecified abdominal pain: Secondary | ICD-10-CM

## 2022-05-25 DIAGNOSIS — R1031 Right lower quadrant pain: Secondary | ICD-10-CM

## 2022-05-25 DIAGNOSIS — N760 Acute vaginitis: Secondary | ICD-10-CM | POA: Diagnosis not present

## 2022-05-25 LAB — MONONUCLEOSIS SCREEN: Mono Screen: NEGATIVE

## 2022-05-25 MED ORDER — GADOBUTROL 1 MMOL/ML IV SOLN
6.0000 mL | Freq: Once | INTRAVENOUS | Status: AC | PRN
Start: 2022-05-25 — End: 2022-05-25
  Administered 2022-05-25: 6 mL via INTRAVENOUS

## 2022-05-27 LAB — UA/M W/RFLX CULTURE, ROUTINE
Bilirubin, UA: NEGATIVE
Glucose, UA: NEGATIVE
Ketones, UA: NEGATIVE
Nitrite, UA: NEGATIVE
Protein,UA: NEGATIVE
RBC, UA: NEGATIVE
Specific Gravity, UA: 1.023 (ref 1.005–1.030)
Urobilinogen, Ur: 0.2 mg/dL (ref 0.2–1.0)
pH, UA: 6 (ref 5.0–7.5)

## 2022-05-27 LAB — MICROSCOPIC EXAMINATION
Casts: NONE SEEN /lpf
RBC, Urine: NONE SEEN /hpf (ref 0–2)

## 2022-05-27 LAB — URINE CULTURE, REFLEX

## 2022-05-28 ENCOUNTER — Other Ambulatory Visit: Payer: Self-pay | Admitting: Obstetrics

## 2022-05-28 ENCOUNTER — Encounter: Payer: Self-pay | Admitting: Obstetrics

## 2022-05-28 ENCOUNTER — Telehealth: Payer: Self-pay | Admitting: Obstetrics

## 2022-05-28 ENCOUNTER — Other Ambulatory Visit: Payer: Self-pay | Admitting: Internal Medicine

## 2022-05-28 LAB — CERVICOVAGINAL ANCILLARY ONLY
Bacterial Vaginitis (gardnerella): POSITIVE — AB
Candida Glabrata: NEGATIVE
Candida Vaginitis: NEGATIVE
Comment: NEGATIVE
Comment: NEGATIVE
Comment: NEGATIVE

## 2022-05-28 MED ORDER — METRONIDAZOLE 500 MG PO TABS: 500.0000 mg | ORAL_TABLET | Freq: Two times a day (BID) | ORAL | 0 refills | Status: DC

## 2022-05-28 MED ORDER — BORIC ACID VAGINAL 600 MG VA SUPP: 1.0000 | Freq: Every day | VAGINAL | 0 refills | Status: DC

## 2022-05-28 MED ORDER — METRONIDAZOLE 0.75 % EX GEL: 1.0000 | CUTANEOUS | 0 refills | Status: DC

## 2022-05-28 NOTE — Telephone Encounter (Signed)
Patient called and states that the recurring BV started around the time she started using an old vibrator. It was a shiny silver bullet but it was starting to wear where the outside was deteriorating. She was just wondering if it was maybe some debris that might be inside the vagina. States that she used this while on her menstrual. She is just wondering if this could be the cause. She states that she is fine with the course that you are taking with treatment, she just wanted to be truthful in case it helps. Patient states she is currently on vacation and will return Saturday the week of July 4th. Please advise.

## 2022-05-29 ENCOUNTER — Ambulatory Visit: Payer: BC Managed Care – PPO | Admitting: Surgery

## 2022-05-30 ENCOUNTER — Other Ambulatory Visit: Payer: Self-pay | Admitting: Obstetrics

## 2022-05-30 ENCOUNTER — Encounter: Payer: Self-pay | Admitting: Obstetrics

## 2022-05-30 MED ORDER — BORIC ACID VAGINAL 600 MG VA SUPP: 1.0000 | Freq: Every day | VAGINAL | 0 refills | Status: DC

## 2022-05-31 ENCOUNTER — Other Ambulatory Visit: Payer: Self-pay

## 2022-05-31 MED ORDER — AMITRIPTYLINE HCL 10 MG PO TABS
10.0000 mg | ORAL_TABLET | Freq: Every day | ORAL | 1 refills | Status: DC
Start: 2022-05-31 — End: 2022-06-11

## 2022-06-04 ENCOUNTER — Encounter: Payer: Self-pay | Admitting: Nurse Practitioner

## 2022-06-04 NOTE — Telephone Encounter (Signed)
yes

## 2022-06-06 ENCOUNTER — Other Ambulatory Visit: Payer: Self-pay | Admitting: Physician Assistant

## 2022-06-06 ENCOUNTER — Other Ambulatory Visit: Payer: Self-pay | Admitting: Internal Medicine

## 2022-06-06 ENCOUNTER — Telehealth: Payer: Self-pay

## 2022-06-06 MED ORDER — BETAMETHASONE DIPROPIONATE AUG 0.05 % EX OINT: TOPICAL_OINTMENT | Freq: Two times a day (BID) | CUTANEOUS | 0 refills | Status: DC

## 2022-06-07 ENCOUNTER — Other Ambulatory Visit: Payer: Self-pay | Admitting: Internal Medicine

## 2022-06-07 NOTE — Telephone Encounter (Signed)
Spoke to pt and she is aware med has been sent to pharmacy

## 2022-06-08 MED ORDER — BETAMETHASONE DIPROPIONATE AUG 0.05 % EX OINT: TOPICAL_OINTMENT | Freq: Two times a day (BID) | CUTANEOUS | 2 refills | Status: DC

## 2022-06-11 ENCOUNTER — Ambulatory Visit (INDEPENDENT_AMBULATORY_CARE_PROVIDER_SITE_OTHER): Payer: BC Managed Care – PPO | Admitting: Urology

## 2022-06-11 ENCOUNTER — Encounter: Payer: Self-pay | Admitting: Urology

## 2022-06-11 VITALS — BP 119/72 | HR 93 | Ht 62.0 in | Wt 140.0 lb

## 2022-06-11 DIAGNOSIS — R399 Unspecified symptoms and signs involving the genitourinary system: Secondary | ICD-10-CM

## 2022-06-11 DIAGNOSIS — R300 Dysuria: Secondary | ICD-10-CM | POA: Diagnosis not present

## 2022-06-11 NOTE — Progress Notes (Signed)
06/11/2022 9:55 AM   Tina Rodgers [birth year] [id number]  Referring provider: Lyndon Code, MD [address] [place],  [place] [address]  Chief Complaint  Patient presents with   Follow-up    HPI: 31 y.o. female referred for follow-up.  Initially seen 05/10/2022 Saw general surgery 05/15/2022 for abdominal pain and right upper quadrant ultrasound/pelvic MRI ordered which showed no significant abnormalities She has been diagnosed with BV x2 since last visit and urine cultures negative x2 Given a trial of Gemtesa for storage related voiding symptoms.  She did note less frequency though did have side effects including skin erythema She has noted dietary triggers to her symptoms including caffeine and cantaloupe which she has avoided She has dysuria typically with the first morning void which improves as the day progresses.  She does take Azo on occasions which helps  PMH: Past Medical History:  Diagnosis Date   ADD (attention deficit disorder)    Vitamin D deficiency     Surgical History: Past Surgical History:  Procedure Laterality Date   BREAST SURGERY     2 lumps removed- neg   COLONOSCOPY WITH PROPOFOL N/A 04/25/2022   Procedure: COLONOSCOPY WITH PROPOFOL;  Surgeon: Toney Reil, MD;  Location: [facility] ENDOSCOPY;  Service: Gastroenterology;  Laterality: N/A;    Home Medications:  Allergies as of 06/11/2022       Reactions   Dust Mite Mixed Allergen Ext [mite (d. Farinae)]    Sulfa Antibiotics Other (See Comments)   flush        Medication List        Accurate as of June 11, 2022  9:55 AM. If you have any questions, ask your nurse or doctor.          STOP taking these medications    amitriptyline 10 MG tablet Commonly known as: ELAVIL Stopped by: Riki Altes, MD   etodolac 400 MG tablet Commonly known as: LODINE Stopped by: Riki Altes, MD   methylphenidate 10 MG tablet Commonly known as: RITALIN Stopped by: Riki Altes,  MD   metroNIDAZOLE 500 MG tablet Commonly known as: FLAGYL Stopped by: Riki Altes, MD   ondansetron 4 MG tablet Commonly known as: Zofran Stopped by: Riki Altes, MD       TAKE these medications    augmented betamethasone dipropionate 0.05 % ointment Commonly known as: DIPROLENE-AF Apply topically 2 (two) times daily.   Boric Acid Vaginal 600 MG Supp Place 1 suppository vaginally at bedtime.   busPIRone 5 MG tablet Commonly known as: BUSPAR   cholecalciferol 25 MCG (1000 UNIT) tablet Commonly known as: VITAMIN D3 Take 1,000 Units by mouth daily.   cyclobenzaprine 5 MG tablet Commonly known as: FLEXERIL Take 1 tablet (5 mg total) by mouth 3 (three) times daily as needed for muscle spasms.   fluticasone 50 MCG/ACT nasal spray Commonly known as: FLONASE Place 2 sprays into both nostrils daily.   metroNIDAZOLE 0.75 % gel Commonly known as: METROGEL Apply 1 Application topically 2 (two) times a week.   ONE-A-DAY WOMENS PO Take by mouth.   polyethylene glycol 17 g packet Commonly known as: MiraLax Take 17 g by mouth daily.   sertraline 50 MG tablet Commonly known as: ZOLOFT Take 1 tablet (50 mg total) by mouth daily.        Allergies:  Allergies  Allergen Reactions   Dust Mite Mixed Allergen Ext [Mite (D. Farinae)]    Sulfa Antibiotics Other (See Comments)  flush    Family History: Family History  Problem Relation Age of Onset   Ovarian cancer Paternal Grandmother    Atrial fibrillation Father    Breast cancer Neg Hx    Colon cancer Neg Hx    Diabetes Neg Hx     Social History:  reports that she has never smoked. She has never used smokeless tobacco. She reports current alcohol use. She reports that she does not use drugs.   Physical Exam: BP 119/72   Pulse 93   Ht 5\' 2"  (1.575 m)   Wt 140 lb (63.5 kg)   BMI 25.61 kg/m   Constitutional:  Alert and oriented, No acute distress. HEENT: [place] AT Respiratory: Normal respiratory  effort, no increased work of breathing. Psychiatric: Normal mood and affect.  Laboratory Data:  Urinalysis Dipstick negative Microscopy negative   Assessment & Plan:    1.  Dysuria Improved with avoidance of dietary triggers.  We discussed her dysuria in the a.m. may be secondary to concentrated urine.  Azo has been beneficial.  We also discussed a trial of generic Uribel though she wanted to hold off at this time  2.  Lower urinary tract symptoms Improved with dietary trigger avoidance We did discuss Prelief which may help with dietary triggers 6 month follow-up for recheck and instructed call earlier for any worsening symptoms   , MD  [facility] [address] [place], [place] [phone]

## 2022-06-12 ENCOUNTER — Encounter
Admission: RE | Admit: 2022-06-12 | Discharge: 2022-06-12 | Disposition: A | Discharge status: Home / Self Care | Payer: BC Managed Care – PPO | Source: Ambulatory Visit | Attending: Surgery | Admitting: Surgery

## 2022-06-12 ENCOUNTER — Ambulatory Visit: Payer: BC Managed Care – PPO | Admitting: Surgery

## 2022-06-12 DIAGNOSIS — R109 Unspecified abdominal pain: Secondary | ICD-10-CM | POA: Diagnosis not present

## 2022-06-12 LAB — URINALYSIS, COMPLETE
Bilirubin, UA: NEGATIVE
Glucose, UA: NEGATIVE
Ketones, UA: NEGATIVE
Leukocytes,UA: NEGATIVE
Nitrite, UA: NEGATIVE
Protein,UA: NEGATIVE
RBC, UA: NEGATIVE
Specific Gravity, UA: >1.03 — ABNORMAL HIGH (ref 1.005–1.030)
Urobilinogen, Ur: 0.2 mg/dL (ref 0.2–1.0)
pH, UA: 5 (ref 5.0–7.5)

## 2022-06-12 LAB — MICROSCOPIC EXAMINATION
Bacteria, UA: NONE SEEN
Epithelial Cells (non renal): >10 /hpf — ABNORMAL HIGH (ref 0–10)

## 2022-06-12 MED ORDER — TECHNETIUM TC 99M MEBROFENIN IV KIT
5.2600 | PACK | Freq: Once | INTRAVENOUS | Status: AC | PRN
Start: 2022-06-12 — End: 2022-06-12
  Administered 2022-06-12: 5.26 via INTRAVENOUS

## 2022-06-13 ENCOUNTER — Encounter: Payer: Self-pay | Admitting: Urology

## 2022-06-14 ENCOUNTER — Ambulatory Visit: Payer: BC Managed Care – PPO | Admitting: Surgery

## 2022-06-19 ENCOUNTER — Other Ambulatory Visit: Payer: Self-pay

## 2022-06-19 ENCOUNTER — Ambulatory Visit: Payer: BC Managed Care – PPO | Admitting: Surgery

## 2022-06-19 ENCOUNTER — Encounter: Payer: Self-pay | Admitting: Surgery

## 2022-06-19 VITALS — BP 105/72 | HR 86 | Temp 98.6°F | Ht 60.0 in | Wt 137.4 lb

## 2022-06-19 DIAGNOSIS — R1031 Right lower quadrant pain: Secondary | ICD-10-CM | POA: Insufficient documentation

## 2022-06-19 DIAGNOSIS — Z789 Other specified health status: Secondary | ICD-10-CM | POA: Insufficient documentation

## 2022-06-19 NOTE — Telephone Encounter (Signed)
Made in error

## 2022-06-19 NOTE — Progress Notes (Signed)
Surgical Clinic Progress/Follow-up Note   HPI:  31 y.o. Female presents to clinic for right-sided abdominal pain follow-up.  Since her last visit we have had a pelvic MRI, abdominal ultrasound and HIDA scan.  It seems she is having some improvement in the degree of discomfort.  Sounds like the fiber capsules she is taking have made her stools more formed, and perhaps too bulky per her desire.  I suspect she is not getting enough hydration with her fiber intake and we discussed this. Patient reports  improvement/resolution of prior issues and has been tolerating regular diet with +flatus and  BM's as discussed, denies N/V, fever/chills, CP, or SOB.  We reviewed the report of her MRI.  The HIDA scan showed an ejection fraction of 50%.  We discussed how the patient felt during the procedure, and it appears the consumption of Ensure did not exacerbate or cause any of her symptoms to recur.  Review of Systems:  Constitutional: denies fever/chills  ENT: denies sore throat, hearing problems  Respiratory: denies shortness of breath, wheezing  Cardiovascular: denies chest pain, palpitations  Gastrointestinal: denies N/V, or diarrhea/and bowel function as per interval history Skin: Denies any other rashes or skin discolorations as per interval history  Vital Signs:  BP 105/72   Pulse 86   Temp 98.6 F (37 C) (Oral)   Ht 5' (1.524 m)   Wt 137 lb 6.4 oz (62.3 kg)   LMP 06/04/2022   SpO2 98%   BMI 26.83 kg/m    Physical Exam:  Constitutional:  -- Normal body habitus  -- Awake, alert, and oriented x3  Pulmonary:  -- No crackles -- Equal breath sounds bilaterally -- Breathing non-labored at rest Cardiovascular:  -- S1, S2 present  -- No pericardial rubs  Gastrointestinal:  -- Soft and non-distended, non-tender/with no tenderness to palpation, no guarding/rebound tenderness -- No abdominal masses appreciated, pulsatile or otherwise   Musculoskeletal / Integumentary:  -- Wounds or skin  discoloration: None appreciated -- Extremities: B/L UE and LE FROM, hands and feet warm, no edema   Laboratory studies: Radiology review: As noted in HPI.   Imaging: No new pertinent imaging available for review, in addition to that mentioned above.  The ultrasound and HIDA scans were personally reviewed.   Assessment:  31 y.o. yo Female with a problem list including...  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  Progressing well.  Plan:              - return to clinic as needed, instructed to call office if any questions or concerns  All of the above recommendations were discussed with the patient and patient's family, and all of patient's and family's questions were answered to their expressed satisfaction. Our time face-to-face, and review of her prior tests, images and progress and discussion for future evaluation/bowel habits etc. together over 30 minutes. These notes generated with voice recognition software.  I apologize for typographical errors.  Campbell Lerner, MD, FACS [place]: [facility] General Surgery - Partnering for exceptional care. Office: [phone]

## 2022-06-19 NOTE — Patient Instructions (Addendum)
Be sure to increase your water intake.    Advised to pursue a goal of 25 to 30 g of fiber daily.  Made aware that the majority of this may be through natural sources, but advised to be aware of actual consumption and to ensure minimal consumption by daily supplementation.  Various forms of supplements discussed.  Recommended Psyllium husk, that mixes well with applesauce, or the powder which goes down well shaken with chocolate milk.  Strongly advised to consume more fluids to ensure adequate hydration, instructed to watch color of urine to determine adequacy of hydration.  Clarity is pursued in urine output, and bowel activity that correlates to significant meal intake.   We need to avoid deferring having bowel movements, advised to take the time at the first sign of sensation, typically following meals, and in the morning.   Subsequent utilization of MiraLAX may be needed ensure at least daily movement, ideally twice daily bowel movements.  If multiple doses of MiraLAX are necessary utilize them. Never skip a day...  To be regular, we must do the above EVERY day.           Please call the office if you have questions or concerns.

## 2022-06-20 ENCOUNTER — Telehealth: Payer: Self-pay | Admitting: *Deleted

## 2022-06-20 NOTE — Telephone Encounter (Signed)
(  Patient send a my chart ) I'm still experiencing pain while I pee and I am still itching. I'm on Azo which only helps a little. What else can be done?   Called patient and offered to make her an appt. She is got another appt on Monday with someone else and she is going to wait  on that appt.

## 2022-06-22 ENCOUNTER — Ambulatory Visit: Payer: BC Managed Care – PPO | Admitting: Physician Assistant

## 2022-06-25 ENCOUNTER — Other Ambulatory Visit: Payer: Self-pay | Admitting: Obstetrics

## 2022-06-25 ENCOUNTER — Other Ambulatory Visit (HOSPITAL_COMMUNITY)
Admission: RE | Admit: 2022-06-25 | Discharge: 2022-06-25 | Disposition: A | Discharge status: Home / Self Care | Payer: BC Managed Care – PPO | Source: Ambulatory Visit | Attending: Certified Nurse Midwife | Admitting: Certified Nurse Midwife

## 2022-06-25 ENCOUNTER — Ambulatory Visit (INDEPENDENT_AMBULATORY_CARE_PROVIDER_SITE_OTHER): Payer: BC Managed Care – PPO | Admitting: Certified Nurse Midwife

## 2022-06-25 VITALS — BP 105/69 | HR 63 | Ht 60.0 in | Wt 138.8 lb

## 2022-06-25 DIAGNOSIS — N898 Other specified noninflammatory disorders of vagina: Secondary | ICD-10-CM | POA: Insufficient documentation

## 2022-06-25 DIAGNOSIS — R300 Dysuria: Secondary | ICD-10-CM | POA: Diagnosis not present

## 2022-06-25 LAB — POCT URINALYSIS DIPSTICK OB
Bilirubin, UA: NEGATIVE
Clarity, UA: NEGATIVE
Glucose, UA: NEGATIVE
Ketones, UA: NEGATIVE
Leukocytes, UA: NEGATIVE
Nitrite, UA: NEGATIVE
POC,PROTEIN,UA: NEGATIVE
Spec Grav, UA: 1.015 (ref 1.010–1.025)
Urobilinogen, UA: NEGATIVE E.U./dL — AB
pH, UA: 7 (ref 5.0–8.0)

## 2022-06-25 MED ORDER — METRONIDAZOLE 0.75 % VA GEL
1.0000 | Freq: Two times a day (BID) | VAGINAL | 0 refills | Status: DC
Start: 2022-06-25 — End: 2022-09-06

## 2022-06-25 NOTE — Progress Notes (Unsigned)
Patient presents today due to UTI symptom and vaginal odor  She states burning with urination, mucoid vaginal discharge, vaginal itching. Urine dipstick preformed, showing only a trace for blood as she is due to start period this week. Per office protocol will await results from urine culture that was sent . Patient states no other concerns at this time. All questions answered.

## 2022-06-26 LAB — CERVICOVAGINAL ANCILLARY ONLY
Bacterial Vaginitis (gardnerella): POSITIVE — AB
Candida Glabrata: NEGATIVE
Candida Vaginitis: NEGATIVE
Comment: NEGATIVE
Comment: NEGATIVE
Comment: NEGATIVE

## 2022-06-27 ENCOUNTER — Other Ambulatory Visit: Payer: Self-pay | Admitting: Certified Nurse Midwife

## 2022-06-27 ENCOUNTER — Encounter: Payer: Self-pay | Admitting: Certified Nurse Midwife

## 2022-06-27 ENCOUNTER — Encounter: Payer: Self-pay | Admitting: Obstetrics

## 2022-06-27 ENCOUNTER — Other Ambulatory Visit: Payer: Self-pay | Admitting: Obstetrics

## 2022-06-27 LAB — URINE CULTURE

## 2022-06-27 MED ORDER — CLINDAMYCIN HCL 300 MG PO CAPS: 300.0000 mg | ORAL_CAPSULE | Freq: Two times a day (BID) | ORAL | 0 refills | Status: AC

## 2022-07-03 ENCOUNTER — Encounter: Payer: BC Managed Care – PPO | Admitting: Certified Nurse Midwife

## 2022-07-23 ENCOUNTER — Other Ambulatory Visit (HOSPITAL_COMMUNITY)
Admission: RE | Admit: 2022-07-23 | Discharge: 2022-07-23 | Disposition: A | Discharge status: Home / Self Care | Payer: BC Managed Care – PPO | Source: Ambulatory Visit | Attending: Certified Nurse Midwife | Admitting: Certified Nurse Midwife

## 2022-07-23 ENCOUNTER — Ambulatory Visit (INDEPENDENT_AMBULATORY_CARE_PROVIDER_SITE_OTHER): Payer: BC Managed Care – PPO | Admitting: Certified Nurse Midwife

## 2022-07-23 VITALS — BP 108/71 | HR 98 | Ht 60.0 in | Wt 143.1 lb

## 2022-07-23 DIAGNOSIS — N898 Other specified noninflammatory disorders of vagina: Secondary | ICD-10-CM | POA: Insufficient documentation

## 2022-07-23 DIAGNOSIS — N760 Acute vaginitis: Secondary | ICD-10-CM | POA: Diagnosis not present

## 2022-07-23 LAB — POCT URINALYSIS DIPSTICK
Bilirubin, UA: NEGATIVE
Blood, UA: NEGATIVE
Glucose, UA: NEGATIVE
Ketones, UA: NEGATIVE
Leukocytes, UA: NEGATIVE
Nitrite, UA: NEGATIVE
Protein, UA: NEGATIVE
Spec Grav, UA: 1.01 (ref 1.010–1.025)
Urobilinogen, UA: 0.2 E.U./dL
pH, UA: 6 (ref 5.0–8.0)

## 2022-07-23 NOTE — Progress Notes (Signed)
GYN ENCOUNTER NOTE  Subjective:       Tina Rodgers is a 31 y.o. G22P0101 female is here for gynecologic evaluation of the following issues:  1. Recurrent BV.  Test of cure    Gynecologic History No LMP recorded. Contraception: condoms Last Pap: 02/20/2021. Results were: normal Last mammogram: n/a.   Obstetric History OB History  Gravida Para Term Preterm AB Living  1 1   1   1   SAB IAB Ectopic Multiple Live Births        0 1    # Outcome Date GA Lbr Len/2nd Weight Sex Delivery Anes PTL Lv  1 Preterm 07/15/18 [redacted]w[redacted]d 05:05 / 03:10 6 lb 5.6 oz (2.88 kg) F Vag-Spont Local  LIV    Past Medical History:  Diagnosis Date   ADD (attention deficit disorder)    Vitamin D deficiency     Past Surgical History:  Procedure Laterality Date   BREAST SURGERY     2 lumps removed- neg   COLONOSCOPY WITH PROPOFOL N/A 04/25/2022   Procedure: COLONOSCOPY WITH PROPOFOL;  Surgeon: 04/27/2022, MD;  Location: [facility] ENDOSCOPY;  Service: Gastroenterology;  Laterality: N/A;    Current Outpatient Medications on File Prior to Visit  Medication Sig Dispense Refill   augmented betamethasone dipropionate (DIPROLENE-AF) 0.05 % ointment Apply topically 2 (two) times daily. 45 g 2   Boric Acid Vaginal 600 MG SUPP Place 1 suppository vaginally at bedtime. 21 suppository 0   busPIRone (BUSPAR) 5 MG tablet      cholecalciferol (VITAMIN D3) 25 MCG (1000 UNIT) tablet Take 1,000 Units by mouth daily.     cyclobenzaprine (FLEXERIL) 5 MG tablet Take 1 tablet (5 mg total) by mouth 3 (three) times daily as needed for muscle spasms. 30 tablet 0   fluticasone (FLONASE) 50 MCG/ACT nasal spray Place 2 sprays into both nostrils daily.     metroNIDAZOLE (METROGEL VAGINAL) 0.75 % vaginal gel Place 1 Applicatorful vaginally 2 (two) times daily. 70 g 0   metroNIDAZOLE (METROGEL) 0.75 % gel Apply 1 Application topically 2 (two) times a week. 45 g 0   Multiple Vitamins-Minerals (ONE-A-DAY WOMENS PO) Take by  mouth.     polyethylene glycol (MIRALAX) 17 g packet Take 17 g by mouth daily. 14 each 0   sertraline (ZOLOFT) 50 MG tablet Take 1 tablet (50 mg total) by mouth daily. 90 tablet 2   No current facility-administered medications on file prior to visit.    Allergies  Allergen Reactions   Dust Mite Mixed Allergen Ext [Mite (D. Farinae)]    Misc. Sulfonamide Containing Compounds Other (See Comments)   Sulfa Antibiotics Other (See Comments)    flush    Social History   Socioeconomic History   Marital status: Single    Spouse name: Not on file   Number of children: Not on file   Years of education: Not on file   Highest education level: Not on file  Occupational History   Not on file  Tobacco Use   Smoking status: Never   Smokeless tobacco: Never  Vaping Use   Vaping Use: Never used  Substance and Sexual Activity   Alcohol use: Yes    Comment: ocassionally   Drug use: No   Sexual activity: Yes    Birth control/protection: Condom  Other Topics Concern   Not on file  Social History Narrative   Not on file   Social Determinants of Health   Financial Resource Strain: Not on file  Food Insecurity: Not on file  Transportation Needs: Not on file  Physical Activity: Not on file  Stress: Not on file  Social Connections: Not on file  Intimate Partner Violence: Not on file    Family History  Problem Relation Age of Onset   Ovarian cancer Paternal Grandmother    Atrial fibrillation Father    Breast cancer Neg Hx    Colon cancer Neg Hx    Diabetes Neg Hx     The following portions of the patient's history were reviewed and updated as appropriate: allergies, current medications, past family history, past medical history, past social history, past surgical history and problem list.  Review of Systems Review of Systems - Negative except as mentioned in HPI Review of Systems - General ROS: negative for - chills, fatigue, fever, hot flashes, malaise or night  sweats Hematological and Lymphatic ROS: negative for - bleeding problems or swollen lymph nodes Gastrointestinal ROS: negative for - abdominal pain, blood in stools, change in bowel habits and nausea/vomiting Musculoskeletal ROS: negative for - joint pain, muscle pain or muscular weakness Genito-Urinary ROS: negative for - change in menstrual cycle, dysmenorrhea, dyspareunia, dysuria, genital discharge, genital ulcers, hematuria, incontinence, irregular/heavy menses, nocturia or pelvic painjj  Objective:   BP 108/71   Pulse 98   Ht 5' (1.524 m)   Wt 143 lb 1.6 oz (64.9 kg)   BMI 27.95 kg/m  CONSTITUTIONAL: Well-developed, well-nourished female in no acute distress.  HENT:  Normocephalic, atraumatic.  NECK: Normal range of motion, supple, no masses.  Normal thyroid.  SKIN: Skin is warm and dry. No rash noted. Not diaphoretic. No erythema. No pallor. NEUROLGIC: Alert and oriented to person, place, and time. PSYCHIATRIC: Normal mood and affect. Normal behavior. Normal judgment and thought content. CARDIOVASCULAR:Not Examined RESPIRATORY: Not Examined BREASTS: Not Examined ABDOMEN: Soft, non distended; Non tender.  No Organomegaly. PELVIC:  External Genitalia: Normal  BUS: Normal  Vagina: Normal  Cervix: Normal, mild redness, white discharge , no odor MUSCULOSKELETAL: Normal range of motion. No tenderness.  No cyanosis, clubbing, or edema.   Assessment:   1. Vaginal irritation - POCT urinalysis dipstick     Plan:    Recurrent BV, she is having test of cure. States she does not feel like the infection has resolved. Notes she has odor and irritation . Will follow up with results.   Doreene Burke, CNM

## 2022-07-25 LAB — CERVICOVAGINAL ANCILLARY ONLY
Bacterial Vaginitis (gardnerella): POSITIVE — AB
Candida Glabrata: NEGATIVE
Candida Vaginitis: NEGATIVE
Comment: NEGATIVE
Comment: NEGATIVE
Comment: NEGATIVE

## 2022-07-27 ENCOUNTER — Encounter: Payer: Self-pay | Admitting: Certified Nurse Midwife

## 2022-07-30 ENCOUNTER — Other Ambulatory Visit: Payer: Self-pay | Admitting: Certified Nurse Midwife

## 2022-07-30 ENCOUNTER — Encounter: Payer: Self-pay | Admitting: Certified Nurse Midwife

## 2022-07-30 MED ORDER — METRONIDAZOLE 0.75 % VA GEL: 1.0000 | Freq: Every day | VAGINAL | 6 refills | Status: DC

## 2022-08-10 ENCOUNTER — Ambulatory Visit (INDEPENDENT_AMBULATORY_CARE_PROVIDER_SITE_OTHER): Payer: BC Managed Care – PPO | Admitting: Certified Nurse Midwife

## 2022-08-10 ENCOUNTER — Encounter: Payer: Self-pay | Admitting: Obstetrics and Gynecology

## 2022-08-10 ENCOUNTER — Ambulatory Visit: Payer: BC Managed Care – PPO

## 2022-08-10 ENCOUNTER — Other Ambulatory Visit (HOSPITAL_COMMUNITY)
Admission: RE | Admit: 2022-08-10 | Discharge: 2022-08-10 | Disposition: A | Discharge status: Home / Self Care | Payer: BC Managed Care – PPO | Source: Ambulatory Visit | Attending: Obstetrics and Gynecology | Admitting: Obstetrics and Gynecology

## 2022-08-10 VITALS — BP 101/68 | HR 98 | Ht 60.0 in | Wt 142.0 lb

## 2022-08-10 DIAGNOSIS — R102 Pelvic and perineal pain: Secondary | ICD-10-CM | POA: Diagnosis present

## 2022-08-10 DIAGNOSIS — N760 Acute vaginitis: Secondary | ICD-10-CM | POA: Diagnosis not present

## 2022-08-10 NOTE — Progress Notes (Signed)
31 y.o. female complains of white vaginal discharge for a while as well as pelvic pain . Denies abnormal vaginal bleeding,fever. No UTI symptoms. Denies history of known exposure to STD probe sent to lab.

## 2022-08-14 LAB — CERVICOVAGINAL ANCILLARY ONLY
Bacterial Vaginitis (gardnerella): POSITIVE — AB
Candida Glabrata: NEGATIVE
Candida Vaginitis: NEGATIVE
Comment: NEGATIVE
Comment: NEGATIVE
Comment: NEGATIVE

## 2022-08-15 ENCOUNTER — Other Ambulatory Visit: Payer: Self-pay | Admitting: Certified Nurse Midwife

## 2022-08-15 ENCOUNTER — Encounter: Payer: Self-pay | Admitting: Certified Nurse Midwife

## 2022-09-03 ENCOUNTER — Ambulatory Visit (INDEPENDENT_AMBULATORY_CARE_PROVIDER_SITE_OTHER): Payer: BC Managed Care – PPO | Admitting: Physician Assistant

## 2022-09-03 ENCOUNTER — Encounter: Payer: Self-pay | Admitting: Physician Assistant

## 2022-09-03 VITALS — BP 120/69 | HR 95 | Temp 98.0°F | Resp 16 | Ht 60.0 in | Wt 146.4 lb

## 2022-09-03 DIAGNOSIS — J0100 Acute maxillary sinusitis, unspecified: Secondary | ICD-10-CM

## 2022-09-03 DIAGNOSIS — J029 Acute pharyngitis, unspecified: Secondary | ICD-10-CM | POA: Diagnosis not present

## 2022-09-03 LAB — POCT RAPID STREP A (OFFICE): Rapid Strep A Screen: NEGATIVE

## 2022-09-03 MED ORDER — AMOXICILLIN-POT CLAVULANATE 875-125 MG PO TABS: 1.0000 | ORAL_TABLET | Freq: Two times a day (BID) | ORAL | 0 refills | Status: DC

## 2022-09-03 NOTE — Progress Notes (Signed)
[facility] [place], [place] [address]  Internal MEDICINE  Office Visit Note  Patient Name: Tina Rodgers  [id number]  [id number]  Date of Service: 09/03/2022  Chief Complaint  Patient presents with   Sore Throat    Started on Saturday - has had a cold for 1 week, sore throat started after a week of cold symptoms. Tested negative for Covid.     HPI Pt is here for a sick visit. -sore throat since Saturday, now today has more pain with swallowing -tried mucinex, flonase, and tylenol -Had a cold last Sunday and lasted about a week and now the sore throat started after about a week with worsening congestion as well. Right ear hurts a little as well -Has done soup, coffee, tea, lozenges to help soothe throat. -Does have some nasal irritation under nose from blowing nose and advised to try vaseline -Strep and covid both negative -Does report she is on a long-term tx course for BV with vaginal metronidazole twice per week and has another follow up on Thursday  Current Medication:  Outpatient Encounter Medications as of 09/03/2022  Medication Sig   amoxicillin-clavulanate (AUGMENTIN) 875-125 MG tablet Take 1 tablet by mouth 2 (two) times daily. Take with food.   augmented betamethasone dipropionate (DIPROLENE-AF) 0.05 % ointment Apply topically 2 (two) times daily.   Boric Acid Vaginal 600 MG SUPP Place 1 suppository vaginally at bedtime.   busPIRone (BUSPAR) 5 MG tablet    cholecalciferol (VITAMIN D3) 25 MCG (1000 UNIT) tablet Take 1,000 Units by mouth daily.   cyclobenzaprine (FLEXERIL) 5 MG tablet Take 1 tablet (5 mg total) by mouth 3 (three) times daily as needed for muscle spasms.   fluticasone (FLONASE) 50 MCG/ACT nasal spray Place 2 sprays into both nostrils daily.   metroNIDAZOLE (METROGEL VAGINAL) 0.75 % vaginal gel Place 1 Applicatorful vaginally 2 (two) times daily.   metroNIDAZOLE (METROGEL VAGINAL) 0.75 % vaginal gel Place 1  Applicatorful vaginally at bedtime. Once daily x 7 days then 2 time week for 6 months   metroNIDAZOLE (METROGEL) 0.75 % gel Apply 1 Application topically 2 (two) times a week.   Multiple Vitamins-Minerals (ONE-A-DAY WOMENS PO) Take by mouth.   polyethylene glycol (MIRALAX) 17 g packet Take 17 g by mouth daily.   sertraline (ZOLOFT) 50 MG tablet Take 1 tablet (50 mg total) by mouth daily.   No facility-administered encounter medications on file as of 09/03/2022.      Medical History: Past Medical History:  Diagnosis Date   ADD (attention deficit disorder)    Vitamin D deficiency      Vital Signs: BP 120/69   Pulse 95   Temp 98 F (36.7 C)   Resp 16   Ht 5' (1.524 m)   Wt 146 lb 6.4 oz (66.4 kg)   SpO2 99%   BMI 28.59 kg/m    Review of Systems  Constitutional:  Positive for fatigue. Negative for fever.  HENT:  Positive for congestion, ear pain, postnasal drip, sinus pressure, sore throat and trouble swallowing. Negative for mouth sores.   Respiratory:  Positive for shortness of breath. Negative for cough and wheezing.   Cardiovascular:  Negative for chest pain.  Genitourinary:  Negative for flank pain.  Psychiatric/Behavioral: Negative.      Physical Exam Vitals and nursing note reviewed.  Constitutional:      General: She is not in acute distress.    Appearance: She is well-developed. She is not diaphoretic.  HENT:  Head: Normocephalic and atraumatic.     Right Ear: Tympanic membrane normal.     Left Ear: Tympanic membrane normal.     Nose: Congestion present.     Mouth/Throat:     Pharynx: Posterior oropharyngeal erythema present. No oropharyngeal exudate.  Eyes:     Pupils: Pupils are equal, round, and reactive to light.  Neck:     Thyroid: No thyromegaly.     Vascular: No JVD.     Trachea: No tracheal deviation.  Cardiovascular:     Rate and Rhythm: Normal rate and regular rhythm.     Heart sounds: Normal heart sounds.  Pulmonary:     Effort:  Pulmonary effort is normal. No respiratory distress.     Breath sounds: No wheezing.  Abdominal:     General: Bowel sounds are normal.     Palpations: Abdomen is soft.  Musculoskeletal:     Cervical back: Normal range of motion and neck supple.  Lymphadenopathy:     Cervical: No cervical adenopathy.  Skin:    General: Skin is warm and dry.  Neurological:     Mental Status: She is alert and oriented to person, place, and time.     Cranial Nerves: No cranial nerve deficit.  Psychiatric:        Behavior: Behavior normal.        Thought Content: Thought content normal.        Judgment: Judgment normal.       Assessment/Plan: 1. Sore throat - POCT rapid strep A is negative, will continue warm tea with honey, soup, lozenges and salt water gargle to help soothe throat  2. Acute non-recurrent maxillary sinusitis May start on augmentin BID with food and continue flonase and mucinex. - amoxicillin-clavulanate (AUGMENTIN) 875-125 MG tablet; Take 1 tablet by mouth 2 (two) times daily. Take with food.  Dispense: 20 tablet; Refill: 0   General Counseling: ilda laskin understanding of the findings of todays visit and agrees with plan of treatment. I have discussed any further diagnostic evaluation that may be needed or ordered today. We also reviewed her medications today. she has been encouraged to call the office with any questions or concerns that should arise related to todays visit.    Counseling:    Orders Placed This Encounter  Procedures   POCT rapid strep A    Meds ordered this encounter  Medications   amoxicillin-clavulanate (AUGMENTIN) 875-125 MG tablet    Sig: Take 1 tablet by mouth 2 (two) times daily. Take with food.    Dispense:  20 tablet    Refill:  0    Time spent:30 Minutes

## 2022-09-06 ENCOUNTER — Other Ambulatory Visit (HOSPITAL_COMMUNITY)
Admission: RE | Admit: 2022-09-06 | Discharge: 2022-09-06 | Disposition: A | Discharge status: Home / Self Care | Payer: BC Managed Care – PPO | Source: Ambulatory Visit | Attending: Obstetrics and Gynecology | Admitting: Obstetrics and Gynecology

## 2022-09-06 ENCOUNTER — Encounter: Payer: Self-pay | Admitting: Obstetrics and Gynecology

## 2022-09-06 ENCOUNTER — Ambulatory Visit (INDEPENDENT_AMBULATORY_CARE_PROVIDER_SITE_OTHER): Payer: BC Managed Care – PPO | Admitting: Obstetrics and Gynecology

## 2022-09-06 VITALS — BP 114/71 | HR 75 | Ht 60.0 in | Wt 147.0 lb

## 2022-09-06 DIAGNOSIS — N898 Other specified noninflammatory disorders of vagina: Secondary | ICD-10-CM

## 2022-09-06 DIAGNOSIS — R102 Pelvic and perineal pain: Secondary | ICD-10-CM | POA: Diagnosis not present

## 2022-09-06 DIAGNOSIS — N760 Acute vaginitis: Secondary | ICD-10-CM

## 2022-09-06 DIAGNOSIS — B9689 Other specified bacterial agents as the cause of diseases classified elsewhere: Secondary | ICD-10-CM

## 2022-09-06 MED ORDER — DESOGESTREL-ETHINYL ESTRADIOL 0.15-0.02/0.01 MG (21/5) PO TABS: 1.0000 | ORAL_TABLET | Freq: Every day | ORAL | 1 refills | Status: DC

## 2022-09-06 NOTE — Progress Notes (Signed)
Patient presents today for ongoing BV concerns. She states she has been struggling with BV since May, recently she was started on a 6 month treatment and it currently on week 3. Her current symptoms include bloating, pelvic pain, mucous-like discharge and occasional odor and itching.Patient states no other questions or concerns.

## 2022-09-06 NOTE — Progress Notes (Signed)
HPI:      [name] is a 31 y.o. G1P0101 who LMP was Patient's last menstrual period was 08/27/2022.  Subjective:   She presents today with several issues. 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  She has been treated appropriately followed by long-term use of boric acid which has failed.  She has now 3 weeks into a long-term regimen of MetroGel twice weekly.  She is requesting cultures today to confirm that she does not have a current infection. She has recurrent pelvic pain especially around the time of her menses.  She also has very heavy menstrual cycles.  Her pelvic pain often occurs on the right side similar to an area that to a hernia.  She has undergone MRI without evidence of hernia and seen a general surgeon who says he can find no hernia. She has been diagnosed with interstitial cystitis which obviously can cause pelvic pain, dysuria, dysmenorrhea, dyspareunia.  She has seen urology for this and was started on the medication but she says she had a reaction to it and discontinued the meds. She says that she occasionally has constipation issues and does not have regular bowel movements.  She says she is not sure if this constipation contributes to her pelvic pain. Finally, she is considering pregnancy within a year and does not want to do anything longer term than 1 year.    Hx: The following portions of the patient's history were reviewed and updated as appropriate:             She  has a past medical history of ADD (attention deficit disorder) and Vitamin D deficiency. She does not have any pertinent problems on file. She  has a past surgical history that includes Breast surgery and Colonoscopy with propofol (N/A, 04/25/2022). Her family history includes Atrial fibrillation in her father; Ovarian cancer in her paternal grandmother. She  reports that she has never smoked. She has never used smokeless tobacco. She reports current alcohol  use. She reports that she does not use drugs. She has a current medication list which includes the following prescription(s): amoxicillin-clavulanate, cholecalciferol, desogestrel-ethinyl estradiol, fluticasone, metronidazole, multiple vitamins-minerals, polyethylene glycol, and sertraline. She is allergic to dust mite mixed allergen ext [mite (d. farinae)], misc. sulfonamide containing compounds, and sulfa antibiotics.       Review of Systems:  Review of Systems  Constitutional: Denied constitutional symptoms, night sweats, recent illness, fatigue, fever, insomnia and weight loss.  Eyes: Denied eye symptoms, eye pain, photophobia, vision change and visual disturbance.  Ears/Nose/Throat/Neck: Denied ear, nose, throat or neck symptoms, hearing loss, nasal discharge, sinus congestion and sore throat.  Cardiovascular: Denied cardiovascular symptoms, arrhythmia, chest pain/pressure, edema, exercise intolerance, orthopnea and palpitations.  Respiratory: Denied pulmonary symptoms, asthma, pleuritic pain, productive sputum, cough, dyspnea and wheezing.  Gastrointestinal: Denied, gastro-esophageal reflux, melena, nausea and vomiting.  Genitourinary: See HPI for additional information.  Musculoskeletal: Denied musculoskeletal symptoms, stiffness, swelling, muscle weakness and myalgia.  Dermatologic: Denied dermatology symptoms, rash and scar.  Neurologic: Denied neurology symptoms, dizziness, headache, neck pain and syncope.  Psychiatric: Denied psychiatric symptoms, anxiety and depression.  Endocrine: Denied endocrine symptoms including hot flashes and night sweats.   Meds:   Current Outpatient Medications on File Prior to Visit  Medication Sig Dispense Refill   amoxicillin-clavulanate (AUGMENTIN) 875-125 MG tablet Take 1 tablet by mouth 2 (two) times daily. Take with food. 20 tablet 0   cholecalciferol (VITAMIN D3) 25 MCG (1000 UNIT) tablet  Take 1,000 Units by mouth daily.     fluticasone  (FLONASE) 50 MCG/ACT nasal spray Place 2 sprays into both nostrils daily.     metroNIDAZOLE (METROGEL VAGINAL) 0.75 % vaginal gel Place 1 Applicatorful vaginally at bedtime. Once daily x 7 days then 2 time week for 6 months 70 g 6   Multiple Vitamins-Minerals (ONE-A-DAY WOMENS PO) Take by mouth.     polyethylene glycol (MIRALAX) 17 g packet Take 17 g by mouth daily. 14 each 0   sertraline (ZOLOFT) 50 MG tablet Take 1 tablet (50 mg total) by mouth daily. 90 tablet 2   No current facility-administered medications on file prior to visit.      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 Pelvic pressure in female   2. Vaginal irritation   3. Vaginal itching   4. Pelvic pain   5. BV (bacterial vaginosis)    A/P  I think the current regimen and previous regimens as treatment for BV have been appropriate.  She needs to continue the MetroGel to see if this will work for her.  Nuswab performed today  After initially reading her record I thought endometriosis a high likelihood for her.  I think with the diagnosis of IC this can certainly explain all of her pelvic symptoms.  Nevertheless, these 2 things can occur concurrently.  I have suggested OCPs for cycle control as well as possible part suppression of endometriosis.  We have also discussed  Lupron and Myfembree however these are much longer term and she may not want to do these prior to attempting pregnancy.  I have suggested she follow-up with the urologist and further discuss her IC and see if there is any other treatment they would like to undergo.  I have suggested daily fiber laxatives for 1 month to control her constipation and take that out of the equation of pelvic pain.           Orders No orders of the defined types were placed in this encounter.    Meds ordered this encounter  Medications   desogestrel-ethinyl estradiol (MIRCETTE) 0.15-0.02/0.01 MG (21/5) tablet    Sig: Take 1 tablet by mouth at bedtime.    Dispense:  84 tablet    Refill:  1      F/U  Return in about 3 months (around 12/07/2022). I spent 30 minutes involved in the care of this patient preparing to see the patient by obtaining and reviewing her medical history (including labs, imaging tests and prior procedures), documenting clinical information in the electronic health record (EHR), counseling and coordinating care plans, writing and sending prescriptions, ordering tests or procedures and in direct communicating with the patient and  medical staff discussing pertinent items from her history and physical exam.  Elonda Husky, M.D. 09/06/2022 3:55 PM

## 2022-09-11 LAB — CERVICOVAGINAL ANCILLARY ONLY
Bacterial Vaginitis (gardnerella): NEGATIVE
Candida Glabrata: NEGATIVE
Candida Vaginitis: NEGATIVE
Chlamydia: NEGATIVE
Comment: NEGATIVE
Comment: NEGATIVE
Comment: NEGATIVE
Comment: NEGATIVE
Comment: NEGATIVE
Comment: NORMAL
Neisseria Gonorrhea: NEGATIVE
Trichomonas: NEGATIVE

## 2022-09-13 ENCOUNTER — Encounter: Payer: Self-pay | Admitting: Obstetrics and Gynecology

## 2022-09-18 ENCOUNTER — Other Ambulatory Visit (HOSPITAL_COMMUNITY)
Admission: RE | Admit: 2022-09-18 | Discharge: 2022-09-18 | Disposition: A | Discharge status: Home / Self Care | Payer: BC Managed Care – PPO | Source: Ambulatory Visit | Attending: Obstetrics and Gynecology | Admitting: Obstetrics and Gynecology

## 2022-09-18 ENCOUNTER — Encounter: Payer: Self-pay | Admitting: Obstetrics and Gynecology

## 2022-09-18 ENCOUNTER — Ambulatory Visit (INDEPENDENT_AMBULATORY_CARE_PROVIDER_SITE_OTHER): Payer: BC Managed Care – PPO

## 2022-09-18 VITALS — BP 116/63 | HR 91 | Ht 60.0 in | Wt 146.0 lb

## 2022-09-18 DIAGNOSIS — N760 Acute vaginitis: Secondary | ICD-10-CM | POA: Insufficient documentation

## 2022-09-18 NOTE — Progress Notes (Signed)
Pt reports having a vaginal discharge on and off for 5 months, has been treated for BV but wants to make sure its gone. Reports no odor with discharge.

## 2022-09-19 LAB — CERVICOVAGINAL ANCILLARY ONLY
Bacterial Vaginitis (gardnerella): POSITIVE — AB
Candida Glabrata: NEGATIVE
Candida Vaginitis: NEGATIVE
Comment: NEGATIVE
Comment: NEGATIVE
Comment: NEGATIVE

## 2022-09-20 ENCOUNTER — Other Ambulatory Visit: Payer: Self-pay | Admitting: Obstetrics and Gynecology

## 2022-09-20 ENCOUNTER — Other Ambulatory Visit: Payer: Self-pay

## 2022-09-20 DIAGNOSIS — N760 Acute vaginitis: Secondary | ICD-10-CM

## 2022-09-20 DIAGNOSIS — B9689 Other specified bacterial agents as the cause of diseases classified elsewhere: Secondary | ICD-10-CM

## 2022-09-20 DIAGNOSIS — R102 Pelvic and perineal pain: Secondary | ICD-10-CM

## 2022-09-20 MED ORDER — SOLOSEC 2 G PO PACK: 2.0000 g | PACK | Freq: Once | ORAL | 1 refills | Status: AC

## 2022-09-26 ENCOUNTER — Other Ambulatory Visit: Payer: Self-pay

## 2022-09-26 NOTE — Telephone Encounter (Signed)
Let's try to get a PA on this patient since she has recurrent BV and has tried all other options. Also, not sure if she tried to use the coupon card which may also lower dose.

## 2022-09-27 NOTE — Telephone Encounter (Signed)
Patient states she was able to get the medication already. Does not need PA at this time.

## 2022-09-27 NOTE — Telephone Encounter (Signed)
Reaching out to patient on if she was able to get medication.

## 2022-09-28 NOTE — Telephone Encounter (Signed)
Awesome!

## 2022-10-02 ENCOUNTER — Other Ambulatory Visit: Payer: Self-pay | Admitting: Physician Assistant

## 2022-10-02 DIAGNOSIS — F411 Generalized anxiety disorder: Secondary | ICD-10-CM

## 2022-10-04 ENCOUNTER — Telehealth: Payer: Self-pay

## 2022-10-04 NOTE — Telephone Encounter (Signed)
Pt calling; got the wrong rx called in to pharm;  it was supposed to be metronidazole intravag but recv'd the topical instead can the right rx be sent in?  She is on the 72m plan for BV.  [phone]  Adv pt metronidazole vag gel was sent in in Aug with 6 refills [facility] here; topical was sent in 10/25th [facility]; [facility] p/u rx for pt; adv pt to ck c pharm since refill are on file and they didn't fill it correctly. Pt to contact us if we need to do anything.

## 2022-10-17 ENCOUNTER — Ambulatory Visit (INDEPENDENT_AMBULATORY_CARE_PROVIDER_SITE_OTHER): Payer: BC Managed Care – PPO

## 2022-10-17 VITALS — BP 107/70 | HR 86 | Ht 60.0 in | Wt 148.0 lb

## 2022-10-17 DIAGNOSIS — N898 Other specified noninflammatory disorders of vagina: Secondary | ICD-10-CM

## 2022-10-17 NOTE — Progress Notes (Signed)
    NURSE VISIT NOTE  Subjective:    [name], female    DOB: 1991-02-16, 31 y.o.   MRN: [id number]  HPI  Patient is a 31 y.o. G44P0101 female who presents for white, creamy, mucoid, odorless, and thin vaginal discharge for 6 month(s). Denies abnormal vaginal bleeding or significant pelvic pain or fever. admits to dysuriadenies history of known exposure to STD.  The following portions of the patient's history were reviewed and updated as appropriate: allergies, current medications, past family history, past medical history, past social history, past surgical history, and problem list.  Review of Systems Pertinent items are noted in HPI.   Objective:   There were no vitals taken for this visit. There is no height or weight on file to calculate BMI. General appearance: alert, cooperative, and no distress  Urine dipstick: patient unable to produce specimen.  Assessment:   No diagnosis found.  nonspecific vaginitis  Plan:   Nu Swab VG+ sent to lab per [name], CNM. Treatment: Will await swab results. ROV prn if symptoms persist or worsen.

## 2022-10-22 LAB — NUSWAB VAGINITIS PLUS (VG+)
Candida albicans, NAA: NEGATIVE
Candida glabrata, NAA: NEGATIVE
Chlamydia trachomatis, NAA: NEGATIVE
Neisseria gonorrhoeae, NAA: NEGATIVE
Trich vag by NAA: NEGATIVE

## 2022-10-23 ENCOUNTER — Ambulatory Visit (INDEPENDENT_AMBULATORY_CARE_PROVIDER_SITE_OTHER): Payer: BC Managed Care – PPO | Admitting: Nurse Practitioner

## 2022-10-23 ENCOUNTER — Encounter: Payer: Self-pay | Admitting: Nurse Practitioner

## 2022-10-23 VITALS — BP 116/62 | HR 80 | Temp 98.1°F | Resp 16 | Ht 60.0 in | Wt 148.2 lb

## 2022-10-23 DIAGNOSIS — B3731 Acute candidiasis of vulva and vagina: Secondary | ICD-10-CM

## 2022-10-23 DIAGNOSIS — N3001 Acute cystitis with hematuria: Secondary | ICD-10-CM | POA: Diagnosis not present

## 2022-10-23 DIAGNOSIS — R300 Dysuria: Secondary | ICD-10-CM | POA: Diagnosis not present

## 2022-10-23 DIAGNOSIS — N390 Urinary tract infection, site not specified: Secondary | ICD-10-CM

## 2022-10-23 LAB — POCT URINALYSIS DIPSTICK
Bilirubin, UA: NEGATIVE
Glucose, UA: NEGATIVE
Nitrite, UA: NEGATIVE
Protein, UA: NEGATIVE
Spec Grav, UA: 1.01 (ref 1.010–1.025)
Urobilinogen, UA: 0.2 E.U./dL
pH, UA: 7 (ref 5.0–8.0)

## 2022-10-23 MED ORDER — FLUCONAZOLE 150 MG PO TABS: 150.0000 mg | ORAL_TABLET | Freq: Every day | ORAL | 0 refills | Status: AC

## 2022-10-23 MED ORDER — NITROFURANTOIN MONOHYD MACRO 100 MG PO CAPS: 100.0000 mg | ORAL_CAPSULE | Freq: Two times a day (BID) | ORAL | 0 refills | Status: AC

## 2022-10-23 NOTE — Progress Notes (Signed)
[facility] [address] [place], [place] [address]  Internal MEDICINE  Office Visit Note  Patient Name: Tina Rodgers  [id number]  [id number]  Date of Service: 10/23/2022  Chief Complaint  Patient presents with   Acute Visit    uti     HPI Resa presents for an acute sick visit for UTI --onset: 3 days ago --location:  --duration:  --Associated sx: burning, itching, urgency, frequency, urinary discomfort.  --what alleviates: AZO --what aggravates: --Severity: mod  No other concerns will be addressed during sick visit except for standard med refills. Will need follow up for additional problems or concerns.     Current Medication:  Outpatient Encounter Medications as of 10/23/2022  Medication Sig   cholecalciferol (VITAMIN D3) 25 MCG (1000 UNIT) tablet Take 1,000 Units by mouth daily.   desogestrel-ethinyl estradiol (MIRCETTE) 0.15-0.02/0.01 MG (21/5) tablet Take 1 tablet by mouth at bedtime.   fluconazole (DIFLUCAN) 150 MG tablet Take 1 tablet (150 mg total) by mouth daily for 5 doses.   fluticasone (FLONASE) 50 MCG/ACT nasal spray Place 2 sprays into both nostrils daily.   metroNIDAZOLE (METROGEL VAGINAL) 0.75 % vaginal gel Place 1 Applicatorful vaginally at bedtime. Once daily x 7 days then 2 time week for 6 months   Multiple Vitamins-Minerals (ONE-A-DAY WOMENS PO) Take by mouth.   nitrofurantoin, macrocrystal-monohydrate, (MACROBID) 100 MG capsule Take 1 capsule (100 mg total) by mouth 2 (two) times daily for 7 days. Take with food.   polyethylene glycol (MIRALAX) 17 g packet Take 17 g by mouth daily.   sertraline (ZOLOFT) 50 MG tablet TAKE 1 TABLET BY MOUTH EVERY DAY   No facility-administered encounter medications on file as of 10/23/2022.      Medical History: Past Medical History:  Diagnosis Date   ADD (attention deficit disorder)    Vitamin D deficiency      Vital Signs: BP 116/62   Pulse 80   Temp 98.1 F (36.7 C)   Resp  16   Ht 5' (1.524 m)   Wt 148 lb 3.2 oz (67.2 kg)   LMP 10/05/2022   SpO2 97%   BMI 28.94 kg/m    Review of Systems  Constitutional:  Positive for fatigue.  Respiratory: Negative.  Negative for cough, chest tightness, shortness of breath and wheezing.   Cardiovascular: Negative.  Negative for chest pain and palpitations.  Genitourinary:  Positive for difficulty urinating, dyspareunia, dysuria, flank pain, frequency, hematuria and vaginal pain.       Vaginal itching  Musculoskeletal:  Negative for back pain.    Physical Exam Vitals reviewed.  Constitutional:      General: She is not in acute distress.    Appearance: Normal appearance. She is not ill-appearing.  HENT:     Head: Normocephalic and atraumatic.  Eyes:     Pupils: Pupils are equal, round, and reactive to light.  Cardiovascular:     Rate and Rhythm: Normal rate and regular rhythm.  Pulmonary:     Effort: Pulmonary effort is normal. No respiratory distress.  Abdominal:     Tenderness: There is no abdominal tenderness.  Neurological:     Mental Status: She is alert and oriented to person, place, and time.  Psychiatric:        Mood and Affect: Mood normal.        Behavior: Behavior normal.       Assessment/Plan: 1. Acute cystitis with hematuria Empiric antibiotic treatment prescribed. Urine culture sent.  - CULTURE, URINE  COMPREHENSIVE - nitrofurantoin, macrocrystal-monohydrate, (MACROBID) 100 MG capsule; Take 1 capsule (100 mg total) by mouth 2 (two) times daily for 7 days. Take with food.  Dispense: 14 capsule; Refill: 0  2. Dysuria Urinalysis positive for trace blood and moderate leukocytes, urine culture sent  - POCT urinalysis dipstick - CULTURE, URINE COMPREHENSIVE  3. Vulvovaginal candidiasis Fluconazole prescribed x5 days - fluconazole (DIFLUCAN) 150 MG tablet; Take 1 tablet (150 mg total) by mouth daily for 5 doses.  Dispense: 5 tablet; Refill: 0   General Counseling: tieara arevalos  understanding of the findings of todays visit and agrees with plan of treatment. I have discussed any further diagnostic evaluation that may be needed or ordered today. We also reviewed her medications today. she has been encouraged to call the office with any questions or concerns that should arise related to todays visit.    Counseling:    Orders Placed This Encounter  Procedures   CULTURE, URINE COMPREHENSIVE   POCT urinalysis dipstick    Meds ordered this encounter  Medications   nitrofurantoin, macrocrystal-monohydrate, (MACROBID) 100 MG capsule    Sig: Take 1 capsule (100 mg total) by mouth 2 (two) times daily for 7 days. Take with food.    Dispense:  14 capsule    Refill:  0    Fill today please   fluconazole (DIFLUCAN) 150 MG tablet    Sig: Take 1 tablet (150 mg total) by mouth daily for 5 doses.    Dispense:  5 tablet    Refill:  0    Fill today please    Return if symptoms worsen or fail to improve.  [place] Controlled Substance Database was reviewed by me for overdose risk score (ORS)  Time spent:20 Minutes Time spent with patient included reviewing progress notes, labs, imaging studies, and discussing plan for follow up.   This patient was seen by Jonetta Osgood, FNP-C in collaboration with Dr. Clayborn Bigness as a part of collaborative care agreement.  Jozette Castrellon R. Valetta Fuller, MSN, FNP-C Internal Medicine

## 2022-10-26 LAB — CULTURE, URINE COMPREHENSIVE

## 2022-10-30 ENCOUNTER — Telehealth: Payer: Self-pay

## 2022-10-30 NOTE — Telephone Encounter (Signed)
Pt called that she still having urine frequency and burning as per alyssa advised her urine culture is normal mixed flora  that she need call her GYN or urology mean while she drink plenty water and cranberry juice and finished her antibiotics

## 2022-11-07 ENCOUNTER — Telehealth (INDEPENDENT_AMBULATORY_CARE_PROVIDER_SITE_OTHER): Payer: BC Managed Care – PPO | Admitting: Nurse Practitioner

## 2022-11-07 ENCOUNTER — Encounter: Payer: Self-pay | Admitting: Nurse Practitioner

## 2022-11-07 DIAGNOSIS — J40 Bronchitis, not specified as acute or chronic: Secondary | ICD-10-CM | POA: Diagnosis not present

## 2022-11-07 DIAGNOSIS — R051 Acute cough: Secondary | ICD-10-CM | POA: Diagnosis not present

## 2022-11-07 MED ORDER — HYDROCOD POLI-CHLORPHE POLI ER 10-8 MG/5ML PO SUER: 5.0000 mL | Freq: Two times a day (BID) | ORAL | 0 refills | Status: DC | PRN

## 2022-11-07 MED ORDER — AZITHROMYCIN 250 MG PO TABS: ORAL_TABLET | ORAL | 0 refills | Status: AC

## 2022-11-07 NOTE — Progress Notes (Signed)
[facility] [address] [place], [place] [address]  Internal MEDICINE  Telephone Visit  Patient Name: Tina Rodgers  [id number]  [id number]  Date of Service: 11/07/2022  I connected with the patient at 1205 by telephone and verified the patients identity using two identifiers.   I discussed the limitations, risks, security and privacy concerns of performing an evaluation and management service by telephone and the availability of in person appointments. I also discussed with the patient that there may be a patient responsible charge related to the service.  The patient expressed understanding and agrees to proceed.    Chief Complaint  Patient presents with   Telephone Screen    Cold last week, now worse cough, neg covid.    Telephone Assessment    [phone]    HPI Tina Rodgers presents for a telehealth virtual visit for URI getting worse. Onset was last week Sx include cough, can't cough thick mucous up  Coughing more at night and in cold air Not sleeping well due to cough   Current Medication: Outpatient Encounter Medications as of 11/07/2022  Medication Sig   azithromycin (ZITHROMAX) 250 MG tablet Take 2 tablets on day 1, then 1 tablet daily on days 2 through 5   chlorpheniramine-HYDROcodone (TUSSIONEX) 10-8 MG/5ML Take 5 mLs by mouth every 12 (twelve) hours as needed.   cholecalciferol (VITAMIN D3) 25 MCG (1000 UNIT) tablet Take 1,000 Units by mouth daily.   desogestrel-ethinyl estradiol (MIRCETTE) 0.15-0.02/0.01 MG (21/5) tablet Take 1 tablet by mouth at bedtime.   fluticasone (FLONASE) 50 MCG/ACT nasal spray Place 2 sprays into both nostrils daily.   metroNIDAZOLE (METROGEL VAGINAL) 0.75 % vaginal gel Place 1 Applicatorful vaginally at bedtime. Once daily x 7 days then 2 time week for 6 months   Multiple Vitamins-Minerals (ONE-A-DAY WOMENS PO) Take by mouth.   polyethylene glycol (MIRALAX) 17 g packet Take 17 g by mouth daily.   sertraline (ZOLOFT) 50  MG tablet TAKE 1 TABLET BY MOUTH EVERY DAY   No facility-administered encounter medications on file as of 11/07/2022.    Surgical History: Past Surgical History:  Procedure Laterality Date   BREAST SURGERY     2 lumps removed- neg   COLONOSCOPY WITH PROPOFOL N/A 04/25/2022   Procedure: COLONOSCOPY WITH PROPOFOL;  Surgeon: Toney Reil, MD;  Location: [facility] ENDOSCOPY;  Service: Gastroenterology;  Laterality: N/A;    Medical History: Past Medical History:  Diagnosis Date   ADD (attention deficit disorder)    Vitamin D deficiency     Family History: Family History  Problem Relation Age of Onset   Ovarian cancer Paternal Grandmother    Atrial fibrillation Father    Breast cancer Neg Hx    Colon cancer Neg Hx    Diabetes Neg Hx     Social History   Socioeconomic History   Marital status: Single    Spouse name: Not on file   Number of children: Not on file   Years of education: Not on file   Highest education level: Not on file  Occupational History   Not on file  Tobacco Use   Smoking status: Never   Smokeless tobacco: Never  Vaping Use   Vaping Use: Never used  Substance and Sexual Activity   Alcohol use: Yes    Comment: ocassionally   Drug use: No   Sexual activity: Yes    Birth control/protection: Condom  Other Topics Concern   Not on file  Social History Narrative   Not  on file   Social Determinants of Health   Financial Resource Strain: Not on file  Food Insecurity: Not on file  Transportation Needs: Not on file  Physical Activity: Not on file  Stress: Not on file  Social Connections: Not on file  Intimate Partner Violence: Not on file      Review of Systems  Constitutional:  Positive for fatigue. Negative for appetite change, chills and fever.  HENT:  Positive for congestion, postnasal drip and sore throat (from coughing).   Respiratory:  Positive for cough. Negative for chest tightness, shortness of breath and wheezing.   Cardiovascular:  Negative.  Negative for chest pain and palpitations.  Psychiatric/Behavioral:  Positive for sleep disturbance.     Vital Signs: LMP 10/05/2022    Observation/Objective: She is alert and oriented and engages in conversation appropriately. She can be heard coughing over the phone. She does not sound as though she is in any acute distress over telephone call.     Assessment/Plan: 1. Bronchitis Zpack and cough syrup ordered, take as prescribed.  - azithromycin (ZITHROMAX) 250 MG tablet; Take 2 tablets on day 1, then 1 tablet daily on days 2 through 5  Dispense: 6 tablet; Refill: 0 - chlorpheniramine-HYDROcodone (TUSSIONEX) 10-8 MG/5ML; Take 5 mLs by mouth every 12 (twelve) hours as needed.  Dispense: 140 mL; Refill: 0  2. Acute cough Medication prescribed for symptomatic relief of cough - chlorpheniramine-HYDROcodone (TUSSIONEX) 10-8 MG/5ML; Take 5 mLs by mouth every 12 (twelve) hours as needed.  Dispense: 140 mL; Refill: 0   General Counseling: Tina Rodgers understanding of the findings of today's phone visit and agrees with plan of treatment. I have discussed any further diagnostic evaluation that may be needed or ordered today. We also reviewed her medications today. she has been encouraged to call the office with any questions or concerns that should arise related to todays visit.  Return if symptoms worsen or fail to improve.   No orders of the defined types were placed in this encounter.   Meds ordered this encounter  Medications   azithromycin (ZITHROMAX) 250 MG tablet    Sig: Take 2 tablets on day 1, then 1 tablet daily on days 2 through 5    Dispense:  6 tablet    Refill:  0   chlorpheniramine-HYDROcodone (TUSSIONEX) 10-8 MG/5ML    Sig: Take 5 mLs by mouth every 12 (twelve) hours as needed.    Dispense:  140 mL    Refill:  0    Time spent:10 Minutes Time spent with patient included reviewing progress notes, labs, imaging studies, and discussing plan for follow  up.  [place] Controlled Substance Database was reviewed by me for overdose risk score (ORS) if appropriate.  This patient was seen by Sallyanne Kuster, FNP-C in collaboration with Dr. Beverely Risen as a part of collaborative care agreement.  Tina Dauria R. Tedd Sias, MSN, FNP-C Internal medicine

## 2022-11-21 ENCOUNTER — Other Ambulatory Visit (HOSPITAL_COMMUNITY)
Admission: RE | Admit: 2022-11-21 | Discharge: 2022-11-21 | Disposition: A | Discharge status: Home / Self Care | Payer: BC Managed Care – PPO | Source: Ambulatory Visit | Attending: Certified Nurse Midwife | Admitting: Certified Nurse Midwife

## 2022-11-21 ENCOUNTER — Ambulatory Visit (INDEPENDENT_AMBULATORY_CARE_PROVIDER_SITE_OTHER): Payer: BC Managed Care – PPO

## 2022-11-21 ENCOUNTER — Encounter: Payer: Self-pay | Admitting: Obstetrics and Gynecology

## 2022-11-21 VITALS — BP 113/66 | HR 89 | Ht 60.0 in | Wt 148.0 lb

## 2022-11-21 DIAGNOSIS — N898 Other specified noninflammatory disorders of vagina: Secondary | ICD-10-CM

## 2022-11-21 DIAGNOSIS — Z23 Encounter for immunization: Secondary | ICD-10-CM | POA: Diagnosis not present

## 2022-11-21 NOTE — Progress Notes (Signed)
Pt reports vaginal discharge, could not give a specific time on how long she has had this going on.  No itching no odor. Pt wants to know if this is just her new normal. Sometimes discharge is thick and sometimes stringy. Pt states she did not take the vaginal metrogel last night.

## 2022-11-22 ENCOUNTER — Ambulatory Visit (INDEPENDENT_AMBULATORY_CARE_PROVIDER_SITE_OTHER): Payer: BC Managed Care – PPO | Admitting: Nurse Practitioner

## 2022-11-22 ENCOUNTER — Encounter: Payer: Self-pay | Admitting: Nurse Practitioner

## 2022-11-22 ENCOUNTER — Ambulatory Visit
Admission: RE | Admit: 2022-11-22 | Discharge: 2022-11-22 | Disposition: A | Discharge status: Home / Self Care | Payer: BC Managed Care – PPO | Attending: Nurse Practitioner | Admitting: Nurse Practitioner

## 2022-11-22 ENCOUNTER — Ambulatory Visit
Admission: RE | Admit: 2022-11-22 | Discharge: 2022-11-22 | Disposition: A | Discharge status: Home / Self Care | Payer: BC Managed Care – PPO | Source: Ambulatory Visit | Attending: Nurse Practitioner | Admitting: Nurse Practitioner

## 2022-11-22 VITALS — BP 126/60 | HR 100 | Temp 98.4°F | Resp 16 | Ht 60.0 in | Wt 150.0 lb

## 2022-11-22 DIAGNOSIS — J189 Pneumonia, unspecified organism: Secondary | ICD-10-CM

## 2022-11-22 DIAGNOSIS — R058 Other specified cough: Secondary | ICD-10-CM | POA: Diagnosis present

## 2022-11-22 DIAGNOSIS — R52 Pain, unspecified: Secondary | ICD-10-CM

## 2022-11-22 LAB — CERVICOVAGINAL ANCILLARY ONLY
Bacterial Vaginitis (gardnerella): POSITIVE — AB
Comment: NEGATIVE

## 2022-11-22 MED ORDER — PREDNISONE 10 MG PO TABS: ORAL_TABLET | ORAL | 0 refills | Status: DC

## 2022-11-22 MED ORDER — AMOXICILLIN-POT CLAVULANATE 875-125 MG PO TABS: 1.0000 | ORAL_TABLET | Freq: Two times a day (BID) | ORAL | 0 refills | Status: DC

## 2022-11-22 NOTE — Progress Notes (Signed)
[facility] [address] [place], [place] [address]  Internal MEDICINE  Office Visit Note  Patient Name: Tina Rodgers  [id number]  [id number]  Date of Service: 11/22/2022  Chief Complaint  Patient presents with   Follow-up    HPI Tina Rodgers presents for a follow up visit for general medical care.  --has been Feeling bad -- covid test negative. Flu going around at school.  Deep painful cough, sinus pressure, chest tightness, SOB, fatigue, chills, nasal congestion and runny.  Was previously diagnosed with possible bronchitis via virtual visit earlier this month.       Current Medication: Outpatient Encounter Medications as of 11/22/2022  Medication Sig   amoxicillin-clavulanate (AUGMENTIN) 875-125 MG tablet Take 1 tablet by mouth 2 (two) times daily.   chlorpheniramine-HYDROcodone (TUSSIONEX) 10-8 MG/5ML Take 5 mLs by mouth every 12 (twelve) hours as needed.   cholecalciferol (VITAMIN D3) 25 MCG (1000 UNIT) tablet Take 1,000 Units by mouth daily.   desogestrel-ethinyl estradiol (MIRCETTE) 0.15-0.02/0.01 MG (21/5) tablet Take 1 tablet by mouth at bedtime.   fluticasone (FLONASE) 50 MCG/ACT nasal spray Place 2 sprays into both nostrils daily.   metroNIDAZOLE (METROGEL VAGINAL) 0.75 % vaginal gel Place 1 Applicatorful vaginally at bedtime. Once daily x 7 days then 2 time week for 6 months   Multiple Vitamins-Minerals (ONE-A-DAY WOMENS PO) Take by mouth.   polyethylene glycol (MIRALAX) 17 g packet Take 17 g by mouth daily.   predniSONE (DELTASONE) 10 MG tablet Take one tab 3 x day for 3 days, then take one tab 2 x a day for 3 days and then take one tab a day for 3 days for URI.   sertraline (ZOLOFT) 50 MG tablet TAKE 1 TABLET BY MOUTH EVERY DAY   No facility-administered encounter medications on file as of 11/22/2022.    Surgical History: Past Surgical History:  Procedure Laterality Date   BREAST SURGERY     2 lumps removed- neg   COLONOSCOPY WITH  PROPOFOL N/A 04/25/2022   Procedure: COLONOSCOPY WITH PROPOFOL;  Surgeon: Toney Reil, MD;  Location: [facility] ENDOSCOPY;  Service: Gastroenterology;  Laterality: N/A;    Medical History: Past Medical History:  Diagnosis Date   ADD (attention deficit disorder)    Vitamin D deficiency     Family History: Family History  Problem Relation Age of Onset   Ovarian cancer Paternal Grandmother    Atrial fibrillation Father    Breast cancer Neg Hx    Colon cancer Neg Hx    Diabetes Neg Hx     Social History   Socioeconomic History   Marital status: Single    Spouse name: Not on file   Number of children: Not on file   Years of education: Not on file   Highest education level: Not on file  Occupational History   Not on file  Tobacco Use   Smoking status: Never   Smokeless tobacco: Never  Vaping Use   Vaping Use: Never used  Substance and Sexual Activity   Alcohol use: Yes    Comment: ocassionally   Drug use: No   Sexual activity: Yes    Birth control/protection: Condom  Other Topics Concern   Not on file  Social History Narrative   Not on file   Social Determinants of Health   Financial Resource Strain: Not on file  Food Insecurity: Not on file  Transportation Needs: Not on file  Physical Activity: Not on file  Stress: Not on file  Social Connections:  Not on file  Intimate Partner Violence: Not on file      Review of Systems  Constitutional:  Positive for chills and fatigue. Negative for fever.  HENT:  Positive for congestion, postnasal drip, rhinorrhea, sinus pressure, sinus pain, sneezing and sore throat.   Respiratory:  Positive for cough, chest tightness and shortness of breath. Negative for wheezing.   Cardiovascular: Negative.  Negative for chest pain and palpitations.  Gastrointestinal: Negative.  Negative for abdominal pain, constipation, diarrhea, nausea and vomiting.  Neurological:  Negative for headaches.    Vital Signs: BP 126/60   Pulse 100    Temp 98.4 F (36.9 C)   Resp 16   Ht 5' (1.524 m)   Wt 150 lb (68 kg)   LMP 11/06/2022   SpO2 97%   BMI 29.29 kg/m    Physical Exam Constitutional:      General: She is not in acute distress.    Appearance: She is ill-appearing.  HENT:     Head: Normocephalic and atraumatic.     Right Ear: Tympanic membrane, ear canal and external ear normal.     Left Ear: Tympanic membrane, ear canal and external ear normal.     Nose: Congestion and rhinorrhea present.     Mouth/Throat:     Pharynx: Posterior oropharyngeal erythema present.  Eyes:     Pupils: Pupils are equal, round, and reactive to light.  Cardiovascular:     Rate and Rhythm: Normal rate and regular rhythm.     Heart sounds: Normal heart sounds. No murmur heard. Pulmonary:     Effort: Pulmonary effort is normal. No accessory muscle usage or respiratory distress.     Breath sounds: Normal air entry. Examination of the right-middle field reveals rales. Examination of the left-middle field reveals rales. Examination of the right-lower field reveals rales. Examination of the left-lower field reveals rales. Rales present.  Neurological:     Mental Status: She is alert.        Assessment/Plan: 1. Pneumonia of both lower lobes due to infectious organism Suspect pneumonia, antibiotic prescribed, chest xray ordered to confirm dx.  - DG Chest 2 View; Future - amoxicillin-clavulanate (AUGMENTIN) 875-125 MG tablet; Take 1 tablet by mouth 2 (two) times daily.  Dispense: 20 tablet; Refill: 0 - predniSONE (DELTASONE) 10 MG tablet; Take one tab 3 x day for 3 days, then take one tab 2 x a day for 3 days and then take one tab a day for 3 days for URI.  Dispense: 18 tablet; Refill: 0  2. Painful cough See problem #1 - DG Chest 2 View; Future - amoxicillin-clavulanate (AUGMENTIN) 875-125 MG tablet; Take 1 tablet by mouth 2 (two) times daily.  Dispense: 20 tablet; Refill: 0 - predniSONE (DELTASONE) 10 MG tablet; Take one tab 3 x day  for 3 days, then take one tab 2 x a day for 3 days and then take one tab a day for 3 days for URI.  Dispense: 18 tablet; Refill: 0   General Counseling: Tina Rodgers understanding of the findings of todays visit and agrees with plan of treatment. I have discussed any further diagnostic evaluation that may be needed or ordered today. We also reviewed her medications today. she has been encouraged to call the office with any questions or concerns that should arise related to todays visit.    Orders Placed This Encounter  Procedures   DG Chest 2 View    Meds ordered this encounter  Medications   amoxicillin-clavulanate (  AUGMENTIN) 875-125 MG tablet    Sig: Take 1 tablet by mouth 2 (two) times daily.    Dispense:  20 tablet    Refill:  0   predniSONE (DELTASONE) 10 MG tablet    Sig: Take one tab 3 x day for 3 days, then take one tab 2 x a day for 3 days and then take one tab a day for 3 days for URI.    Dispense:  18 tablet    Refill:  0    Return if symptoms worsen or fail to improve.   Total time spent:20 Minutes Time spent includes review of chart, medications, test results, and follow up plan with the patient.   [place] Controlled Substance Database was reviewed by me.  This patient was seen by Sallyanne Kuster, FNP-C in collaboration with Dr. Beverely Risen as a part of collaborative care agreement.   Montay Vanvoorhis R. Tedd Sias, MSN, FNP-C Internal medicine

## 2022-12-05 ENCOUNTER — Telehealth: Payer: Self-pay

## 2022-12-05 MED ORDER — FLUCONAZOLE 150 MG PO TABS: 150.0000 mg | ORAL_TABLET | Freq: Once | ORAL | 0 refills | Status: AC

## 2022-12-05 NOTE — Telephone Encounter (Signed)
Pt advised we sent pres for diflucan

## 2022-12-12 ENCOUNTER — Ambulatory Visit: Payer: BC Managed Care – PPO | Admitting: Urology

## 2022-12-13 ENCOUNTER — Other Ambulatory Visit (HOSPITAL_COMMUNITY)
Admission: RE | Admit: 2022-12-13 | Discharge: 2022-12-13 | Disposition: A | Discharge status: Home / Self Care | Payer: BC Managed Care – PPO | Source: Ambulatory Visit | Attending: Obstetrics | Admitting: Obstetrics

## 2022-12-13 ENCOUNTER — Ambulatory Visit: Payer: BC Managed Care – PPO | Admitting: Obstetrics

## 2022-12-13 VITALS — BP 116/70 | HR 86 | Ht 60.0 in | Wt 152.3 lb

## 2022-12-13 DIAGNOSIS — N925 Other specified irregular menstruation: Secondary | ICD-10-CM | POA: Insufficient documentation

## 2022-12-13 DIAGNOSIS — B3731 Acute candidiasis of vulva and vagina: Secondary | ICD-10-CM | POA: Insufficient documentation

## 2022-12-13 DIAGNOSIS — Z3202 Encounter for pregnancy test, result negative: Secondary | ICD-10-CM | POA: Diagnosis not present

## 2022-12-13 DIAGNOSIS — R102 Pelvic and perineal pain: Secondary | ICD-10-CM

## 2022-12-13 DIAGNOSIS — N898 Other specified noninflammatory disorders of vagina: Secondary | ICD-10-CM | POA: Insufficient documentation

## 2022-12-13 DIAGNOSIS — N926 Irregular menstruation, unspecified: Secondary | ICD-10-CM

## 2022-12-13 LAB — POCT URINE PREGNANCY: Preg Test, Ur: NEGATIVE

## 2022-12-13 NOTE — Progress Notes (Signed)
GYN ENCOUNTER  Subjective  HPI: Tina Rodgers is a 32 y.o. G1P0101 who presents today for evaluation of a bulge in her vagina. She reports that when she has to strain to void or defecate, she can feel a bulge. She has a h/o chronic constipation. She has had stress incontinence since the birth of her daughter. 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 She would like repeat testing today. She also notes that she has had missed periods and spotting while on birth control.  Past Medical History:  Diagnosis Date   ADD (attention deficit disorder)    Vitamin D deficiency    Past Surgical History:  Procedure Laterality Date   BREAST SURGERY     2 lumps removed- neg   COLONOSCOPY WITH PROPOFOL N/A 04/25/2022   Procedure: COLONOSCOPY WITH PROPOFOL;  Surgeon: Lin Landsman, MD;  Location: [facility] ENDOSCOPY;  Service: Gastroenterology;  Laterality: N/A;   OB History     Gravida  1   Para  1   Term      Preterm  1   AB      Living  1      SAB      IAB      Ectopic      Multiple  0   Live Births  1          Allergies  Allergen Reactions   Dust Mite Mixed Allergen Ext [Mite (D. Farinae)]    Misc. Sulfonamide Containing Compounds Other (See Comments)   Sulfa Antibiotics Other (See Comments)    flush    Negative except as noted in HPI History obtained from the patient  Objective  BP 116/70   Pulse 86   Ht 5' (1.524 m)   Wt 152 lb 4.8 oz (69.1 kg)   LMP 11/06/2022   BMI 29.74 kg/m   Pelvic: External genitalia normal; visible bulging of anterior wall of vagina noted with Valsalva; bulging anteriorly and posteriorly palpable on exam.  Assessment 1) Possible pelvic organ prolapse 2) Recurrent vaginitis  Plan 1) Referral to pelvic PT. Consider referral to urogynecology if improvement not achieved with PT. 2) Swabs collected. Will evaluate treatment plan based on results.  Lloyd Huger, CNM

## 2022-12-16 LAB — CERVICOVAGINAL ANCILLARY ONLY
Bacterial Vaginitis (gardnerella): NEGATIVE
Candida Glabrata: NEGATIVE
Candida Vaginitis: POSITIVE — AB
Chlamydia: NEGATIVE
Comment: NEGATIVE
Comment: NEGATIVE
Comment: NEGATIVE
Comment: NEGATIVE
Comment: NEGATIVE
Comment: NORMAL
Neisseria Gonorrhea: NEGATIVE
Trichomonas: NEGATIVE

## 2022-12-17 LAB — NUSWAB VAGINITIS PLUS (VG+)
Candida albicans, NAA: NEGATIVE
Candida glabrata, NAA: NEGATIVE
Chlamydia trachomatis, NAA: NEGATIVE
Neisseria gonorrhoeae, NAA: NEGATIVE
Trich vag by NAA: NEGATIVE

## 2022-12-18 ENCOUNTER — Other Ambulatory Visit: Payer: Self-pay | Admitting: Obstetrics

## 2022-12-18 ENCOUNTER — Encounter: Payer: Self-pay | Admitting: Obstetrics

## 2022-12-18 MED ORDER — FLUCONAZOLE 150 MG PO TABS: 150.0000 mg | ORAL_TABLET | Freq: Once | ORAL | 1 refills | Status: AC

## 2022-12-19 ENCOUNTER — Other Ambulatory Visit: Payer: BC Managed Care – PPO

## 2022-12-26 ENCOUNTER — Ambulatory Visit: Payer: BC Managed Care – PPO | Admitting: Physical Therapy

## 2023-01-02 ENCOUNTER — Telehealth: Payer: Self-pay | Admitting: Obstetrics

## 2023-01-02 ENCOUNTER — Ambulatory Visit: Payer: BC Managed Care – PPO | Admitting: Physical Therapy

## 2023-01-02 NOTE — Telephone Encounter (Signed)
I contacted patient via phone about appointment scheduled for 2/5 for imaging at [facility]. I left detailed message for patient about change due to no ultrasound tech in office. I told patient about new appointment scheduled for Monday, 2/5 at 3 pm in [place]. I advise patient to call back to office visit  with Lloyd Huger.

## 2023-01-02 NOTE — Telephone Encounter (Signed)
Patient called to confirm scheduled appointment for 2/5 at [facility] for imaging

## 2023-01-07 ENCOUNTER — Ambulatory Visit
Admission: RE | Admit: 2023-01-07 | Discharge: 2023-01-07 | Disposition: A | Discharge status: Home / Self Care | Payer: BC Managed Care – PPO | Source: Ambulatory Visit | Attending: Obstetrics | Admitting: Obstetrics

## 2023-01-07 ENCOUNTER — Other Ambulatory Visit: Payer: BC Managed Care – PPO

## 2023-01-07 DIAGNOSIS — R102 Pelvic and perineal pain: Secondary | ICD-10-CM

## 2023-01-09 ENCOUNTER — Ambulatory Visit: Payer: BC Managed Care – PPO | Admitting: Urology

## 2023-01-09 ENCOUNTER — Encounter: Payer: BC Managed Care – PPO | Admitting: Physical Therapy

## 2023-01-09 ENCOUNTER — Telehealth (INDEPENDENT_AMBULATORY_CARE_PROVIDER_SITE_OTHER): Payer: BC Managed Care – PPO | Admitting: Nurse Practitioner

## 2023-01-09 ENCOUNTER — Encounter: Payer: Self-pay | Admitting: Nurse Practitioner

## 2023-01-09 ENCOUNTER — Encounter: Payer: Self-pay | Admitting: Urology

## 2023-01-09 VITALS — BP 105/71 | HR 105 | Ht 60.0 in | Wt 150.0 lb

## 2023-01-09 VITALS — Resp 16 | Ht 60.0 in | Wt 150.0 lb

## 2023-01-09 DIAGNOSIS — R399 Unspecified symptoms and signs involving the genitourinary system: Secondary | ICD-10-CM

## 2023-01-09 DIAGNOSIS — R3914 Feeling of incomplete bladder emptying: Secondary | ICD-10-CM | POA: Diagnosis not present

## 2023-01-09 DIAGNOSIS — J029 Acute pharyngitis, unspecified: Secondary | ICD-10-CM | POA: Diagnosis not present

## 2023-01-09 DIAGNOSIS — R300 Dysuria: Secondary | ICD-10-CM | POA: Diagnosis not present

## 2023-01-09 DIAGNOSIS — Z20818 Contact with and (suspected) exposure to other bacterial communicable diseases: Secondary | ICD-10-CM | POA: Diagnosis not present

## 2023-01-09 DIAGNOSIS — T3695XA Adverse effect of unspecified systemic antibiotic, initial encounter: Secondary | ICD-10-CM | POA: Diagnosis not present

## 2023-01-09 DIAGNOSIS — B379 Candidiasis, unspecified: Secondary | ICD-10-CM

## 2023-01-09 LAB — URINALYSIS, COMPLETE
Bilirubin, UA: NEGATIVE
Glucose, UA: NEGATIVE
Ketones, UA: NEGATIVE
Leukocytes,UA: NEGATIVE
Nitrite, UA: NEGATIVE
Protein,UA: NEGATIVE
Specific Gravity, UA: 1.025 (ref 1.005–1.030)
Urobilinogen, Ur: 0.2 mg/dL (ref 0.2–1.0)
pH, UA: 6 (ref 5.0–7.5)

## 2023-01-09 LAB — MICROSCOPIC EXAMINATION: Epithelial Cells (non renal): >10 /hpf — AB (ref 0–10)

## 2023-01-09 LAB — BLADDER SCAN AMB NON-IMAGING: Scan Result: 20

## 2023-01-09 MED ORDER — FLUCONAZOLE 150 MG PO TABS: 150.0000 mg | ORAL_TABLET | Freq: Once | ORAL | 0 refills | Status: AC

## 2023-01-09 MED ORDER — AMOXICILLIN-POT CLAVULANATE 875-125 MG PO TABS: 1.0000 | ORAL_TABLET | Freq: Two times a day (BID) | ORAL | 0 refills | Status: AC

## 2023-01-09 NOTE — Progress Notes (Signed)
01/09/2023 8:11 AM   Ellison Carwin September 10, 1991 [id number]  Referring provider: Mylinda Latina, PA-C [place],  [place] [address]  Chief Complaint  Patient presents with   Other    HPI: 32 y.o. female presents for follow-up.  Initially seen 05/10/2022 Saw general surgery 05/15/2022 for abdominal pain and right upper quadrant ultrasound/pelvic MRI ordered which showed no significant abnormalities She has been diagnosed with BV x2 since last visit and urine cultures negative x2 Given a trial of Gemtesa for storage related voiding symptoms.  She did note less frequency though did have side effects including skin erythema She has noted dietary triggers to her symptoms including caffeine and cantaloupe which she has avoided She has dysuria typically with the first morning void which improves as the day progresses.  She does take Azo on occasions which helps  Since her last visit in July 2023 she has been doing well Notes occasional sensation of incomplete emptying Recent GYN evaluation negative for prolapse  PMH: Past Medical History:  Diagnosis Date   ADD (attention deficit disorder)    Vitamin D deficiency     Surgical History: Past Surgical History:  Procedure Laterality Date   BREAST SURGERY     2 lumps removed- neg   COLONOSCOPY WITH PROPOFOL N/A 04/25/2022   Procedure: COLONOSCOPY WITH PROPOFOL;  Surgeon: Lin Landsman, MD;  Location: [facility] ENDOSCOPY;  Service: Gastroenterology;  Laterality: N/A;    Home Medications:  Allergies as of 01/09/2023       Reactions   Dust Mite Mixed Allergen Ext [mite (d. Farinae)]    Misc. Sulfonamide Containing Compounds Other (See Comments)   Sulfa Antibiotics Other (See Comments)   flush        Medication List        Accurate as of January 09, 2023  8:11 AM. If you have any questions, ask your nurse or doctor.          STOP taking these medications    amoxicillin-clavulanate 875-125 MG  tablet Commonly known as: AUGMENTIN Stopped by: Abbie Sons, MD   chlorpheniramine-HYDROcodone 10-8 MG/5ML Commonly known as: TUSSIONEX Stopped by: Abbie Sons, MD   polyethylene glycol 17 g packet Commonly known as: MiraLax Stopped by: Abbie Sons, MD   predniSONE 10 MG tablet Commonly known as: DELTASONE Stopped by: Abbie Sons, MD       TAKE these medications    cholecalciferol 25 MCG (1000 UNIT) tablet Commonly known as: VITAMIN D3 Take 1,000 Units by mouth daily.   desogestrel-ethinyl estradiol 0.15-0.02/0.01 MG (21/5) tablet Commonly known as: Mircette Take 1 tablet by mouth at bedtime.   fluticasone 50 MCG/ACT nasal spray Commonly known as: FLONASE Place 2 sprays into both nostrils daily.   metroNIDAZOLE 0.75 % vaginal gel Commonly known as: METROGEL VAGINAL Place 1 Applicatorful vaginally at bedtime. Once daily x 7 days then 2 time week for 6 months   ONE-A-DAY WOMENS PO Take by mouth.   sertraline 50 MG tablet Commonly known as: ZOLOFT TAKE 1 TABLET BY MOUTH EVERY DAY        Allergies:  Allergies  Allergen Reactions   Dust Mite Mixed Allergen Ext [Mite (D. Farinae)]    Misc. Sulfonamide Containing Compounds Other (See Comments)   Sulfa Antibiotics Other (See Comments)    flush    Family History: Family History  Problem Relation Age of Onset   Ovarian cancer Paternal Grandmother    Atrial fibrillation Father  Breast cancer Neg Hx    Colon cancer Neg Hx    Diabetes Neg Hx     Social History:  reports that she has never smoked. She has never used smokeless tobacco. She reports current alcohol use. She reports that she does not use drugs.   Physical Exam: There were no vitals taken for this visit.  Constitutional:  Alert and oriented, No acute distress. HEENT: [place] AT Respiratory: Normal respiratory effort, no increased work of breathing. Psychiatric: Normal mood and affect.  Laboratory Data:  Urinalysis Dipstick  negative Microscopy negative   Assessment & Plan:   History dysuria and lower urinary tract symptoms which has improved with avoidance of dietary triggers Sensation of incomplete emptying and PVR today was 20 mL We discussed annual versus prn follow-up and she elected the former.  She will also call for an earlier appointment for worsening voiding symptoms.  1 year follow-up scheduled    Abbie Sons, MD  [facility] [address], [place], [place] [address] [phone]

## 2023-01-09 NOTE — Progress Notes (Unsigned)
[facility] [place], [place] [address]  Internal MEDICINE  Telephone Visit  Patient Name: Tina Rodgers  [id number]  [id number]  Date of Service: 01/09/2023  I connected with the patient at 1245 by telephone and verified the patients identity using two identifiers.   I discussed the limitations, risks, security and privacy concerns of performing an evaluation and management service by telephone and the availability of in person appointments. I also discussed with the patient that there may be a patient responsible charge related to the service.  The patient expressed understanding and agrees to proceed.    Chief Complaint  Patient presents with   Telephone Screen    Sore throat [phone]   Telephone Assessment   Sore Throat    HPI Tina Rodgers presents for a telehealth virtual visit for sore throat 2 days ago -- sore throat  Ear pains Covid negative Has been exposed to strep.    Current Medication: Outpatient Encounter Medications as of 01/09/2023  Medication Sig   cholecalciferol (VITAMIN D3) 25 MCG (1000 UNIT) tablet Take 1,000 Units by mouth daily.   desogestrel-ethinyl estradiol (MIRCETTE) 0.15-0.02/0.01 MG (21/5) tablet Take 1 tablet by mouth at bedtime.   fluticasone (FLONASE) 50 MCG/ACT nasal spray Place 2 sprays into both nostrils daily.   metroNIDAZOLE (METROGEL VAGINAL) 0.75 % vaginal gel Place 1 Applicatorful vaginally at bedtime. Once daily x 7 days then 2 time week for 6 months   Multiple Vitamins-Minerals (ONE-A-DAY WOMENS PO) Take by mouth.   sertraline (ZOLOFT) 50 MG tablet TAKE 1 TABLET BY MOUTH EVERY DAY   amoxicillin-clavulanate (AUGMENTIN) 875-125 MG tablet Take 1 tablet by mouth 2 (two) times daily for 10 days. Take with food   fluconazole (DIFLUCAN) 150 MG tablet Take 1 tablet (150 mg total) by mouth once for 1 dose. May take an additional dose after 3 days if still symptomatic.   No facility-administered encounter  medications on file as of 01/09/2023.    Surgical History: Past Surgical History:  Procedure Laterality Date   BREAST SURGERY     2 lumps removed- neg   COLONOSCOPY WITH PROPOFOL N/A 04/25/2022   Procedure: COLONOSCOPY WITH PROPOFOL;  Surgeon: Lin Landsman, MD;  Location: [facility] ENDOSCOPY;  Service: Gastroenterology;  Laterality: N/A;    Medical History: Past Medical History:  Diagnosis Date   ADD (attention deficit disorder)    Vitamin D deficiency     Family History: Family History  Problem Relation Age of Onset   Ovarian cancer Paternal Grandmother    Atrial fibrillation Father    Breast cancer Neg Hx    Colon cancer Neg Hx    Diabetes Neg Hx     Social History   Socioeconomic History   Marital status: Single    Spouse name: Not on file   Number of children: Not on file   Years of education: Not on file   Highest education level: Not on file  Occupational History   Not on file  Tobacco Use   Smoking status: Never   Smokeless tobacco: Never  Vaping Use   Vaping Use: Never used  Substance and Sexual Activity   Alcohol use: Yes    Comment: ocassionally   Drug use: No   Sexual activity: Yes    Birth control/protection: Condom  Other Topics Concern   Not on file  Social History Narrative   Not on file   Social Determinants of Health   Financial Resource Strain: Not on file  Food  Insecurity: Not on file  Transportation Needs: Not on file  Physical Activity: Not on file  Stress: Not on file  Social Connections: Not on file  Intimate Partner Violence: Not on file      Review of Systems  Vital Signs: Resp 16   Ht 5' (1.524 m)   Wt 150 lb (68 kg)   BMI 29.29 kg/m    Observation/Objective:     Assessment/Plan:   General Counseling: [place] verbalizes understanding of the findings of today's phone visit and agrees with plan of treatment. I have discussed any further diagnostic evaluation that may be needed or ordered today. We also  reviewed her medications today. she has been encouraged to call the office with any questions or concerns that should arise related to todays visit.  No follow-ups on file.   No orders of the defined types were placed in this encounter.   Meds ordered this encounter  Medications   amoxicillin-clavulanate (AUGMENTIN) 875-125 MG tablet    Sig: Take 1 tablet by mouth 2 (two) times daily for 10 days. Take with food    Dispense:  20 tablet    Refill:  0   fluconazole (DIFLUCAN) 150 MG tablet    Sig: Take 1 tablet (150 mg total) by mouth once for 1 dose. May take an additional dose after 3 days if still symptomatic.    Dispense:  3 tablet    Refill:  0    Time spent:*** Minutes Time spent with patient included reviewing progress notes, labs, imaging studies, and discussing plan for follow up.  [place] Controlled Substance Database was reviewed by me for overdose risk score (ORS) if appropriate.  This patient was seen by Jonetta Osgood, FNP-C in collaboration with Dr. Clayborn Bigness as a part of collaborative care agreement.  Saagar Tortorella R. Valetta Fuller, MSN, FNP-C Internal medicine

## 2023-01-12 ENCOUNTER — Other Ambulatory Visit: Payer: Self-pay | Admitting: Internal Medicine

## 2023-01-12 ENCOUNTER — Encounter: Payer: Self-pay | Admitting: Obstetrics

## 2023-01-12 DIAGNOSIS — J301 Allergic rhinitis due to pollen: Secondary | ICD-10-CM

## 2023-01-16 ENCOUNTER — Encounter: Payer: BC Managed Care – PPO | Admitting: Physical Therapy

## 2023-01-23 ENCOUNTER — Encounter: Payer: BC Managed Care – PPO | Admitting: Physical Therapy

## 2023-01-30 ENCOUNTER — Ambulatory Visit: Payer: BC Managed Care – PPO | Admitting: Physical Therapy

## 2023-02-04 ENCOUNTER — Telehealth: Payer: Self-pay | Admitting: Certified Nurse Midwife

## 2023-02-04 NOTE — Telephone Encounter (Signed)
Left message for patient to call office back to r/s her appt with Deneise Lever on 02/25/23. She will need to be scheduled sometime in April.

## 2023-02-04 NOTE — Telephone Encounter (Signed)
The patient rescheduled for 4/10 with AT

## 2023-02-06 ENCOUNTER — Encounter: Payer: BC Managed Care – PPO | Admitting: Physical Therapy

## 2023-02-07 ENCOUNTER — Ambulatory Visit (INDEPENDENT_AMBULATORY_CARE_PROVIDER_SITE_OTHER): Payer: BC Managed Care – PPO | Admitting: Physician Assistant

## 2023-02-07 ENCOUNTER — Encounter: Payer: Self-pay | Admitting: Physician Assistant

## 2023-02-07 VITALS — BP 114/65 | HR 89 | Temp 98.3°F | Resp 16 | Ht 60.0 in | Wt 154.0 lb

## 2023-02-07 DIAGNOSIS — M2540 Effusion, unspecified joint: Secondary | ICD-10-CM

## 2023-02-07 MED ORDER — PREDNISONE 10 MG PO TABS: ORAL_TABLET | ORAL | 0 refills | Status: DC

## 2023-02-07 NOTE — Progress Notes (Signed)
[facility] [place], [place] [address]  Internal MEDICINE  Office Visit Note  Patient Name: Tina Rodgers  [id number]  [id number]  Date of Service: 02/08/2023  Chief Complaint  Patient presents with   Acute Visit    Knee swelling and painful to touch, started last night. Patient has had cellulitis before and she states these symptoms feel similar.     HPI Pt is here for a sick visit. -Last night started having left knee pain and swelling. Now states both knees feel warm and swollen -Did have some irritation on bottom last night. This was itchy. Some bumps. Used some gold bond and this went away. States her husband thought it just looked like a heat rash -No bites or rash on knees. No redness or cuts. -No known injury, no fever -knees only a little painful if pressing down on it -She denies seeing any ticks or bugs, and has not seen any bite marks and has not even been outside recently -did have bed bugs a year ago, but had treatments done and nothing seen since. -Left ear has a little pressure but thinks this is just sinus drainage from weather change and otherwise feels well   Current Medication:  Outpatient Encounter Medications as of 02/07/2023  Medication Sig   cholecalciferol (VITAMIN D3) 25 MCG (1000 UNIT) tablet Take 1,000 Units by mouth daily.   desogestrel-ethinyl estradiol (MIRCETTE) 0.15-0.02/0.01 MG (21/5) tablet Take 1 tablet by mouth at bedtime.   fluticasone (FLONASE) 50 MCG/ACT nasal spray USE 2 SPRAYS IN EACH NOSTRIL EVERY DAY   metroNIDAZOLE (METROGEL VAGINAL) 0.75 % vaginal gel Place 1 Applicatorful vaginally at bedtime. Once daily x 7 days then 2 time week for 6 months   Multiple Vitamins-Minerals (ONE-A-DAY WOMENS PO) Take by mouth.   predniSONE (DELTASONE) 10 MG tablet Use per dose pack   sertraline (ZOLOFT) 50 MG tablet TAKE 1 TABLET BY MOUTH EVERY DAY   No facility-administered encounter medications on file as of  02/07/2023.      Medical History: Past Medical History:  Diagnosis Date   ADD (attention deficit disorder)    Vitamin D deficiency      Vital Signs: BP 114/65   Pulse 89   Temp 98.3 F (36.8 C)   Resp 16   Ht 5' (1.524 m)   Wt 154 lb (69.9 kg)   SpO2 98%   BMI 30.08 kg/m    Review of Systems  Constitutional:  Negative for chills, fatigue and fever.  HENT:  Positive for postnasal drip. Negative for congestion and mouth sores.   Respiratory:  Negative for cough.   Cardiovascular:  Negative for chest pain.  Genitourinary:  Negative for flank pain.  Musculoskeletal:  Positive for arthralgias.       Bilateral knee warmth and swelling  Allergic/Immunologic: Positive for environmental allergies.  Psychiatric/Behavioral: Negative.      Physical Exam Vitals reviewed.  Constitutional:      General: She is not in acute distress.    Appearance: Normal appearance. She is not ill-appearing.  HENT:     Head: Normocephalic and atraumatic.     Right Ear: Tympanic membrane normal.     Left Ear: Tympanic membrane normal.  Eyes:     Pupils: Pupils are equal, round, and reactive to light.  Cardiovascular:     Rate and Rhythm: Normal rate and regular rhythm.  Pulmonary:     Effort: Pulmonary effort is normal. No respiratory distress.  Abdominal:  Tenderness: There is no abdominal tenderness.  Musculoskeletal:        General: Tenderness present.     Comments: Possible mild swelling along top of both knees. No rash, erythema, or skin changes. Some increased warmth of both knees  Neurological:     Mental Status: She is alert and oriented to person, place, and time.  Psychiatric:        Mood and Affect: Mood normal.        Behavior: Behavior normal.       Assessment/Plan: 1. Joint swelling Will go ahead and treat with steroid taper for bilateral joint swelling and possible inflammatory reaction. Patient advised to monitor for any new or worsening symptoms and to call or  go to ED. - predniSONE (DELTASONE) 10 MG tablet; Use per dose pack  Dispense: 21 tablet; Refill: 0   General Counseling: carrin vannostrand understanding of the findings of todays visit and agrees with plan of treatment. I have discussed any further diagnostic evaluation that may be needed or ordered today. We also reviewed her medications today. she has been encouraged to call the office with any questions or concerns that should arise related to todays visit.    Counseling:    No orders of the defined types were placed in this encounter.   Meds ordered this encounter  Medications   predniSONE (DELTASONE) 10 MG tablet    Sig: Use per dose pack    Dispense:  21 tablet    Refill:  0    Time spent:30 Minutes

## 2023-02-20 ENCOUNTER — Ambulatory Visit: Payer: BC Managed Care – PPO | Admitting: Physical Therapy

## 2023-02-23 ENCOUNTER — Other Ambulatory Visit: Payer: Self-pay | Admitting: Obstetrics and Gynecology

## 2023-02-23 DIAGNOSIS — R102 Pelvic and perineal pain: Secondary | ICD-10-CM

## 2023-02-25 ENCOUNTER — Encounter: Payer: Self-pay | Admitting: Certified Nurse Midwife

## 2023-02-26 ENCOUNTER — Ambulatory Visit: Payer: BC Managed Care – PPO | Admitting: Internal Medicine

## 2023-02-26 ENCOUNTER — Other Ambulatory Visit: Payer: Self-pay

## 2023-02-26 MED ORDER — PREDNISONE 10 MG PO TABS: ORAL_TABLET | ORAL | 0 refills | Status: DC

## 2023-02-26 MED ORDER — AZITHROMYCIN 250 MG PO TABS: ORAL_TABLET | ORAL | 0 refills | Status: DC

## 2023-02-26 NOTE — Telephone Encounter (Signed)
Pt called that she is coughing,wheezing ,sob and no fever and covid test is negative and no rash she think its dry skin as per dr Humphrey Rolls send zpak and prednisone and advised her to take Mucinex for cough

## 2023-02-27 ENCOUNTER — Encounter: Payer: BC Managed Care – PPO | Admitting: Physical Therapy

## 2023-03-06 ENCOUNTER — Encounter: Payer: BC Managed Care – PPO | Admitting: Physical Therapy

## 2023-03-07 ENCOUNTER — Telehealth (INDEPENDENT_AMBULATORY_CARE_PROVIDER_SITE_OTHER): Payer: BC Managed Care – PPO | Admitting: Physician Assistant

## 2023-03-07 VITALS — Ht 65.0 in | Wt 152.0 lb

## 2023-03-07 DIAGNOSIS — J209 Acute bronchitis, unspecified: Secondary | ICD-10-CM

## 2023-03-07 MED ORDER — ALBUTEROL SULFATE HFA 108 (90 BASE) MCG/ACT IN AERS: 2.0000 | INHALATION_SPRAY | Freq: Four times a day (QID) | RESPIRATORY_TRACT | 0 refills | Status: DC | PRN

## 2023-03-07 NOTE — Progress Notes (Signed)
[facility] [place],  [address]  Internal MEDICINE  Telephone Visit  Patient Name: Tina Rodgers  [id number]  [id number]  Date of Service: 03/07/2023  I connected with the patient at 4:13 by telephone and verified the patients identity using two identifiers.   I discussed the limitations, risks, security and privacy concerns of performing an evaluation and management service by telephone and the availability of in person appointments. I also discussed with the patient that there may be a patient responsible charge related to the service.  The patient expressed understanding and agrees to proceed.    Chief Complaint  Patient presents with   Telephone Assessment    [phone]   Telephone Screen   Cough   Wheezing   Shortness of Breath    HPI Pt is here for routine follow up -Last few weeks has been coughing, comes in spells -Mainly when she takes a deep breath in and triggers cough, chest is hurting from coughing -better than it was, was SOB previously but doing better now -Rattling/raspy more than wheezing, right before coughing -Some drainage, no sinus pressure -prednisone completed yesterday, ABX completed sat  Current Medication: Outpatient Encounter Medications as of 03/07/2023  Medication Sig   albuterol (VENTOLIN HFA) 108 (90 Base) MCG/ACT inhaler Inhale 2 puffs into the lungs every 6 (six) hours as needed for wheezing or shortness of breath.   cholecalciferol (VITAMIN D3) 25 MCG (1000 UNIT) tablet Take 1,000 Units by mouth daily.   fluticasone (FLONASE) 50 MCG/ACT nasal spray USE 2 SPRAYS IN EACH NOSTRIL EVERY DAY   KARIVA 0.15-0.02/0.01 MG (21/5) tablet TAKE 1 TABLET BY MOUTH EVERYDAY AT BEDTIME   metroNIDAZOLE (METROGEL VAGINAL) 0.75 % vaginal gel Place 1 Applicatorful vaginally at bedtime. Once daily x 7 days then 2 time week for 6 months   Multiple Vitamins-Minerals (ONE-A-DAY WOMENS PO) Take by mouth.   sertraline (ZOLOFT)  50 MG tablet TAKE 1 TABLET BY MOUTH EVERY DAY   [DISCONTINUED] azithromycin (ZITHROMAX) 250 MG tablet Use  as directed for 5 days   [DISCONTINUED] predniSONE (DELTASONE) 10 MG tablet Take 1 tab po 3 x day for 3 days then take 1 tab po 2 x a day for 3 days and then take 1 tab po daily for 3 days   No facility-administered encounter medications on file as of 03/07/2023.    Surgical History: Past Surgical History:  Procedure Laterality Date   BREAST SURGERY     2 lumps removed- neg   COLONOSCOPY WITH PROPOFOL N/A 04/25/2022   Procedure: COLONOSCOPY WITH PROPOFOL;  Surgeon: Lin Landsman, MD;  Location: [facility] ENDOSCOPY;  Service: Gastroenterology;  Laterality: N/A;    Medical History: Past Medical History:  Diagnosis Date   ADD (attention deficit disorder)    Vitamin D deficiency     Family History: Family History  Problem Relation Age of Onset   Ovarian cancer Paternal Grandmother    Atrial fibrillation Father    Breast cancer Neg Hx    Colon cancer Neg Hx    Diabetes Neg Hx     Social History   Socioeconomic History   Marital status: Single    Spouse name: Not on file   Number of children: Not on file   Years of education: Not on file   Highest education level: Not on file  Occupational History   Not on file  Tobacco Use   Smoking status: Never   Smokeless tobacco: Never  Vaping Use  Vaping Use: Never used  Substance and Sexual Activity   Alcohol use: Yes    Comment: ocassionally   Drug use: No   Sexual activity: Yes    Birth control/protection: Condom  Other Topics Concern   Not on file  Social History Narrative   Not on file   Social Determinants of Health   Financial Resource Strain: Not on file  Food Insecurity: Not on file  Transportation Needs: Not on file  Physical Activity: Not on file  Stress: Not on file  Social Connections: Not on file  Intimate Partner Violence: Not on file      Review of Systems  Constitutional:  Negative for  fatigue and fever.  HENT:  Positive for postnasal drip. Negative for congestion, mouth sores and sinus pressure.   Respiratory:  Positive for cough, shortness of breath and wheezing.   Cardiovascular:  Negative for chest pain.  Genitourinary:  Negative for flank pain.  Psychiatric/Behavioral: Negative.      Vital Signs: Ht 5\' 5"  (1.651 m)   Wt 152 lb (68.9 kg)   BMI 25.29 kg/m    Observation/Objective:  Pt is able to carry out conversation   Assessment/Plan: 1. Acute bronchitis, unspecified organism Improving after ABX and prednisone,but wheezing persists. Will continue nasal spray and antihistamine and may try albuterol prn. If not improving will order chest xray. - albuterol (VENTOLIN HFA) 108 (90 Base) MCG/ACT inhaler; Inhale 2 puffs into the lungs every 6 (six) hours as needed for wheezing or shortness of breath.  Dispense: 8 g; Refill: 0   General Counseling: Lawona verbalizes understanding of the findings of today's phone visit and agrees with plan of treatment. I have discussed any further diagnostic evaluation that may be needed or ordered today. We also reviewed her medications today. she has been encouraged to call the office with any questions or concerns that should arise related to todays visit.    No orders of the defined types were placed in this encounter.   Meds ordered this encounter  Medications   albuterol (VENTOLIN HFA) 108 (90 Base) MCG/ACT inhaler    Sig: Inhale 2 puffs into the lungs every 6 (six) hours as needed for wheezing or shortness of breath.    Dispense:  8 g    Refill:  0    Time spent:25 Minutes    Dr Lavera Guise Internal medicine

## 2023-03-12 ENCOUNTER — Telehealth: Payer: Self-pay

## 2023-03-12 NOTE — Telephone Encounter (Signed)
Lmom to call us back 

## 2023-03-13 ENCOUNTER — Encounter: Payer: BC Managed Care – PPO | Admitting: Physical Therapy

## 2023-03-13 ENCOUNTER — Ambulatory Visit (INDEPENDENT_AMBULATORY_CARE_PROVIDER_SITE_OTHER): Payer: BC Managed Care – PPO | Admitting: Nurse Practitioner

## 2023-03-13 ENCOUNTER — Encounter: Payer: Self-pay | Admitting: Certified Nurse Midwife

## 2023-03-13 ENCOUNTER — Ambulatory Visit (INDEPENDENT_AMBULATORY_CARE_PROVIDER_SITE_OTHER): Payer: BC Managed Care – PPO | Admitting: Certified Nurse Midwife

## 2023-03-13 ENCOUNTER — Encounter: Payer: Self-pay | Admitting: Nurse Practitioner

## 2023-03-13 VITALS — BP 117/75 | HR 94 | Temp 98.4°F | Resp 16 | Ht 61.0 in | Wt 156.2 lb

## 2023-03-13 VITALS — BP 119/75 | HR 89 | Ht 60.0 in | Wt 155.8 lb

## 2023-03-13 DIAGNOSIS — L2389 Allergic contact dermatitis due to other agents: Secondary | ICD-10-CM

## 2023-03-13 DIAGNOSIS — N889 Noninflammatory disorder of cervix uteri, unspecified: Secondary | ICD-10-CM

## 2023-03-13 DIAGNOSIS — J208 Acute bronchitis due to other specified organisms: Secondary | ICD-10-CM

## 2023-03-13 DIAGNOSIS — Z01419 Encounter for gynecological examination (general) (routine) without abnormal findings: Secondary | ICD-10-CM | POA: Diagnosis not present

## 2023-03-13 DIAGNOSIS — B9689 Other specified bacterial agents as the cause of diseases classified elsewhere: Secondary | ICD-10-CM

## 2023-03-13 DIAGNOSIS — N760 Acute vaginitis: Secondary | ICD-10-CM

## 2023-03-13 DIAGNOSIS — R102 Pelvic and perineal pain: Secondary | ICD-10-CM

## 2023-03-13 MED ORDER — DOXYCYCLINE HYCLATE 100 MG PO TABS
100.0000 mg | ORAL_TABLET | Freq: Two times a day (BID) | ORAL | 0 refills | Status: DC
Start: 2023-03-13 — End: 2023-04-04

## 2023-03-13 MED ORDER — PREDNISONE 10 MG (21) PO TBPK
ORAL_TABLET | ORAL | 0 refills | Status: DC
Start: 2023-03-13 — End: 2023-04-04

## 2023-03-13 MED ORDER — DESOGESTREL-ETHINYL ESTRADIOL 0.15-0.02/0.01 MG (21/5) PO TABS
1.0000 | ORAL_TABLET | Freq: Every day | ORAL | 3 refills | Status: DC
Start: 2023-03-13 — End: 2024-02-05

## 2023-03-13 MED ORDER — TRIAMCINOLONE ACETONIDE 0.1 % EX CREA
1.0000 | TOPICAL_CREAM | Freq: Two times a day (BID) | CUTANEOUS | 2 refills | Status: DC
Start: 2023-03-13 — End: 2023-04-04

## 2023-03-13 NOTE — Progress Notes (Signed)
[facility] [address] [place], [place] [address]  Internal MEDICINE  Office Visit Note  Patient Name: Tina Rodgers  [id number]  [id number]  Date of Service: 03/13/2023  Chief Complaint  Patient presents with   Acute Visit   Cough     HPI Kawsar presents for an acute sick visit for cough --onset was 2 weeks ago -deep cough, coughing fits, sometimes is able to produce sputum.  Chest congestion and rattling.  Negative for covid   Also has a rash on her inner thigh bilaterally. It is red, maculopapular. Sometimes itchy. Likely from friction. No new lotions, soaps or detergents. No excessive sweating while wearing jeans or pants.       Current Medication:  Outpatient Encounter Medications as of 03/13/2023  Medication Sig   albuterol (VENTOLIN HFA) 108 (90 Base) MCG/ACT inhaler Inhale 2 puffs into the lungs every 6 (six) hours as needed for wheezing or shortness of breath.   cholecalciferol (VITAMIN D3) 25 MCG (1000 UNIT) tablet Take 1,000 Units by mouth daily.   desogestrel-ethinyl estradiol (KARIVA) 0.15-0.02/0.01 MG (21/5) tablet Take 1 tablet by mouth daily.   doxycycline (VIBRA-TABS) 100 MG tablet Take 1 tablet (100 mg total) by mouth 2 (two) times daily.   fluticasone (FLONASE) 50 MCG/ACT nasal spray USE 2 SPRAYS IN EACH NOSTRIL EVERY DAY   metroNIDAZOLE (METROGEL VAGINAL) 0.75 % vaginal gel Place 1 Applicatorful vaginally at bedtime. Once daily x 7 days then 2 time week for 6 months   Multiple Vitamins-Minerals (ONE-A-DAY WOMENS PO) Take by mouth.   NON FORMULARY Vaginal probiotic tablet ( one tablet daily)   predniSONE (STERAPRED UNI-PAK 21 TAB) 10 MG (21) TBPK tablet Use as directed for 6 days   sertraline (ZOLOFT) 50 MG tablet TAKE 1 TABLET BY MOUTH EVERY DAY   triamcinolone cream (KENALOG) 0.1 % Apply 1 Application topically 2 (two) times daily.   No facility-administered encounter medications on file as of 03/13/2023.      Medical  History: Past Medical History:  Diagnosis Date   ADD (attention deficit disorder)    Vitamin D deficiency      Vital Signs: BP 117/75   Pulse 94   Temp 98.4 F (36.9 C)   Resp 16   Ht 5\' 1"  (1.549 m)   Wt 156 lb 3.2 oz (70.9 kg)   LMP 03/01/2023   SpO2 97%   BMI 29.51 kg/m    Review of Systems  Constitutional:  Positive for fatigue. Negative for chills and fever.  HENT:  Positive for sore throat. Negative for congestion, postnasal drip, rhinorrhea, sinus pressure, sinus pain, sneezing and trouble swallowing.   Respiratory:  Positive for cough, chest tightness and shortness of breath. Negative for wheezing.   Cardiovascular:  Negative for chest pain and palpitations.  Gastrointestinal:  Negative for diarrhea, nausea and vomiting.  Skin:  Positive for rash.  Neurological:  Positive for headaches.    Physical Exam Vitals reviewed.  Constitutional:      General: She is not in acute distress.    Appearance: Normal appearance. She is ill-appearing.  HENT:     Head: Normocephalic and atraumatic.     Right Ear: Tympanic membrane, ear canal and external ear normal.     Left Ear: Tympanic membrane, ear canal and external ear normal.     Nose: Congestion present.     Mouth/Throat:     Mouth: Mucous membranes are moist.     Pharynx: Posterior oropharyngeal erythema present.  Eyes:  Pupils: Pupils are equal, round, and reactive to light.  Cardiovascular:     Rate and Rhythm: Normal rate and regular rhythm.  Pulmonary:     Effort: Pulmonary effort is normal. No accessory muscle usage or respiratory distress.     Breath sounds: Normal breath sounds and air entry. No wheezing.  Skin:    Findings: Rash present. Rash is macular and papular.          Comments: Marked area is location of itchy maculopapular red rash.   Neurological:     Mental Status: She is alert and oriented to person, place, and time.  Psychiatric:        Mood and Affect: Mood normal.        Behavior:  Behavior normal.       Assessment/Plan: 1. Acute bacterial bronchitis Antibiotic  and prednisone taper prescribed to tx infection.  - predniSONE (STERAPRED UNI-PAK 21 TAB) 10 MG (21) TBPK tablet; Use as directed for 6 days  Dispense: 21 tablet; Refill: 0 - doxycycline (VIBRA-TABS) 100 MG tablet; Take 1 tablet (100 mg total) by mouth 2 (two) times daily.  Dispense: 20 tablet; Refill: 0  2. Allergic contact dermatitis due to other agents Triamcinolone cream ordered, apply as prescribed until rash resolves. Call clinic after a couple weeks if no improvement in rash - triamcinolone cream (KENALOG) 0.1 %; Apply 1 Application topically 2 (two) times daily.  Dispense: 45 g; Refill: 2   General Counseling: Juleah verbalizes understanding of the findings of todays visit and agrees with plan of treatment. I have discussed any further diagnostic evaluation that may be needed or ordered today. We also reviewed her medications today. she has been encouraged to call the office with any questions or concerns that should arise related to todays visit.    Counseling:    No orders of the defined types were placed in this encounter.   Meds ordered this encounter  Medications   predniSONE (STERAPRED UNI-PAK 21 TAB) 10 MG (21) TBPK tablet    Sig: Use as directed for 6 days    Dispense:  21 tablet    Refill:  0   doxycycline (VIBRA-TABS) 100 MG tablet    Sig: Take 1 tablet (100 mg total) by mouth 2 (two) times daily.    Dispense:  20 tablet    Refill:  0   triamcinolone cream (KENALOG) 0.1 %    Sig: Apply 1 Application topically 2 (two) times daily.    Dispense:  45 g    Refill:  2    Return if symptoms worsen or fail to improve.  [place] Cont[place] Substance Database was reviewed by me for overdose risk score (ORS)  Time spent:30 Minutes Time spent with patient included reviewing progress notes, labs, imaging studies, and discussing plan for follow up.   This patient was seen by Sallyanne Kuster, FNP-C in collaboration with Dr. Beverely Risen as a part of collaborative care agreement.  Rhylin Venters R. Tedd Sias, MSN, FNP-C Internal Medicine

## 2023-03-13 NOTE — Patient Instructions (Signed)

## 2023-03-13 NOTE — Progress Notes (Signed)
GYNECOLOGY ANNUAL PREVENTATIVE CARE ENCOUNTER NOTE  History:     Tina Rodgers is a 32 y.o. G37P0101 female here for a routine annual gynecologic exam.  Current complaints: none.   Denies abnormal vaginal bleeding, discharge, pelvic pain, problems with intercourse or other gynecologic concerns.     Social Relationship:married  Living: spouse and child  Work: Geologist, engineering Exercise:  none  Smoke/Alcohol/drug use:  Gynecologic History No LMP recorded. Contraception: OCP (estrogen/progesterone) Last Pap: 05/03/2022. Results were: normal with negative HPV Last mammogram: n/a .  Obstetric History OB History  Gravida Para Term Preterm AB Living  1 1   1   1   SAB IAB Ectopic Multiple Live Births        0 1    # Outcome Date GA Lbr Len/2nd Weight Sex Delivery Anes PTL Lv  1 Preterm 07/15/18 [redacted]w[redacted]d 05:05 / 03:10 6 lb 5.6 oz (2.88 kg) F Vag-Spont Local  LIV    Past Medical History:  Diagnosis Date   ADD (attention deficit disorder)    Vitamin D deficiency     Past Surgical History:  Procedure Laterality Date   BREAST SURGERY     2 lumps removed- neg   COLONOSCOPY WITH PROPOFOL N/A 04/25/2022   Procedure: COLONOSCOPY WITH PROPOFOL;  Surgeon: Toney Reil, MD;  Location: [facility] ENDOSCOPY;  Service: Gastroenterology;  Laterality: N/A;    Current Outpatient Medications on File Prior to Visit  Medication Sig Dispense Refill   albuterol (VENTOLIN HFA) 108 (90 Base) MCG/ACT inhaler Inhale 2 puffs into the lungs every 6 (six) hours as needed for wheezing or shortness of breath. 8 g 0   cholecalciferol (VITAMIN D3) 25 MCG (1000 UNIT) tablet Take 1,000 Units by mouth daily.     fluticasone (FLONASE) 50 MCG/ACT nasal spray USE 2 SPRAYS IN EACH NOSTRIL EVERY DAY 48 mL 4   KARIVA 0.15-0.02/0.01 MG (21/5) tablet TAKE 1 TABLET BY MOUTH EVERYDAY AT BEDTIME 84 tablet 1   metroNIDAZOLE (METROGEL VAGINAL) 0.75 % vaginal gel Place 1 Applicatorful vaginally at bedtime.  Once daily x 7 days then 2 time week for 6 months 70 g 6   Multiple Vitamins-Minerals (ONE-A-DAY WOMENS PO) Take by mouth.     sertraline (ZOLOFT) 50 MG tablet TAKE 1 TABLET BY MOUTH EVERY DAY 90 tablet 2   No current facility-administered medications on file prior to visit.    Allergies  Allergen Reactions   Dust Mite Mixed Allergen Ext [Mite (D. Farinae)]    Misc. Sulfonamide Containing Compounds Other (See Comments)   Sulfa Antibiotics Other (See Comments)    flush    Social History:  reports that she has never smoked. She has never used smokeless tobacco. She reports current alcohol use. She reports that she does not use drugs.  Family History  Problem Relation Age of Onset   Ovarian cancer Paternal Grandmother    Atrial fibrillation Father    Breast cancer Neg Hx    Colon cancer Neg Hx    Diabetes Neg Hx     The following portions of the patient's history were reviewed and updated as appropriate: allergies, current medications, past family history, past medical history, past social history, past surgical history and problem list.  Review of Systems Pertinent items noted in HPI and remainder of comprehensive ROS otherwise negative.  Physical Exam:  There were no vitals taken for this visit. CONSTITUTIONAL: Well-developed, well-nourished female in no acute distress.  HENT:  Normocephalic, atraumatic,  External right and left ear normal. Oropharynx is clear and moist EYES: Conjunctivae and EOM are normal. Pupils are equal, round, and reactive to light. No scleral icterus.  NECK: Normal range of motion, supple, no masses.  Normal thyroid.  SKIN: Skin is warm and dry. No rash noted. Not diaphoretic. No erythema. No pallor. MUSCULOSKELETAL: Normal range of motion. No tenderness.  No cyanosis, clubbing, or edema.  2+ distal pulses. NEUROLOGIC: Alert and oriented to person, place, and time. Normal reflexes, muscle tone coordination.  PSYCHIATRIC: Normal mood and affect. Normal  behavior. Normal judgment and thought content. CARDIOVASCULAR: Normal heart rate noted, regular rhythm RESPIRATORY: Clear to auscultation bilaterally. Effort and breath sounds normal, no problems with respiration noted. BREASTS: Symmetric in size. No masses, tenderness, skin changes, nipple drainage, or lymphadenopathy bilaterally.  ABDOMEN: Soft, no distention noted.  No tenderness, rebound or guarding.  PELVIC: Normal appearing external genitalia and urethral meatus; normal appearing vaginal mucosa. Cervix appears red, no tenderness on exam .  No abnormal discharge noted.  Pap smear not due.  Normal uterine size, no other palpable masses, no uterine or adnexal tenderness.  .has history recurrent vaginitis . Swab collected   Assessment and Plan:    1. Women's annual routine gynecological examination  Pap: not due  Mammogram : n/a  Labs:  swab Refills: OCP  Referral: none  Pre conceptions counseling completed.  Routine preventative health maintenance measures emphasized. Please refer to After Visit Summary for other counseling recommendations.      Doreene Burke, CNM [facility]  [facility],  [facility]

## 2023-03-16 LAB — NUSWAB VAGINITIS PLUS (VG+)
Candida albicans, NAA: NEGATIVE
Candida glabrata, NAA: NEGATIVE
Chlamydia trachomatis, NAA: NEGATIVE
Neisseria gonorrhoeae, NAA: NEGATIVE
Trich vag by NAA: NEGATIVE

## 2023-03-21 ENCOUNTER — Encounter: Payer: BC Managed Care – PPO | Admitting: Physical Therapy

## 2023-03-25 ENCOUNTER — Encounter: Payer: Self-pay | Admitting: Certified Nurse Midwife

## 2023-04-04 ENCOUNTER — Other Ambulatory Visit (HOSPITAL_COMMUNITY)
Admission: RE | Admit: 2023-04-04 | Discharge: 2023-04-04 | Disposition: A | Discharge status: Home / Self Care | Payer: BC Managed Care – PPO | Source: Ambulatory Visit | Attending: Certified Nurse Midwife | Admitting: Certified Nurse Midwife

## 2023-04-04 ENCOUNTER — Ambulatory Visit (INDEPENDENT_AMBULATORY_CARE_PROVIDER_SITE_OTHER): Payer: BC Managed Care – PPO

## 2023-04-04 ENCOUNTER — Encounter: Payer: Self-pay | Admitting: Certified Nurse Midwife

## 2023-04-04 ENCOUNTER — Other Ambulatory Visit: Payer: Self-pay | Admitting: Physician Assistant

## 2023-04-04 VITALS — BP 108/57 | HR 87 | Resp 16 | Ht 60.0 in | Wt 154.1 lb

## 2023-04-04 DIAGNOSIS — N898 Other specified noninflammatory disorders of vagina: Secondary | ICD-10-CM

## 2023-04-04 DIAGNOSIS — R1030 Lower abdominal pain, unspecified: Secondary | ICD-10-CM

## 2023-04-04 DIAGNOSIS — J209 Acute bronchitis, unspecified: Secondary | ICD-10-CM

## 2023-04-04 LAB — POCT URINALYSIS DIPSTICK
Bilirubin, UA: NEGATIVE
Blood, UA: NEGATIVE
Glucose, UA: NEGATIVE
Ketones, UA: NEGATIVE
Leukocytes, UA: NEGATIVE
Nitrite, UA: NEGATIVE
Protein, UA: NEGATIVE
Spec Grav, UA: 1.025 (ref 1.010–1.025)
Urobilinogen, UA: 0.2 E.U./dL
pH, UA: 5 (ref 5.0–8.0)

## 2023-04-04 NOTE — Progress Notes (Signed)
    NURSE VISIT NOTE  Subjective:    [name], female    DOB: January 22, 1991, 32 y.o.   MRN: [id number]  HPI  Patient is a 32 y.o. G15P0101 female who presents for white, mucoid, and thick vaginal discharge for 1 week(s). Denies abnormal vaginal bleeding or significant pelvic pain or fever. admits to dysuria, flank pain, abdominal pain, and vaginal discharge. Patient denies history of known exposure to STD.   Objective:    LMP 03/01/2023    @THIS  VISIT ONLY@  Assessment:   1. Vaginal discharge   2. Vaginal odor     bacterial vaginosis  Plan:   GC and chlamydia DNA  probe sent to lab. Treatment: Will determine after results are in. ROV prn if symptoms persist or worsen.   Santiago Bumpers, CMA [facility]

## 2023-04-04 NOTE — Patient Instructions (Incomplete)

## 2023-04-08 LAB — CERVICOVAGINAL ANCILLARY ONLY
Bacterial Vaginitis (gardnerella): NEGATIVE
Candida Glabrata: NEGATIVE
Candida Vaginitis: NEGATIVE
Chlamydia: NEGATIVE
Comment: NEGATIVE
Comment: NEGATIVE
Comment: NEGATIVE
Comment: NEGATIVE
Comment: NEGATIVE
Comment: NORMAL
Neisseria Gonorrhea: NEGATIVE
Trichomonas: NEGATIVE

## 2023-04-18 ENCOUNTER — Encounter: Payer: Self-pay | Admitting: Nurse Practitioner

## 2023-04-18 ENCOUNTER — Ambulatory Visit (INDEPENDENT_AMBULATORY_CARE_PROVIDER_SITE_OTHER): Payer: BC Managed Care – PPO | Admitting: Nurse Practitioner

## 2023-04-18 VITALS — BP 120/82 | HR 99 | Temp 97.6°F | Resp 16 | Ht 61.0 in | Wt 155.8 lb

## 2023-04-18 DIAGNOSIS — N390 Urinary tract infection, site not specified: Secondary | ICD-10-CM

## 2023-04-18 DIAGNOSIS — N3001 Acute cystitis with hematuria: Secondary | ICD-10-CM | POA: Diagnosis not present

## 2023-04-18 DIAGNOSIS — R300 Dysuria: Secondary | ICD-10-CM | POA: Diagnosis not present

## 2023-04-18 LAB — POCT URINALYSIS DIPSTICK
Bilirubin, UA: NEGATIVE
Glucose, UA: NEGATIVE
Nitrite, UA: NEGATIVE
Protein, UA: POSITIVE — AB
Spec Grav, UA: 1.01 (ref 1.010–1.025)
Urobilinogen, UA: 0.2 E.U./dL
pH, UA: 7 (ref 5.0–8.0)

## 2023-04-18 MED ORDER — CIPROFLOXACIN HCL 500 MG PO TABS: 500.0000 mg | ORAL_TABLET | Freq: Two times a day (BID) | ORAL | 0 refills | Status: DC

## 2023-04-18 MED ORDER — NITROFURANTOIN MONOHYD MACRO 100 MG PO CAPS
100.0000 mg | ORAL_CAPSULE | Freq: Two times a day (BID) | ORAL | 0 refills | Status: AC
Start: 2023-04-18 — End: 2023-04-25

## 2023-04-18 NOTE — Progress Notes (Signed)
[facility] [address] [place], [place] [address]  Internal MEDICINE  Office Visit Note  Patient Name: Tina Rodgers  [id number]  [id number]  Date of Service: 04/18/2023  Chief Complaint  Patient presents with   Acute Visit    uti     HPI Tina Rodgers presents for an acute sick visit for symptoms of UTI Burning with urination, chills, low grade fever, frequency, urgency, incomplete emptying of bladder,back pain.  Symptoms started yesterday Urinalysis showed trace blood and moderate leukocytes. Nitrites are negative.   Current Medication:  Outpatient Encounter Medications as of 04/18/2023  Medication Sig   albuterol (VENTOLIN HFA) 108 (90 Base) MCG/ACT inhaler TAKE 2 PUFFS BY MOUTH EVERY 6 HOURS AS NEEDED FOR WHEEZE OR SHORTNESS OF BREATH   cholecalciferol (VITAMIN D3) 25 MCG (1000 UNIT) tablet Take 1,000 Units by mouth daily.   desogestrel-ethinyl estradiol (KARIVA) 0.15-0.02/0.01 MG (21/5) tablet Take 1 tablet by mouth daily.   fluticasone (FLONASE) 50 MCG/ACT nasal spray USE 2 SPRAYS IN EACH NOSTRIL EVERY DAY   Multiple Vitamins-Minerals (ONE-A-DAY WOMENS PO) Take by mouth.   nitrofurantoin, macrocrystal-monohydrate, (MACROBID) 100 MG capsule Take 1 capsule (100 mg total) by mouth 2 (two) times daily for 7 days.   NON FORMULARY Vaginal probiotic tablet ( one tablet daily)   sertraline (ZOLOFT) 50 MG tablet TAKE 1 TABLET BY MOUTH EVERY DAY   [DISCONTINUED] ciprofloxacin (CIPRO) 500 MG tablet Take 1 tablet (500 mg total) by mouth 2 (two) times daily for 7 days.   No facility-administered encounter medications on file as of 04/18/2023.      Medical History: Past Medical History:  Diagnosis Date   ADD (attention deficit disorder)    Vitamin D deficiency      Vital Signs: BP 120/82   Pulse 99   Temp 97.6 F (36.4 C)   Resp 16   Ht 5\' 1"  (1.549 m)   Wt 155 lb 12.8 oz (70.7 kg)   SpO2 99%   BMI 29.44 kg/m    Review of Systems   Constitutional:  Positive for chills, fatigue and fever.  Respiratory: Negative.  Negative for cough, chest tightness, shortness of breath and wheezing.   Cardiovascular:  Negative for chest pain and palpitations.  Genitourinary:  Positive for dysuria, frequency, hematuria and urgency.  Musculoskeletal:  Positive for back pain.    Physical Exam Vitals reviewed.  Constitutional:      General: She is not in acute distress.    Appearance: Normal appearance. She is not ill-appearing.  HENT:     Head: Normocephalic and atraumatic.  Eyes:     Pupils: Pupils are equal, round, and reactive to light.  Cardiovascular:     Rate and Rhythm: Normal rate and regular rhythm.  Pulmonary:     Effort: Pulmonary effort is normal. No respiratory distress.  Abdominal:     Tenderness: There is no abdominal tenderness.  Neurological:     Mental Status: She is alert and oriented to person, place, and time.  Psychiatric:        Mood and Affect: Mood normal.        Behavior: Behavior normal.       Assessment/Plan: 1. Acute cystitis with hematuria Empiric treatment prescribed with nitrofurantoin.  - POCT urinalysis dipstick - CULTURE, URINE COMPREHENSIVE - nitrofurantoin, macrocrystal-monohydrate, (MACROBID) 100 MG capsule; Take 1 capsule (100 mg total) by mouth 2 (two) times daily for 7 days.  Dispense: 14 capsule; Refill: 0  2. Dysuria Urinalysis positive for blood  and leukocytes, urine sent for culture  - POCT urinalysis dipstick - CULTURE, URINE COMPREHENSIVE   General Counseling: Tina Rodgers understanding of the findings of todays visit and agrees with plan of treatment. I have discussed any further diagnostic evaluation that may be needed or ordered today. We also reviewed her medications today. she has been encouraged to call the office with any questions or concerns that should arise related to todays visit.    Counseling:    Orders Placed This Encounter  Procedures    CULTURE, URINE COMPREHENSIVE   POCT urinalysis dipstick    Meds ordered this encounter  Medications   DISCONTD: ciprofloxacin (CIPRO) 500 MG tablet    Sig: Take 1 tablet (500 mg total) by mouth 2 (two) times daily for 7 days.    Dispense:  14 tablet    Refill:  0    Fill script today please.   nitrofurantoin, macrocrystal-monohydrate, (MACROBID) 100 MG capsule    Sig: Take 1 capsule (100 mg total) by mouth 2 (two) times daily for 7 days.    Dispense:  14 capsule    Refill:  0    Disregard cipro prescription and fill this one instead, thanks.    Return if symptoms worsen or fail to improve.  [place] Controlled Substance Database was reviewed by me for overdose risk score (ORS)  Time spent:20 Minutes Time spent with patient included reviewing progress notes, labs, imaging studies, and discussing plan for follow up.   This patient was seen by Sallyanne Kuster, FNP-C in collaboration with Dr. Beverely Risen as a part of collaborative care agreement.  Caitlan Chauca R. Tedd Sias, MSN, FNP-C Internal Medicine

## 2023-04-19 ENCOUNTER — Ambulatory Visit: Payer: BC Managed Care – PPO | Admitting: Nurse Practitioner

## 2023-04-20 LAB — CULTURE, URINE COMPREHENSIVE

## 2023-04-22 LAB — CULTURE, URINE COMPREHENSIVE

## 2023-05-30 ENCOUNTER — Encounter: Payer: BC Managed Care – PPO | Admitting: Physician Assistant

## 2023-06-03 ENCOUNTER — Ambulatory Visit (INDEPENDENT_AMBULATORY_CARE_PROVIDER_SITE_OTHER): Payer: BC Managed Care – PPO | Admitting: Physician Assistant

## 2023-06-03 ENCOUNTER — Encounter: Payer: Self-pay | Admitting: Physician Assistant

## 2023-06-03 VITALS — BP 121/70 | HR 100 | Temp 97.8°F | Resp 16 | Ht 61.0 in | Wt 154.0 lb

## 2023-06-03 DIAGNOSIS — E782 Mixed hyperlipidemia: Secondary | ICD-10-CM | POA: Diagnosis not present

## 2023-06-03 DIAGNOSIS — Z0001 Encounter for general adult medical examination with abnormal findings: Secondary | ICD-10-CM

## 2023-06-03 DIAGNOSIS — R946 Abnormal results of thyroid function studies: Secondary | ICD-10-CM | POA: Diagnosis not present

## 2023-06-03 DIAGNOSIS — R300 Dysuria: Secondary | ICD-10-CM

## 2023-06-03 DIAGNOSIS — Z91038 Other insect allergy status: Secondary | ICD-10-CM

## 2023-06-03 DIAGNOSIS — R5383 Other fatigue: Secondary | ICD-10-CM | POA: Diagnosis not present

## 2023-06-03 MED ORDER — PREDNISONE 10 MG PO TABS
ORAL_TABLET | ORAL | 0 refills | Status: DC
Start: 2023-06-03 — End: 2023-08-13

## 2023-06-03 MED ORDER — TRIAMCINOLONE ACETONIDE 0.025 % EX OINT
1.0000 | TOPICAL_OINTMENT | Freq: Two times a day (BID) | CUTANEOUS | 0 refills | Status: DC
Start: 2023-06-03 — End: 2024-10-20

## 2023-06-03 NOTE — Progress Notes (Signed)
[facility] [address] [place], [place] [address]  Internal MEDICINE  Office Visit Note  Patient Name: Tina Rodgers  [id number]  [id number]  Date of Service: 06/03/2023  Chief Complaint  Patient presents with   Annual Exam   ant bite    Left ankle     HPI Pt is here for routine health maintenance examination -went to beach last week and got bit by ant on left ankle and is now itchy and painful with some swelling. Has been trying mupirocin and benadryl at night. Reports it hurts to walk on due to swelling into foot and thinks she had an allergic reaction to bite. She states she actually saw that it was ants that bit her. -Due for routine labs -Does see OBGYN regularly  Current Medication: Outpatient Encounter Medications as of 06/03/2023  Medication Sig   albuterol (VENTOLIN HFA) 108 (90 Base) MCG/ACT inhaler TAKE 2 PUFFS BY MOUTH EVERY 6 HOURS AS NEEDED FOR WHEEZE OR SHORTNESS OF BREATH   cholecalciferol (VITAMIN D3) 25 MCG (1000 UNIT) tablet Take 1,000 Units by mouth daily.   desogestrel-ethinyl estradiol (KARIVA) 0.15-0.02/0.01 MG (21/5) tablet Take 1 tablet by mouth daily.   fluticasone (FLONASE) 50 MCG/ACT nasal spray USE 2 SPRAYS IN EACH NOSTRIL EVERY DAY   Multiple Vitamins-Minerals (ONE-A-DAY WOMENS PO) Take by mouth.   NON FORMULARY Vaginal probiotic tablet ( one tablet daily)   predniSONE (DELTASONE) 10 MG tablet Use per dose pack   sertraline (ZOLOFT) 50 MG tablet TAKE 1 TABLET BY MOUTH EVERY DAY   triamcinolone (KENALOG) 0.025 % ointment Apply 1 Application topically 2 (two) times daily.   No facility-administered encounter medications on file as of 06/03/2023.    Surgical History: Past Surgical History:  Procedure Laterality Date   BREAST SURGERY     2 lumps removed- neg   COLONOSCOPY WITH PROPOFOL N/A 04/25/2022   Procedure: COLONOSCOPY WITH PROPOFOL;  Surgeon: Toney Reil, MD;  Location: [facility] ENDOSCOPY;  Service:  Gastroenterology;  Laterality: N/A;    Medical History: Past Medical History:  Diagnosis Date   ADD (attention deficit disorder)    Vitamin D deficiency     Family History: Family History  Problem Relation Age of Onset   Ovarian cancer Paternal Grandmother    Atrial fibrillation Father    Breast cancer Neg Hx    Colon cancer Neg Hx    Diabetes Neg Hx       Review of Systems  Constitutional:  Negative for chills, fatigue and unexpected weight change.  HENT:  Positive for postnasal drip. Negative for congestion, rhinorrhea, sneezing and sore throat.   Eyes:  Negative for redness.  Respiratory:  Negative for cough, chest tightness and shortness of breath.   Cardiovascular:  Negative for chest pain and palpitations.  Gastrointestinal:  Negative for abdominal pain, constipation, diarrhea, nausea and vomiting.  Genitourinary:  Negative for dysuria and frequency.  Musculoskeletal:  Positive for joint swelling. Negative for arthralgias, back pain and neck pain.  Skin:  Positive for rash and wound.       Ant bite on left ankle with swelling and rash  Neurological: Negative.  Negative for tremors and numbness.  Hematological:  Negative for adenopathy. Does not bruise/bleed easily.  Psychiatric/Behavioral:  Negative for behavioral problems (Depression), sleep disturbance and suicidal ideas. The patient is not nervous/anxious.      Vital Signs: BP 121/70   Pulse 100   Temp 97.8 F (36.6 C)   Resp 16  Ht 5\' 1"  (1.549 m)   Wt 154 lb (69.9 kg)   SpO2 97%   BMI 29.10 kg/m    Physical Exam Vitals and nursing note reviewed.  Constitutional:      General: She is not in acute distress.    Appearance: Normal appearance. She is not ill-appearing.  HENT:     Head: Normocephalic and atraumatic.  Eyes:     Pupils: Pupils are equal, round, and reactive to light.  Cardiovascular:     Rate and Rhythm: Normal rate and regular rhythm.  Pulmonary:     Effort: Pulmonary effort is  normal. No respiratory distress.  Abdominal:     Tenderness: There is no abdominal tenderness.  Musculoskeletal:        General: Normal range of motion.  Skin:    General: Skin is warm and dry.     Findings: Lesion and rash present.     Comments: Single bite mark on lateral left ankle mild rash and mild swelling into foot  Neurological:     Mental Status: She is alert and oriented to person, place, and time.  Psychiatric:        Mood and Affect: Mood normal.        Behavior: Behavior normal.      LABS: Recent Results (from the past 2160 hour(s))  NuSwab Vaginitis Plus (VG+)     Status: None   Collection Time: 03/13/23  2:44 PM  Result Value Ref Range   Atopobium vaginae Low - 0 Score   BVAB 2 Low - 0 Score   Megasphaera 1 Low - 0 Score    Comment: Calculate total score by adding the 3 individual bacterial vaginosis (BV) marker scores together.  Total score is interpreted as follows: Total score 0-1: Indicates the absence of BV. Total score   2: Indeterminate for BV. Additional clinical                  data should be evaluated to establish a                  diagnosis. Total score 3-6: Indicates the presence of BV. This test was developed and its performance characteristics determined by [facility].  It has not been cleared or approved by the Food and Drug Administration.    Candida albicans, NAA Negative Negative   Candida glabrata, NAA Negative Negative   Trich vag by NAA Negative Negative   Chlamydia trachomatis, NAA Negative Negative   Neisseria gonorrhoeae, NAA Negative Negative  Cervicovaginal ancillary only     Status: None   Collection Time: 04/04/23  9:38 AM  Result Value Ref Range   Neisseria Gonorrhea Negative    Chlamydia Negative    Trichomonas Negative    Bacterial Vaginitis (gardnerella) Negative    Candida Vaginitis Negative    Candida Glabrata Negative    Comment      Normal Reference Range Bacterial Vaginosis - Negative   Comment Normal Reference  Ranger Chlamydia - Negative    Comment      Normal Reference Range Neisseria Gonorrhea - Negative   Comment Normal Reference Range Candida Species - Negative    Comment Normal Reference Range Candida Galbrata - Negative    Comment Normal Reference Range Trichomonas - Negative   POCT Urinalysis Dipstick     Status: Normal   Collection Time: 04/04/23  9:43 AM  Result Value Ref Range   Color, UA     Clarity, UA  Glucose, UA Negative Negative   Bilirubin, UA Negative    Ketones, UA Negative    Spec Grav, UA 1.025 1.010 - 1.025   Blood, UA Negative    pH, UA 5.0 5.0 - 8.0   Protein, UA Negative Negative   Urobilinogen, UA 0.2 0.2 or 1.0 E.U./dL   Nitrite, UA Negative    Leukocytes, UA Negative Negative   Appearance     Odor    POCT urinalysis dipstick     Status: Abnormal   Collection Time: 04/18/23  3:23 PM  Result Value Ref Range   Color, UA     Clarity, UA     Glucose, UA Negative Negative   Bilirubin, UA negative    Ketones, UA trace    Spec Grav, UA 1.010 1.010 - 1.025   Blood, UA trace    pH, UA 7.0 5.0 - 8.0   Protein, UA Positive (A) Negative   Urobilinogen, UA 0.2 0.2 or 1.0 E.U./dL   Nitrite, UA negative    Leukocytes, UA Moderate (2+) (A) Negative   Appearance     Odor    CULTURE, URINE COMPREHENSIVE     Status: Abnormal   Collection Time: 04/18/23  3:54 PM   Specimen: Urine   Urine  Result Value Ref Range   Urine Culture, Comprehensive Final report (A)    Organism ID, Bacteria Comment (A)     Comment: Escherichia coli, identified by an automated biochemical system. Cefazolin with an MIC <=16 predicts susceptibility to the oral agents cefaclor, cefdinir, cefpodoxime, cefprozil, cefuroxime, cephalexin, and loracarbef when used for therapy of uncomplicated urinary tract infections due to E. coli, Klebsiella pneumoniae, and Proteus mirabilis. Greater than 100,000 colony forming units per mL    ANTIMICROBIAL SUSCEPTIBILITY Comment     Comment:       ** S =  Susceptible; I = Intermediate; R = Resistant **                    P = Positive; N = Negative             MICS are expressed in micrograms per mL    Antibiotic                 RSLT#1    RSLT#2    RSLT#3    RSLT#4 Amoxicillin/Clavulanic Acid    S Ampicillin                     R Cefazolin                      S Cefepime                       S Ceftriaxone                    S Cefuroxime                     I Ciprofloxacin                  S Ertapenem                      S Gentamicin                     S Imipenem  S Levofloxacin                   S Meropenem                      S Nitrofurantoin                 S Piperacillin/Tazobactam        S Tetracycline                   S Tobramycin                     S Trimethoprim/Sulfa             S         Assessment/Plan: 1. Encounter for general adult medical examination with abnormal findings CPE performed, due for labs  2. 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 May use kenalog topically as needed - triamcinolone (KENALOG) 0.025 % ointment; Apply 1 Application topically 2 (two) times daily.  Dispense: 30 g; Refill: 0 - predniSONE (DELTASONE) 10 MG tablet; Use per dose pack  Dispense: 21 tablet; Refill: 0  3. Abnormal thyroid exam - TSH + free T4  4. Mixed hyperlipidemia - Lipid Panel With LDL/HDL Ratio  5. Other fatigue - CBC w/Diff/Platelet - Comprehensive metabolic panel - TSH + free T4 - Lipid Panel With LDL/HDL Ratio  6. Dysuria - UA/M w/rflx Culture, Routine   General Counseling: kaye blatnick understanding of the findings of todays visit and agrees with plan of treatment. I have discussed any further diagnostic evaluation that may be needed or ordered today. We also reviewed her medications today. she has been encouraged to call the office with any questions or concerns that should arise related to todays  visit.    Counseling:    Orders Placed This Encounter  Procedures   UA/M w/rflx Culture, Routine   CBC w/Diff/Platelet   Comprehensive metabolic panel   TSH + free T4   Lipid Panel With LDL/HDL Ratio    Meds ordered this encounter  Medications   triamcinolone (KENALOG) 0.025 % ointment    Sig: Apply 1 Application topically 2 (two) times daily.    Dispense:  30 g    Refill:  0   predniSONE (DELTASONE) 10 MG tablet    Sig: Use per dose pack    Dispense:  21 tablet    Refill:  0    This patient was seen by Lynn Ito, PA-C in collaboration with Dr. Beverely Risen as a part of collaborative care agreement.  Total time spent:35 Minutes  Time spent includes review of chart, medications, test results, and follow up plan with the patient.     Lyndon Code, MD  Internal Medicine

## 2023-06-04 LAB — UA/M W/RFLX CULTURE, ROUTINE
Bilirubin, UA: NEGATIVE
Glucose, UA: NEGATIVE
Ketones, UA: NEGATIVE
Leukocytes,UA: NEGATIVE
Nitrite, UA: NEGATIVE
Protein,UA: NEGATIVE
RBC, UA: NEGATIVE
Specific Gravity, UA: 1.018 (ref 1.005–1.030)
Urobilinogen, Ur: 0.2 mg/dL (ref 0.2–1.0)
pH, UA: 7.5 (ref 5.0–7.5)

## 2023-06-04 LAB — MICROSCOPIC EXAMINATION
Bacteria, UA: NONE SEEN
Casts: NONE SEEN /lpf
Epithelial Cells (non renal): >10 /hpf — AB (ref 0–10)

## 2023-06-10 ENCOUNTER — Ambulatory Visit (INDEPENDENT_AMBULATORY_CARE_PROVIDER_SITE_OTHER): Payer: BC Managed Care – PPO

## 2023-06-10 ENCOUNTER — Other Ambulatory Visit (HOSPITAL_COMMUNITY): Admission: RE | Admit: 2023-06-10 | Payer: BC Managed Care – PPO | Source: Ambulatory Visit

## 2023-06-10 VITALS — BP 113/76 | HR 99 | Ht 61.0 in | Wt 155.0 lb

## 2023-06-10 DIAGNOSIS — R300 Dysuria: Secondary | ICD-10-CM | POA: Diagnosis not present

## 2023-06-10 DIAGNOSIS — N898 Other specified noninflammatory disorders of vagina: Secondary | ICD-10-CM | POA: Diagnosis not present

## 2023-06-10 LAB — POCT URINALYSIS DIPSTICK
Bilirubin, UA: NEGATIVE
Blood, UA: NEGATIVE
Glucose, UA: NEGATIVE
Ketones, UA: NEGATIVE
Leukocytes, UA: NEGATIVE
Nitrite, UA: NEGATIVE
Protein, UA: POSITIVE — AB
Spec Grav, UA: 1.025 (ref 1.010–1.025)
Urobilinogen, UA: 0.2 E.U./dL
pH, UA: 6 (ref 5.0–8.0)

## 2023-06-10 NOTE — Progress Notes (Signed)
    NURSE VISIT NOTE  Subjective:    Patient ID: Anice Gannett, female    DOB: [birth year], 32 y.o.   MRN: [id number]  HPI  Patient is a 32 y.o. [id number] female who presents for malodorous vaginal smell and noticed some blood when wiping for 1 week(s).Patient admits to right abdominal pain, genital irritation, and burning with urination  . Patient denies history of known exposure to STD.   Objective:    BP 113/76   Pulse 99   Ht 5\' 1"  (1.549 m)   Wt 155 lb (70.3 kg)   LMP  (Approximate)   BMI 29.29 kg/m     Assessment:   1. Vaginal odor   2. Burning with urination      Plan:   GC and chlamydia DNA  probe sent to lab. Treatment: abstain from coitus during course of treatment ROV prn if symptoms persist or worsen.   Loney Laurence, CMA

## 2023-06-12 LAB — CERVICOVAGINAL ANCILLARY ONLY
Bacterial Vaginitis (gardnerella): NEGATIVE
Candida Glabrata: NEGATIVE
Candida Vaginitis: NEGATIVE
Chlamydia: NEGATIVE
Comment: NEGATIVE
Comment: NEGATIVE
Comment: NEGATIVE
Comment: NEGATIVE
Comment: NEGATIVE
Comment: NORMAL
Neisseria Gonorrhea: NEGATIVE
Trichomonas: NEGATIVE

## 2023-06-12 LAB — URINE CULTURE

## 2023-06-13 LAB — CBC WITH DIFFERENTIAL/PLATELET
Basophils Absolute: 0.1 10*3/uL (ref 0.0–0.2)
Basos: 1 %
EOS (ABSOLUTE): 0.2 10*3/uL (ref 0.0–0.4)
Eos: 1 %
Hematocrit: 42.4 % (ref 34.0–46.6)
Hemoglobin: 14.1 g/dL (ref 11.1–15.9)
Immature Grans (Abs): 0.2 10*3/uL — ABNORMAL HIGH (ref 0.0–0.1)
Immature Granulocytes: 2 %
Lymphocytes Absolute: 4.5 10*3/uL — ABNORMAL HIGH (ref 0.7–3.1)
Lymphs: 43 %
MCH: 30 pg (ref 26.6–33.0)
MCHC: 33.3 g/dL (ref 31.5–35.7)
MCV: 90 fL (ref 79–97)
Monocytes Absolute: 0.6 10*3/uL (ref 0.1–0.9)
Monocytes: 6 %
Neutrophils Absolute: 5 10*3/uL (ref 1.4–7.0)
Neutrophils: 47 %
Platelets: 342 10*3/uL (ref 150–450)
RBC: 4.7 x10E6/uL (ref 3.77–5.28)
RDW: 12.3 % (ref 11.7–15.4)
WBC: 10.6 10*3/uL (ref 3.4–10.8)

## 2023-06-13 LAB — COMPREHENSIVE METABOLIC PANEL
ALT: 17 IU/L (ref 0–32)
AST: 16 IU/L (ref 0–40)
Albumin: 4.1 g/dL (ref 3.9–4.9)
Alkaline Phosphatase: 44 IU/L (ref 44–121)
BUN/Creatinine Ratio: 14 (ref 9–23)
BUN: 11 mg/dL (ref 6–20)
Bilirubin Total: 0.3 mg/dL (ref 0.0–1.2)
CO2: 25 mmol/L (ref 20–29)
Calcium: 9.6 mg/dL (ref 8.7–10.2)
Chloride: 100 mmol/L (ref 96–106)
Creatinine, Ser: 0.79 mg/dL (ref 0.57–1.00)
Globulin, Total: 2.7 g/dL (ref 1.5–4.5)
Glucose: 91 mg/dL (ref 70–99)
Potassium: 4.1 mmol/L (ref 3.5–5.2)
Sodium: 139 mmol/L (ref 134–144)
Total Protein: 6.8 g/dL (ref 6.0–8.5)
eGFR: 102 mL/min/{1.73_m2} (ref >59)

## 2023-06-13 LAB — LIPID PANEL WITH LDL/HDL RATIO
Cholesterol, Total: 192 mg/dL (ref 100–199)
HDL: 52 mg/dL (ref >39)
LDL Chol Calc (NIH): 104 mg/dL — ABNORMAL HIGH (ref 0–99)
LDL/HDL Ratio: 2 ratio (ref 0.0–3.2)
Triglycerides: 212 mg/dL — ABNORMAL HIGH (ref 0–149)
VLDL Cholesterol Cal: 36 mg/dL (ref 5–40)

## 2023-06-13 LAB — TSH+FREE T4
Free T4: 1.28 ng/dL (ref 0.82–1.77)
TSH: 2.2 u[IU]/mL (ref 0.450–4.500)

## 2023-06-14 ENCOUNTER — Encounter: Payer: Self-pay | Admitting: Physician Assistant

## 2023-06-20 ENCOUNTER — Ambulatory Visit (INDEPENDENT_AMBULATORY_CARE_PROVIDER_SITE_OTHER): Payer: BC Managed Care – PPO | Admitting: Physician Assistant

## 2023-06-20 ENCOUNTER — Encounter: Payer: Self-pay | Admitting: Physician Assistant

## 2023-06-20 ENCOUNTER — Telehealth: Payer: Self-pay

## 2023-06-20 ENCOUNTER — Other Ambulatory Visit: Payer: Self-pay | Admitting: Internal Medicine

## 2023-06-20 VITALS — BP 120/70 | HR 98 | Temp 98.0°F | Resp 16 | Ht 61.0 in | Wt 152.8 lb

## 2023-06-20 DIAGNOSIS — R109 Unspecified abdominal pain: Secondary | ICD-10-CM | POA: Diagnosis not present

## 2023-06-20 DIAGNOSIS — N390 Urinary tract infection, site not specified: Secondary | ICD-10-CM

## 2023-06-20 DIAGNOSIS — D72820 Lymphocytosis (symptomatic): Secondary | ICD-10-CM | POA: Diagnosis not present

## 2023-06-20 DIAGNOSIS — R300 Dysuria: Secondary | ICD-10-CM

## 2023-06-20 DIAGNOSIS — F411 Generalized anxiety disorder: Secondary | ICD-10-CM

## 2023-06-20 LAB — POCT URINE PREGNANCY: Preg Test, Ur: NEGATIVE

## 2023-06-20 LAB — POCT URINALYSIS DIPSTICK
Bilirubin, UA: NEGATIVE
Blood, UA: NEGATIVE
Glucose, UA: NEGATIVE
Ketones, UA: POSITIVE
Nitrite, UA: NEGATIVE
Protein, UA: NEGATIVE
Spec Grav, UA: 1.01 (ref 1.010–1.025)
Urobilinogen, UA: 0.2 E.U./dL
pH, UA: 5 (ref 5.0–8.0)

## 2023-06-20 NOTE — Progress Notes (Signed)
[facility] [address] [place], [place] [address]  Internal MEDICINE  Office Visit Note  Patient Name: Tina Rodgers  [id number]  [id number]  Date of Service: 06/26/2023  Chief Complaint  Patient presents with   Follow-up    Review labs    HPI Pt is here for follow up to review labs -Labs show lipids a little elevated. CBC showed elevated lymphocytes, but was otherwise normal and pt did recently have reaction to ant bites in addition to UTI symptoms now. Will monitor -Burns with urination and is cloudy and having some low back pain/abdominal pain -denies N/V/D or change in appetite  Current Medication: Outpatient Encounter Medications as of 06/20/2023  Medication Sig   albuterol (VENTOLIN HFA) 108 (90 Base) MCG/ACT inhaler TAKE 2 PUFFS BY MOUTH EVERY 6 HOURS AS NEEDED FOR WHEEZE OR SHORTNESS OF BREATH   cholecalciferol (VITAMIN D3) 25 MCG (1000 UNIT) tablet Take 1,000 Units by mouth daily.   desogestrel-ethinyl estradiol (KARIVA) 0.15-0.02/0.01 MG (21/5) tablet Take 1 tablet by mouth daily.   fluticasone (FLONASE) 50 MCG/ACT nasal spray USE 2 SPRAYS IN EACH NOSTRIL EVERY DAY   Multiple Vitamins-Minerals (ONE-A-DAY WOMENS PO) Take by mouth.   NON FORMULARY Vaginal probiotic tablet ( one tablet daily)   predniSONE (DELTASONE) 10 MG tablet Use per dose pack   sertraline (ZOLOFT) 50 MG tablet TAKE 1 TABLET BY MOUTH EVERY DAY   triamcinolone (KENALOG) 0.025 % ointment Apply 1 Application topically 2 (two) times daily.   No facility-administered encounter medications on file as of 06/20/2023.    Surgical History: Past Surgical History:  Procedure Laterality Date   BREAST SURGERY     2 lumps removed- neg   COLONOSCOPY WITH PROPOFOL N/A 04/25/2022   Procedure: COLONOSCOPY WITH PROPOFOL;  Surgeon: Toney Reil, MD;  Location: [facility] ENDOSCOPY;  Service: Gastroenterology;  Laterality: N/A;    Medical History: Past Medical History:  Diagnosis Date    ADD (attention deficit disorder)    Vitamin D deficiency     Family History: Family History  Problem Relation Age of Onset   Ovarian cancer Paternal Grandmother    Atrial fibrillation Father    Breast cancer Neg Hx    Colon cancer Neg Hx    Diabetes Neg Hx     Social History   Socioeconomic History   Marital status: Single    Spouse name: Not on file   Number of children: Not on file   Years of education: Not on file   Highest education level: Not on file  Occupational History   Not on file  Tobacco Use   Smoking status: Never   Smokeless tobacco: Never  Vaping Use   Vaping status: Never Used  Substance and Sexual Activity   Alcohol use: Yes    Comment: ocassionally   Drug use: No   Sexual activity: Yes    Birth control/protection: Condom  Other Topics Concern   Not on file  Social History Narrative   Not on file   Social Determinants of Health   Financial Resource Strain: Not on file  Food Insecurity: Not on file  Transportation Needs: Not on file  Physical Activity: Not on file  Stress: Not on file  Social Connections: Not on file  Intimate Partner Violence: Not on file      Review of Systems  Constitutional:  Negative for fatigue and fever.  HENT:  Negative for congestion, mouth sores and postnasal drip.   Respiratory:  Negative for cough.  Cardiovascular:  Negative for chest pain.  Gastrointestinal:  Positive for abdominal pain. Negative for diarrhea, nausea and vomiting.  Genitourinary:  Positive for dysuria, flank pain, frequency and urgency.  Psychiatric/Behavioral: Negative.      Vital Signs: BP 120/70   Pulse 98 Comment: 110  Temp 98 F (36.7 C)   Resp 16   Ht 5\' 1"  (1.549 m)   Wt 152 lb 12.8 oz (69.3 kg)   SpO2 97%   BMI 28.87 kg/m    Physical Exam Vitals reviewed.  Constitutional:      General: She is not in acute distress.    Appearance: Normal appearance. She is not ill-appearing.  HENT:     Head: Normocephalic and  atraumatic.  Eyes:     Pupils: Pupils are equal, round, and reactive to light.  Cardiovascular:     Rate and Rhythm: Normal rate and regular rhythm.  Pulmonary:     Effort: Pulmonary effort is normal. No respiratory distress.  Abdominal:     Tenderness: There is abdominal tenderness. There is no guarding or rebound.  Skin:    General: Skin is warm and dry.  Neurological:     Mental Status: She is alert and oriented to person, place, and time.  Psychiatric:        Mood and Affect: Mood normal.        Behavior: Behavior normal.        Assessment/Plan: 1. Urinary tract infection without hematuria, site unspecified Will send for culture and treat accordingly - CULTURE, URINE COMPREHENSIVE  2. Abdominal pain, unspecified abdominal location Will send for urine culture and treat accordingly. Advised to call or go to ED if new or worsening symptoms arise. Pregnancy test negative - POCT urine pregnancy  3. Elevated lymphocytes - CBC w/Diff/Platelet  4. Dysuria - POCT Urinalysis Dipstick   General Counseling: adeja sarratt understanding of the findings of todays visit and agrees with plan of treatment. I have discussed any further diagnostic evaluation that may be needed or ordered today. We also reviewed her medications today. she has been encouraged to call the office with any questions or concerns that should arise related to todays visit.    Orders Placed This Encounter  Procedures   CULTURE, URINE COMPREHENSIVE   CBC w/Diff/Platelet   POCT Urinalysis Dipstick   POCT urine pregnancy    No orders of the defined types were placed in this encounter.   This patient was seen by Lynn Ito, PA-C in collaboration with Dr. Beverely Risen as a part of collaborative care agreement.   Total time spent:30 Minutes Time spent includes review of chart, medications, test results, and follow up plan with the patient.      Dr Lyndon Code Internal medicine

## 2023-06-20 NOTE — Telephone Encounter (Signed)
Pt called for UA advised her showed leukocytes we send for culture no further treatment until culture came back and also advised her take cranberry tab and drink plenty of water

## 2023-06-24 ENCOUNTER — Telehealth: Payer: Self-pay

## 2023-06-24 ENCOUNTER — Other Ambulatory Visit: Payer: Self-pay | Admitting: Physician Assistant

## 2023-06-24 ENCOUNTER — Telehealth: Payer: Self-pay | Admitting: Gastroenterology

## 2023-06-24 DIAGNOSIS — N3000 Acute cystitis without hematuria: Secondary | ICD-10-CM

## 2023-06-24 LAB — CULTURE, URINE COMPREHENSIVE

## 2023-06-24 MED ORDER — AMOXICILLIN-POT CLAVULANATE 875-125 MG PO TABS: 1.0000 | ORAL_TABLET | Freq: Two times a day (BID) | ORAL | 0 refills | Status: DC

## 2023-06-24 NOTE — Telephone Encounter (Signed)
Patient has not been seen since 04/11/2022 in the office. Patient is having upper abdominal pain mainly that comes and go through out the day. The pain pain is a sharp pain. The pain has been going on for 1 week. Has been having nausea, bloating and constipation. Last bowel movement was yesterday but every other day it is hard bowel movements and the other days they are normal. She states that they did just found e coli in her urine. She states she did make a appointment to see [place] on 08/13/2023 but wants to know if there is anything else she can do till then

## 2023-06-24 NOTE — Telephone Encounter (Signed)
Patient verbalized understanding of instruction  

## 2023-06-24 NOTE — Telephone Encounter (Signed)
UTI present, augmentin sent

## 2023-06-24 NOTE — Telephone Encounter (Signed)
PT has appt. On 08/13/2023 having stomach pains would like a stool test before appt please return call

## 2023-06-24 NOTE — Telephone Encounter (Signed)
She can try MiraLAX 1 capful with large cup of water daily for constipation and looks like she was on Medrol pack recently.  She can try over-the-counter Prilosec 1-2 times daily before meals  RV

## 2023-06-25 NOTE — Telephone Encounter (Signed)
done

## 2023-06-25 NOTE — Telephone Encounter (Signed)
Pt.notified

## 2023-07-05 ENCOUNTER — Encounter: Payer: Self-pay | Admitting: Physician Assistant

## 2023-07-11 ENCOUNTER — Telehealth: Payer: Self-pay

## 2023-07-11 ENCOUNTER — Ambulatory Visit: Payer: BC Managed Care – PPO | Admitting: Obstetrics and Gynecology

## 2023-07-11 NOTE — Telephone Encounter (Signed)
-----   Message from Carlean Jews sent at 07/10/2023  2:30 PM EDT ----- Please let her know that her lymphocyte count is back to normal range

## 2023-07-11 NOTE — Telephone Encounter (Signed)
Pt.notified

## 2023-08-13 ENCOUNTER — Encounter: Payer: Self-pay | Admitting: Gastroenterology

## 2023-08-13 ENCOUNTER — Ambulatory Visit: Payer: BC Managed Care – PPO | Admitting: Gastroenterology

## 2023-08-13 VITALS — BP 123/79 | HR 89 | Temp 97.5°F | Ht 61.0 in | Wt 153.2 lb

## 2023-08-13 DIAGNOSIS — G8929 Other chronic pain: Secondary | ICD-10-CM

## 2023-08-13 DIAGNOSIS — R1031 Right lower quadrant pain: Secondary | ICD-10-CM

## 2023-08-13 DIAGNOSIS — K5900 Constipation, unspecified: Secondary | ICD-10-CM

## 2023-08-13 MED ORDER — HYOSCYAMINE SULFATE 0.125 MG SL SUBL: 0.1250 mg | SUBLINGUAL_TABLET | Freq: Three times a day (TID) | SUBLINGUAL | 0 refills | Status: DC | PRN

## 2023-08-13 NOTE — Progress Notes (Unsigned)
Arlyss Repress, MD [address]  [address]  [place], [place] [address]  Main: [phone]  Fax: [phone]    Gastroenterology Consultation  Referring Provider:     Carlean Jews, PA* Primary Care Physician:  Carlean Jews, PA-C Primary Gastroenterologist:  Dr. Arlyss Repress Reason for Consultation: Right lower quadrant pain, abdominal bloating        HPI:   Nichole Gioia is a 32 y.o. female referred by Carlean Jews, PA-C  for consultation & management of 1 week history of loose stools, 1 episode of blood seen on the surface of the stool, associated with right lower quadrant discomfort and abdominal bloating.  Patient reports that she was treated with Augmentin for ear infection about a month ago.  She is recently started on metronidazole, started taking it yesterday and reports that abdominal bloating is somewhat better.  She underwent CBC, CMP, lipase which were unremarkable.  CT abdomen and pelvis with contrast was unremarkable.  Urine pregnancy test was negative.  Patient reports that she is scheduled for H. pylori breath test on Friday.  Patient was in normal state of health about a week ago.  She reports eating out about twice a week.  She does not smoke or drink alcohol.  She chews gum regularly.  She does report subjective fever and nausea  Follow-up visit 08/13/2023 Santina Evans underwent workup for her symptoms of right lower quadrant pain, diarrhea. Stool studies were negative for infection, fecal calprotectin levels were normal.  Colonoscopy was unremarkable. Her symptoms improved after the colonoscopy.  She now returns with right lower quadrant pain associated with nausea, bloating and constipation.  She called our office with these symptoms on 06/24/2023, recommended to start taking MiraLAX and try over-the-counter Prilosec for upper abdominal discomfort because she was on Medrol pack.  She reports that her upper abdominal pain has resolved.   Continues to have right lower quadrant pain and her constipation has improved.  NSAIDs: None  Antiplts/Anticoagulants/Anti thrombotics: None  GI Procedures:  Colonoscopy 04/25/2022 - The examined portion of the ileum was normal. - The entire examined colon is normal. - The distal rectum and anal verge are normal on retroflexion view. - No specimens collected.  She denies family history of IBD  Past Medical History:  Diagnosis Date   ADD (attention deficit disorder)    Vitamin D deficiency     Past Surgical History:  Procedure Laterality Date   BREAST SURGERY     2 lumps removed- neg   COLONOSCOPY WITH PROPOFOL N/A 04/25/2022   Procedure: COLONOSCOPY WITH PROPOFOL;  Surgeon: Toney Reil, MD;  Location: [facility] ENDOSCOPY;  Service: Gastroenterology;  Laterality: N/A;   Current Outpatient Medications:    albuterol (VENTOLIN HFA) 108 (90 Base) MCG/ACT inhaler, TAKE 2 PUFFS BY MOUTH EVERY 6 HOURS AS NEEDED FOR WHEEZE OR SHORTNESS OF BREATH, Disp: 18 each, Rfl: 3   cholecalciferol (VITAMIN D3) 25 MCG (1000 UNIT) tablet, Take 1,000 Units by mouth daily., Disp: , Rfl:    desogestrel-ethinyl estradiol (KARIVA) 0.15-0.02/0.01 MG (21/5) tablet, Take 1 tablet by mouth daily., Disp: 84 tablet, Rfl: 3   fluticasone (FLONASE) 50 MCG/ACT nasal spray, USE 2 SPRAYS IN EACH NOSTRIL EVERY DAY, Disp: 48 mL, Rfl: 4   hyoscyamine (LEVSIN/SL) 0.125 MG SL tablet, Place 1 tablet (0.125 mg total) under the tongue every 8 (eight) hours as needed., Disp: 30 tablet, Rfl: 0   Multiple Vitamins-Minerals (ONE-A-DAY WOMENS PO), Take by mouth., Disp: ,  Rfl:    sertraline (ZOLOFT) 50 MG tablet, TAKE 1 TABLET BY MOUTH EVERY DAY, Disp: 90 tablet, Rfl: 2   triamcinolone (KENALOG) 0.025 % ointment, Apply 1 Application topically 2 (two) times daily., Disp: 30 g, Rfl: 0   NON FORMULARY, Vaginal probiotic tablet ( one tablet daily) (Patient not taking: Reported on 08/13/2023), Disp: , Rfl:   Family History  Problem  Relation Age of Onset   Ovarian cancer Paternal Grandmother    Atrial fibrillation Father    Breast cancer Neg Hx    Colon cancer Neg Hx    Diabetes Neg Hx      Social History   Tobacco Use   Smoking status: Never   Smokeless tobacco: Never  Vaping Use   Vaping status: Never Used  Substance Use Topics   Alcohol use: Yes    Comment: ocassionally   Drug use: No    Allergies as of 08/13/2023 - Review Complete 06/20/2023  Allergen Reaction Noted   Dust mite mixed allergen ext [mite (d. farinae)]  02/19/2022   Misc. sulfonamide containing compounds Other (See Comments) 06/19/2022   Sulfa antibiotics Other (See Comments)     Review of Systems:    All systems reviewed and negative except where noted in HPI.   Physical Exam:  BP 123/79 (BP Location: Left Arm, Patient Position: Sitting, Cuff Size: Normal)   Pulse 89   Temp (!) 97.5 F (36.4 C) (Oral)   Ht 5\' 1"  (1.549 m)   Wt 153 lb 4 oz (69.5 kg)   BMI 28.96 kg/m  No LMP recorded. (Menstrual status: Oral contraceptives).  General:   Alert,  Well-developed, well-nourished, pleasant and cooperative in NAD Head:  Normocephalic and atraumatic. Eyes:  Sclera clear, no icterus.   Conjunctiva pink. Ears:  Normal auditory acuity. Nose:  No deformity, discharge, or lesions. Mouth:  No deformity or lesions,oropharynx pink & moist. Neck:  Supple; no masses or thyromegaly. Lungs:  Respirations even and unlabored.  Clear throughout to auscultation.   No wheezes, crackles, or rhonchi. No acute distress. Heart:  Regular rate and rhythm; no murmurs, clicks, rubs, or gallops. Abdomen:  Normal bowel sounds. Soft, mild right lower quadrant tenderness without masses, hepatosplenomegaly or hernias noted.  No guarding or rebound tenderness.   Rectal: Not performed Msk:  Symmetrical without gross deformities. Good, equal movement & strength bilaterally. Pulses:  Normal pulses noted. Extremities:  No clubbing or edema.  No  cyanosis. Neurologic:  Alert and oriented x3;  grossly normal neurologically. Skin:  Intact without significant lesions or rashes. No jaundice. Psych:  Alert and cooperative. Normal mood and affect.  Imaging Studies: Reviewed  Assessment and Plan:   Dariyan Thammavong is a 32 y.o. female with no significant past medical history seen in consultation for right lower quadrant pain, history of constipation Constipation has currently improved Colonoscopy was unremarkable including TI evaluation Recommend trial of Levsin, prescription sent   Follow up based on the above work-up   Arlyss Repress, MD

## 2023-10-11 ENCOUNTER — Telehealth: Payer: Self-pay | Admitting: Nurse Practitioner

## 2023-10-11 MED ORDER — HYDROCOD POLI-CHLORPHE POLI ER 10-8 MG/5ML PO SUER: 5.0000 mL | Freq: Two times a day (BID) | ORAL | 0 refills | Status: DC | PRN

## 2023-10-11 MED ORDER — AZITHROMYCIN 250 MG PO TABS: ORAL_TABLET | ORAL | 0 refills | Status: AC

## 2023-10-11 MED ORDER — PREDNISONE 10 MG (21) PO TBPK: ORAL_TABLET | ORAL | 0 refills | Status: DC

## 2023-10-14 NOTE — Telephone Encounter (Signed)
Error

## 2024-01-10 ENCOUNTER — Ambulatory Visit: Payer: BC Managed Care – PPO | Admitting: Urology

## 2024-01-20 ENCOUNTER — Ambulatory Visit: Payer: Self-pay | Admitting: Nurse Practitioner

## 2024-02-02 ENCOUNTER — Other Ambulatory Visit: Payer: Self-pay | Admitting: Internal Medicine

## 2024-02-02 DIAGNOSIS — J301 Allergic rhinitis due to pollen: Secondary | ICD-10-CM

## 2024-02-03 ENCOUNTER — Ambulatory Visit: Payer: BC Managed Care – PPO | Admitting: Urology

## 2024-02-05 ENCOUNTER — Ambulatory Visit: Payer: BC Managed Care – PPO | Admitting: Urology

## 2024-02-05 ENCOUNTER — Encounter: Payer: Self-pay | Admitting: Urology

## 2024-02-05 VITALS — BP 96/64 | HR 101 | Ht 60.0 in | Wt 152.0 lb

## 2024-02-05 DIAGNOSIS — R399 Unspecified symptoms and signs involving the genitourinary system: Secondary | ICD-10-CM | POA: Diagnosis not present

## 2024-02-05 LAB — BLADDER SCAN AMB NON-IMAGING: Scan Result: 31

## 2024-02-05 MED ORDER — MIRABEGRON ER 25 MG PO TB24: 25.0000 mg | ORAL_TABLET | Freq: Every day | ORAL | 0 refills | Status: DC

## 2024-02-05 NOTE — Progress Notes (Signed)
 I, Tina Rodgers, acting as a scribe for Tina Altes, MD., have documented all relevant documentation on the behalf of Tina Altes, MD, as directed by Tina Altes, MD while in the presence of Tina Altes, MD.  02/05/2024 11:29 AM   Alita Chyle 12/13/1990 [id number]  Referring provider: Carlean Jews, PA-C [address] [place],  [place] [address]  Chief Complaint  Patient presents with   Other   Urologic history 1.  Dysuria Improved with avoidance of dietary triggers.    2.  Lower urinary tract symptoms Improved with dietary trigger avoidance Prelief  HPI: Tina Rodgers is a 33 y.o. female presents for annual follow-up visit.  Stable symptoms since last year's visit She is using Prelief for bladder pain related to dietary triggers.  Still has frequency urgency with occasional episodes of urge incontinence. Approximately 2 months ago she was having some right back pain, which has been intermittent and mild.  She had improvement in her voiding symptoms on Gemtesa, however had a side effect of skin erythema.   PMH: Past Medical History:  Diagnosis Date   ADD (attention deficit disorder)    Vitamin D deficiency     Surgical History: Past Surgical History:  Procedure Laterality Date   BREAST SURGERY     2 lumps removed- neg   COLONOSCOPY WITH PROPOFOL N/A 04/25/2022   Procedure: COLONOSCOPY WITH PROPOFOL;  Surgeon: Toney Reil, MD;  Location: [facility] ENDOSCOPY;  Service: Gastroenterology;  Laterality: N/A;    Home Medications:  Allergies as of 02/05/2024       Reactions   Dust Mite Mixed Allergen Ext [mite (d. Farinae)]    Misc. Sulfonamide Containing Compounds Other (See Comments)   Sulfa Antibiotics Other (See Comments)   flush        Medication List        Accurate as of February 05, 2024 11:29 AM. If you have any questions, ask your nurse or doctor.          STOP taking these medications     desogestrel-ethinyl estradiol 0.15-0.02/0.01 MG (21/5) tablet Commonly known as: Patent attorney Stopped by: Tina Rodgers   hyoscyamine 0.125 MG SL tablet Commonly known as: Levsin/SL Stopped by: Tina Rodgers   predniSONE 10 MG (21) Tbpk tablet Commonly known as: STERAPRED UNI-PAK 21 TAB Stopped by: Tina Rodgers       TAKE these medications    albuterol 108 (90 Base) MCG/ACT inhaler Commonly known as: VENTOLIN HFA TAKE 2 PUFFS BY MOUTH EVERY 6 HOURS AS NEEDED FOR WHEEZE OR SHORTNESS OF BREATH   chlorpheniramine-HYDROcodone 10-8 MG/5ML Commonly known as: TUSSIONEX Take 5 mLs by mouth every 12 (twelve) hours as needed for cough.   cholecalciferol 25 MCG (1000 UNIT) tablet Commonly known as: VITAMIN D3 Take 1,000 Units by mouth daily.   fluticasone 50 MCG/ACT nasal spray Commonly known as: FLONASE SPRAY 2 SPRAYS INTO EACH NOSTRIL EVERY DAY   mirabegron ER 25 MG Tb24 tablet Commonly known as: MYRBETRIQ Take 1 tablet (25 mg total) by mouth daily. Started by: Tina Rodgers   NON FORMULARY Vaginal probiotic tablet ( one tablet daily)   ONE-A-DAY WOMENS PO Take by mouth.   sertraline 50 MG tablet Commonly known as: ZOLOFT TAKE 1 TABLET BY MOUTH EVERY DAY   triamcinolone 0.025 % ointment Commonly known as: KENALOG Apply 1 Application topically 2 (two) times daily.        Allergies:  Allergies  Allergen Reactions   Dust Mite Mixed Allergen Ext [Mite (D. Farinae)]    Misc. Sulfonamide Containing Compounds Other (See Comments)   Sulfa Antibiotics Other (See Comments)    flush    Family History: Family History  Problem Relation Age of Onset   Ovarian cancer Paternal Grandmother    Atrial fibrillation Father    Breast cancer Neg Hx    Colon cancer Neg Hx    Diabetes Neg Hx     Social History:  reports that she has never smoked. She has never used smokeless tobacco. She reports current alcohol use. She reports that she does not use  drugs.   Physical Exam: BP 96/64   Pulse (!) 101   Ht 5' (1.524 m)   Wt 152 lb (68.9 kg)   BMI 29.69 kg/m   Constitutional:  Alert and oriented, No acute distress. HEENT: [place] AT Respiratory: Normal respiratory effort, no increased work of breathing. Psychiatric: Normal mood and affect.   Assessment & Plan:    1. Lower urinary tract symptoms We discussed trial of anticholinergic medication, however she does have chronic constipation and does not desire a trial Discussed a trial of Myrbetriq and she was interested in pursuing, and will start at the 25 mg dose.  PVR today was 31 mL Also discussed a trial of low dose amitriptyline for her bladder pain and voiding symptoms and she will try Myrbetriq initially.   I have reviewed the above documentation for accuracy and completeness, and I agree with the above.   Tina Altes, MD  [facility] [address] [place], [place] [address] [phone]

## 2024-03-05 ENCOUNTER — Other Ambulatory Visit: Payer: Self-pay | Admitting: Urology

## 2024-03-18 ENCOUNTER — Encounter: Payer: Self-pay | Admitting: Nurse Practitioner

## 2024-03-18 ENCOUNTER — Ambulatory Visit (INDEPENDENT_AMBULATORY_CARE_PROVIDER_SITE_OTHER): Payer: Self-pay | Admitting: Nurse Practitioner

## 2024-03-18 VITALS — BP 112/76 | HR 91 | Temp 98.2°F | Resp 16 | Ht 60.0 in | Wt 151.6 lb

## 2024-03-18 DIAGNOSIS — R319 Hematuria, unspecified: Secondary | ICD-10-CM | POA: Diagnosis not present

## 2024-03-18 DIAGNOSIS — N390 Urinary tract infection, site not specified: Secondary | ICD-10-CM

## 2024-03-18 DIAGNOSIS — R300 Dysuria: Secondary | ICD-10-CM

## 2024-03-18 DIAGNOSIS — N3001 Acute cystitis with hematuria: Secondary | ICD-10-CM | POA: Diagnosis not present

## 2024-03-18 LAB — POCT URINALYSIS DIPSTICK
Bilirubin, UA: NEGATIVE
Glucose, UA: NEGATIVE
Ketones, UA: NEGATIVE
Leukocytes, UA: NEGATIVE
Nitrite, UA: NEGATIVE
Protein, UA: NEGATIVE
Spec Grav, UA: 1.01 (ref 1.010–1.025)
Urobilinogen, UA: 0.2 U/dL
pH, UA: 6 (ref 5.0–8.0)

## 2024-03-18 MED ORDER — NITROFURANTOIN MONOHYD MACRO 100 MG PO CAPS: 100.0000 mg | ORAL_CAPSULE | Freq: Two times a day (BID) | ORAL | 0 refills | Status: AC

## 2024-03-18 NOTE — Progress Notes (Signed)
 [facility] [address] [place], [place] [address]  Internal MEDICINE  Office Visit Note  Patient Name: Tina Rodgers  [id number]  [id number]  Date of Service: 03/18/2024  Chief Complaint  Patient presents with   Acute Visit    uti     HPI Tina Rodgers presents for an acute sick visit for possible UTI.  --onset of symptoms was about 3-4 days ago.  Reports back pain, burning with urination, some blood in urine, urgency, and frequency and urinary discomfort, pelvic pressure.  -- urinalysis is positive for trace blood, and negative for nitrites and leukocytes.      Current Medication:  Outpatient Encounter Medications as of 03/18/2024  Medication Sig   albuterol (VENTOLIN HFA) 108 (90 Base) MCG/ACT inhaler TAKE 2 PUFFS BY MOUTH EVERY 6 HOURS AS NEEDED FOR WHEEZE OR SHORTNESS OF BREATH   chlorpheniramine-HYDROcodone (TUSSIONEX) 10-8 MG/5ML Take 5 mLs by mouth every 12 (twelve) hours as needed for cough.   cholecalciferol (VITAMIN D3) 25 MCG (1000 UNIT) tablet Take 1,000 Units by mouth daily.   fluticasone (FLONASE) 50 MCG/ACT nasal spray SPRAY 2 SPRAYS INTO EACH NOSTRIL EVERY DAY   mirabegron ER (MYRBETRIQ) 25 MG TB24 tablet TAKE 1 TABLET (25 MG TOTAL) BY MOUTH DAILY.   Multiple Vitamins-Minerals (ONE-A-DAY WOMENS PO) Take by mouth.   nitrofurantoin, macrocrystal-monohydrate, (MACROBID) 100 MG capsule Take 1 capsule (100 mg total) by mouth 2 (two) times daily for 7 days. Take with food   NON FORMULARY Vaginal probiotic tablet ( one tablet daily)   sertraline (ZOLOFT) 50 MG tablet TAKE 1 TABLET BY MOUTH EVERY DAY   triamcinolone (KENALOG) 0.025 % ointment Apply 1 Application topically 2 (two) times daily.   No facility-administered encounter medications on file as of 03/18/2024.      Medical History: Past Medical History:  Diagnosis Date   ADD (attention deficit disorder)    Vitamin D deficiency      Vital Signs: BP 112/76   Pulse 91   Temp 98.2 F  (36.8 C)   Resp 16   Ht 5' (1.524 m)   Wt 151 lb 9.6 oz (68.8 kg)   SpO2 98%   BMI 29.61 kg/m    Review of Systems  Constitutional:  Positive for fatigue.  HENT: Negative.    Respiratory: Negative.  Negative for cough, chest tightness, shortness of breath and wheezing.   Cardiovascular:  Negative for chest pain and palpitations.  Gastrointestinal: Negative.   Genitourinary:  Positive for difficulty urinating, dysuria, flank pain, frequency, hematuria, pelvic pain and urgency.  Musculoskeletal:  Positive for back pain.    Physical Exam Vitals reviewed.  Constitutional:      General: She is not in acute distress.    Appearance: Normal appearance. She is not ill-appearing.  HENT:     Head: Normocephalic and atraumatic.  Eyes:     Pupils: Pupils are equal, round, and reactive to light.  Cardiovascular:     Rate and Rhythm: Normal rate and regular rhythm.  Pulmonary:     Effort: Pulmonary effort is normal. No respiratory distress.  Abdominal:     Tenderness: There is abdominal tenderness in the suprapubic area.  Neurological:     Mental Status: She is alert and oriented to person, place, and time.  Psychiatric:        Mood and Affect: Mood normal.        Behavior: Behavior normal.       Assessment/Plan: 1. Acute cystitis with hematuria (Primary) Culture  sent to lab. Macrobid prescribed, take until gone  - CULTURE, URINE COMPREHENSIVE - nitrofurantoin, macrocrystal-monohydrate, (MACROBID) 100 MG capsule; Take 1 capsule (100 mg total) by mouth 2 (two) times daily for 7 days. Take with food  Dispense: 14 capsule; Refill: 0  2. Dysuria Urinalysis positive for trace blood, culture sent to lab.  - POCT urinalysis dipstick   General Counseling: quantia grullon understanding of the findings of todays visit and agrees with plan of treatment. I have discussed any further diagnostic evaluation that may be needed or ordered today. We also reviewed her medications today. she  has been encouraged to call the office with any questions or concerns that should arise related to todays visit.    Counseling:    Orders Placed This Encounter  Procedures   CULTURE, URINE COMPREHENSIVE   POCT urinalysis dipstick    Meds ordered this encounter  Medications   nitrofurantoin, macrocrystal-monohydrate, (MACROBID) 100 MG capsule    Sig: Take 1 capsule (100 mg total) by mouth 2 (two) times daily for 7 days. Take with food    Dispense:  14 capsule    Refill:  0    Fill new script today.    Return if symptoms worsen or fail to improve.  [place] Controlled Substance Database was reviewed by me for overdose risk score (ORS)  Time spent:20 Minutes Time spent with patient included reviewing progress notes, labs, imaging studies, and discussing plan for follow up.   This patient was seen by Laurence Pons, FNP-C in collaboration with Dr. Verneta Gone as a part of collaborative care agreement.  Jehan Ranganathan R. Bobbi Burow, MSN, FNP-C Internal Medicine

## 2024-03-21 LAB — CULTURE, URINE COMPREHENSIVE

## 2024-03-22 ENCOUNTER — Other Ambulatory Visit: Payer: Self-pay | Admitting: Physician Assistant

## 2024-03-22 DIAGNOSIS — F411 Generalized anxiety disorder: Secondary | ICD-10-CM

## 2024-04-09 ENCOUNTER — Other Ambulatory Visit (HOSPITAL_COMMUNITY)
Admission: RE | Admit: 2024-04-09 | Discharge: 2024-04-09 | Disposition: A | Discharge status: Home / Self Care | Source: Ambulatory Visit | Attending: Licensed Practical Nurse | Admitting: Licensed Practical Nurse

## 2024-04-09 ENCOUNTER — Ambulatory Visit (INDEPENDENT_AMBULATORY_CARE_PROVIDER_SITE_OTHER)

## 2024-04-09 VITALS — BP 90/60 | HR 78 | Ht 60.0 in | Wt 147.0 lb

## 2024-04-09 DIAGNOSIS — R300 Dysuria: Secondary | ICD-10-CM | POA: Diagnosis not present

## 2024-04-09 DIAGNOSIS — N898 Other specified noninflammatory disorders of vagina: Secondary | ICD-10-CM

## 2024-04-09 LAB — POCT URINALYSIS DIPSTICK
Bilirubin, UA: NEGATIVE
Blood, UA: NEGATIVE
Glucose, UA: NEGATIVE
Ketones, UA: NEGATIVE
Leukocytes, UA: NEGATIVE
Nitrite, UA: NEGATIVE
Protein, UA: NEGATIVE
Spec Grav, UA: 1.01 (ref 1.010–1.025)
Urobilinogen, UA: 0.2 U/dL
pH, UA: 6 (ref 5.0–8.0)

## 2024-04-09 MED ORDER — FLUCONAZOLE 150 MG PO TABS: 150.0000 mg | ORAL_TABLET | Freq: Once | ORAL | 1 refills | Status: AC

## 2024-04-09 MED ORDER — FLUCONAZOLE 150 MG PO TABS: 150.0000 mg | ORAL_TABLET | Freq: Once | ORAL | 0 refills | Status: DC

## 2024-04-09 NOTE — Progress Notes (Signed)
    NURSE VISIT NOTE  Subjective:    [name], female    DOB: 01-31-1991, 33 y.o.   MRN: [id number]  HPI  Patient is a 33 y.o. G3P0101 female who presents for vaginal itching for 7 day(s). Denies abnormal vaginal bleeding or significant pelvic pain or fever. admits to dysuria. Patient denies history of known exposure to STD. Pt states she just finished an antibiotic and the itching started.     Objective:    BP 90/60   Pulse 78   Ht 5' (1.524 m)   Wt 147 lb (66.7 kg)   LMP 04/01/2024   BMI 28.71 kg/m    @THIS  VISIT ONLY@  Assessment:   1. Vaginal itching   2. Dysuria       Plan:   GC and chlamydia DNA  probe sent to lab. Treatment: abstain from coitus during course of treatment and diflucan  sent to pharmacy ROV prn if symptoms persist or worsen.   Rian Chafe, CMA

## 2024-04-13 LAB — CERVICOVAGINAL ANCILLARY ONLY
Bacterial Vaginitis (gardnerella): NEGATIVE
Candida Glabrata: NEGATIVE
Candida Vaginitis: NEGATIVE
Chlamydia: NEGATIVE
Comment: NEGATIVE
Comment: NEGATIVE
Comment: NEGATIVE
Comment: NEGATIVE
Comment: NEGATIVE
Comment: NORMAL
Neisseria Gonorrhea: NEGATIVE
Trichomonas: NEGATIVE

## 2024-04-14 DIAGNOSIS — B952 Enterococcus as the cause of diseases classified elsewhere: Secondary | ICD-10-CM

## 2024-04-14 LAB — URINE CULTURE

## 2024-04-14 MED ORDER — NITROFURANTOIN MONOHYD MACRO 100 MG PO CAPS
100.0000 mg | ORAL_CAPSULE | Freq: Two times a day (BID) | ORAL | 0 refills | Status: DC
Start: 2024-04-14 — End: 2024-06-08

## 2024-04-29 ENCOUNTER — Encounter: Payer: Self-pay | Admitting: Urology

## 2024-04-29 ENCOUNTER — Ambulatory Visit: Admitting: Urology

## 2024-04-29 VITALS — BP 114/76 | HR 71 | Ht 60.0 in | Wt 146.0 lb

## 2024-04-29 DIAGNOSIS — R399 Unspecified symptoms and signs involving the genitourinary system: Secondary | ICD-10-CM

## 2024-04-29 DIAGNOSIS — R300 Dysuria: Secondary | ICD-10-CM

## 2024-04-29 LAB — MICROSCOPIC EXAMINATION

## 2024-04-29 LAB — BLADDER SCAN AMB NON-IMAGING

## 2024-04-29 MED ORDER — AMOXICILLIN-POT CLAVULANATE 875-125 MG PO TABS: 1.0000 | ORAL_TABLET | Freq: Two times a day (BID) | ORAL | 0 refills | Status: DC

## 2024-04-29 NOTE — Progress Notes (Unsigned)
 04/29/2024 9:51 AM   Domingo Friend Aug 28, 1991 [id number]  Referring provider: Jacques Mattock, PA-C [address] [place],  [place] [address]  Urological history: 1. Dysuria  2. LU TS  Chief Complaint  Patient presents with   Urinary Tract Infection   HPI: Tina Rodgers is a 33 y.o. woman who presents today for UTI symptoms on and off for one months with no improvement with antibiotics.    Previous records reviewed.   She was seen at [facility] for symptoms of back pain, burning with urination, blood in the urine, urgency, frequency, urinary discomfort and pelvic pressure.  Urine culture was negative.  She then developed vaginal itching and was put on Diflucan  and a urine culture was resubmitted on May 8 through her primary care physician's office and it grew out Enterococcus faecalis.  She continues to have persistent dysuria and urgency along with right sided flank pain.  Patient denies any modifying or aggravating factors.  Patient denies any gross hematuria or flank pain.  Patient denies any fevers, chills, nausea or vomiting.   Today's UA is yellow slightly cloudy, specific gravity 1.010, pH 7.0, 0-5 WBCs, 0-2 RBCs, 0-10 epithelial cells, amorphous sediment present, and few bacteria.  She denies any previous history of kidney stones.  PVR 0 mL   PMH: Past Medical History:  Diagnosis Date   ADD (attention deficit disorder)    Vitamin D deficiency     Surgical History: Past Surgical History:  Procedure Laterality Date   BREAST SURGERY     2 lumps removed- neg   COLONOSCOPY WITH PROPOFOL  N/A 04/25/2022   Procedure: COLONOSCOPY WITH PROPOFOL ;  Surgeon: Selena Daily, MD;  Location: [facility] ENDOSCOPY;  Service: Gastroenterology;  Laterality: N/A;    Home Medications:  Allergies as of 04/29/2024       Reactions   Dust Mite Mixed Allergen Ext [mite (d. Farinae)]    Misc. Sulfonamide Containing Compounds Other (See Comments)    Sulfa Antibiotics Other (See Comments)   flush        Medication List        Accurate as of Apr 29, 2024 11:59 PM. If you have any questions, ask your nurse or doctor.          albuterol  108 (90 Base) MCG/ACT inhaler Commonly known as: VENTOLIN  HFA TAKE 2 PUFFS BY MOUTH EVERY 6 HOURS AS NEEDED FOR WHEEZE OR SHORTNESS OF BREATH   amoxicillin -clavulanate 875-125 MG tablet Commonly known as: AUGMENTIN  Take 1 tablet by mouth every 12 (twelve) hours. Started by: Matilde Son   cholecalciferol 25 MCG (1000 UNIT) tablet Commonly known as: VITAMIN D3 Take 1,000 Units by mouth daily.   fluticasone  50 MCG/ACT nasal spray Commonly known as: FLONASE  SPRAY 2 SPRAYS INTO EACH NOSTRIL EVERY DAY   mirabegron  ER 25 MG Tb24 tablet Commonly known as: MYRBETRIQ  TAKE 1 TABLET (25 MG TOTAL) BY MOUTH DAILY.   nitrofurantoin  (macrocrystal-monohydrate) 100 MG capsule Commonly known as: Macrobid  Take 1 capsule (100 mg total) by mouth 2 (two) times daily.   NON FORMULARY Vaginal probiotic tablet ( one tablet daily)   ONE-A-DAY WOMENS PO Take by mouth.   sertraline  50 MG tablet Commonly known as: ZOLOFT  TAKE 1 TABLET BY MOUTH EVERY DAY   triamcinolone  0.025 % ointment Commonly known as: KENALOG  Apply 1 Application topically 2 (two) times daily.        Allergies:  Allergies  Allergen Reactions   Dust Mite Mixed Allergen Ext [Mite (D.  Farinae)]    Misc. Sulfonamide Containing Compounds Other (See Comments)   Sulfa Antibiotics Other (See Comments)    flush    Family History: Family History  Problem Relation Age of Onset   Ovarian cancer Paternal Grandmother    Atrial fibrillation Father    Breast cancer Neg Hx    Colon cancer Neg Hx    Diabetes Neg Hx     Social History:  reports that she has never smoked. She has never used smokeless tobacco. She reports current alcohol use. She reports that she does not use drugs.  ROS: Pertinent ROS in HPI  Physical Exam: BP  114/76   Pulse 71   Ht 5' (1.524 m)   Wt 146 lb (66.2 kg)   LMP 04/01/2024   BMI 28.51 kg/m   Constitutional:  Well nourished. Alert and oriented, No acute distress. HEENT: [place] AT, moist mucus membranes.  Trachea midline Cardiovascular: No clubbing, cyanosis, or edema. Respiratory: Normal respiratory effort, no increased work of breathing. Neurologic: Grossly intact, no focal deficits, moving all 4 extremities. Psychiatric: Normal mood and affect.    Laboratory Data: Lab Results  Component Value Date   WBC 7.8 07/09/2023   HGB 12.7 07/09/2023   HCT 38.6 07/09/2023   MCV 90 07/09/2023   PLT 325 07/09/2023   Lab Results  Component Value Date   CREATININE 0.79 06/12/2023   Lab Results  Component Value Date   TSH 2.200 06/12/2023      Component Value Date/Time   CHOL 192 06/12/2023 0908   HDL 52 06/12/2023 0908   LDLCALC 104 (H) 06/12/2023 0908   Lab Results  Component Value Date   AST 16 06/12/2023   Lab Results  Component Value Date   ALT 17 06/12/2023   Urinalysis See EPIC and HPI  I have reviewed the labs.   Pertinent Imaging:  04/29/24 15:57  Scan Result 0ml   Assessment & Plan:    1. Lower urinary tract symptoms (Primary) - symptoms of dysuria and frequency  - CULTURE, URINE COMPREHENSIVE - Urinalysis, Complete, unremarkable  - Mycoplasma / ureaplasma culture - BLADDER SCAN AMB NON-IMAGING, demonstrates adequate bladder emptying - I started Augmentin  875/125 twice daily for 7 days empirically - Explained if urine culture results returned positive we will make sure we treat with culture appropriate antibiotics and then reassess - If urine cultures returned negative, explained we will need to investigate her right flank pain and further as this may be caused by a kidney stone  2. Right Flank Pain - Obtain a serum pregnancy test today - If serum pregnancy test is negative and urine cultures returned negative, we will obtain a KUB for further  evaluation for nephrolithiasis - If KUB is negative for stones, but she continues to have the right sided flank pain, we may need to progress to CT scan   Return for Pending culture results .  These notes generated with voice recognition software. I apologize for typographical errors.  Briant Camper  [facility] [address]  [address] [place], [place] [address] [phone]

## 2024-04-30 LAB — MICROSCOPIC EXAMINATION

## 2024-04-30 LAB — URINALYSIS, COMPLETE
Bilirubin, UA: NEGATIVE
Glucose, UA: NEGATIVE
Ketones, UA: NEGATIVE
Leukocytes,UA: NEGATIVE
Nitrite, UA: NEGATIVE
Protein,UA: NEGATIVE
RBC, UA: NEGATIVE
Specific Gravity, UA: 1.01 (ref 1.005–1.030)
Urobilinogen, Ur: 0.2 mg/dL (ref 0.2–1.0)
pH, UA: 7 (ref 5.0–7.5)

## 2024-04-30 LAB — HCG, SERUM, QUALITATIVE: hCG,Beta Subunit,Qual,Serum: NEGATIVE m[IU]/mL (ref <6)

## 2024-05-02 ENCOUNTER — Ambulatory Visit: Payer: Self-pay | Admitting: Urology

## 2024-05-02 LAB — CULTURE, URINE COMPREHENSIVE

## 2024-05-05 LAB — MYCOPLASMA / UREAPLASMA CULTURE
Mycoplasma hominis Culture: NEGATIVE
Ureaplasma urealyticum: NEGATIVE

## 2024-05-11 ENCOUNTER — Other Ambulatory Visit: Payer: Self-pay | Admitting: Urology

## 2024-05-11 DIAGNOSIS — R109 Unspecified abdominal pain: Secondary | ICD-10-CM

## 2024-05-15 ENCOUNTER — Ambulatory Visit
Admission: RE | Admit: 2024-05-15 | Discharge: 2024-05-15 | Disposition: A | Discharge status: Home / Self Care | Source: Ambulatory Visit | Attending: Urology | Admitting: Urology

## 2024-05-15 ENCOUNTER — Telehealth: Payer: Self-pay | Admitting: Urology

## 2024-05-15 DIAGNOSIS — R109 Unspecified abdominal pain: Secondary | ICD-10-CM | POA: Diagnosis present

## 2024-05-15 NOTE — Telephone Encounter (Signed)
 Pt  aware u/s results are not back yet.   Once we have the report back if pt needs to be notified before 7/1 we will call her.   Pt vocied understanding.

## 2024-05-15 NOTE — Telephone Encounter (Signed)
 Pt called office to schedule u/s results appt, she had u/s 6/13 and Cathleen Coach can't see her until 7/1. I put her on wait list also. She asked if there was anything wrong, would she get a call before?

## 2024-05-21 IMAGING — MR MR PELVIS WO/W CM
13 of 16 series · 36 of 48 positions shown · IV contrast (gadavist)
Comparison: Pelvic ultrasound April 07, 2022.

CLINICAL DATA: Pelvic pain.

EXAM:
MRI PELVIS WITHOUT AND WITH CONTRAST
TECHNIQUE: Multiplanar multisequence MR imaging of the pelvis was performed
both before and after administration of intravenous contrast.
CONTRAST:  6mL GADAVIST GADOBUTROL 1 MMOL/ML IV SOLN

[Series 2: T2 · coronal · 5.0mm · 1.41mm/px · 1 of 30 slices shown (1 of 3)]
[im 1/30]
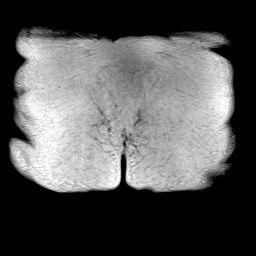

[Series 3: T2 · coronal · 5.0mm · 0.69mm/px · 1 of 28 slices shown (2 of 3)]
[im 1/28]
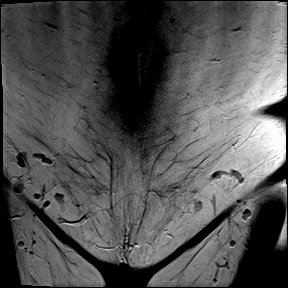

[Series 4: T2 · axial · 5.0mm · 0.75mm/px · z∈[-160,+56]mm · 2 of 37 slices shown (3 of 3)]
[im 1/37]
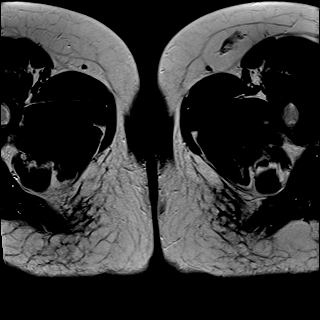
[im 37/37]
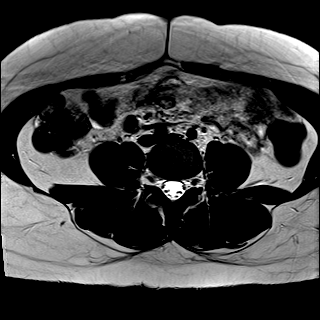

[Series 5: T2 fat-sat · axial · 5.0mm · 0.90mm/px · z∈[-160,+56]mm · 2 of 37 slices shown]
[im 1/37]
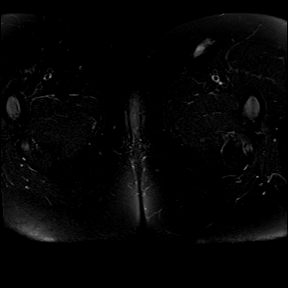
[im 37/37]
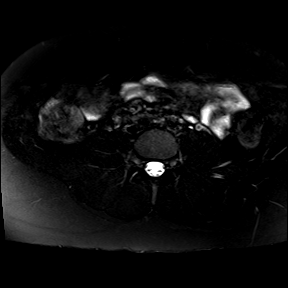

[Series 6: sag tse · sagittal · 5.0mm · 0.75mm/px · 2 of 33 slices shown]
[im 1/33]
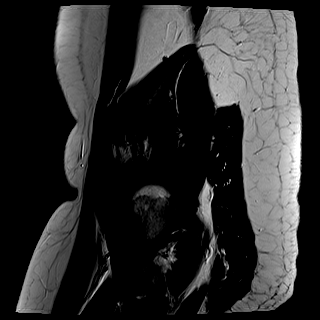
[im 33/33]
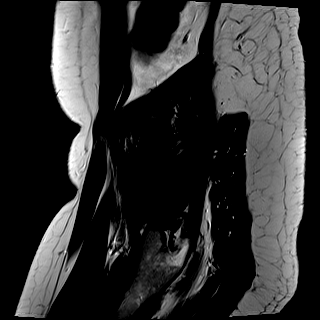

[Series 7: ax dwi_tracew · axial · 6.0mm · 1.14mm/px · z∈[-171,+67]mm · 5 of 102 slices shown]
[im 1/102]
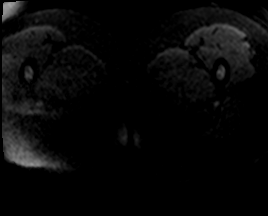
[im 26/102]
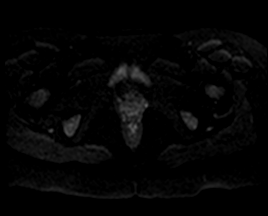
[im 51/102]
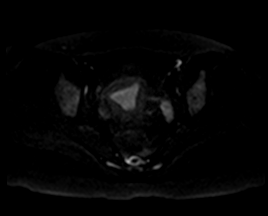
[im 76/102]
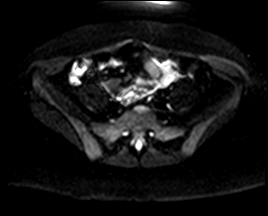
[im 102/102]
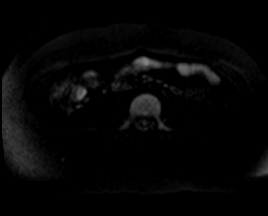

[Series 8: ax dwi_adc · axial · 6.0mm · 1.14mm/px · z∈[-171,+67]mm · 2 of 34 slices shown]
[im 1/34]
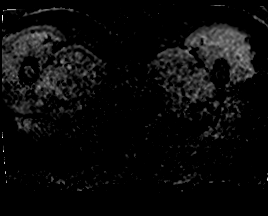
[im 34/34]
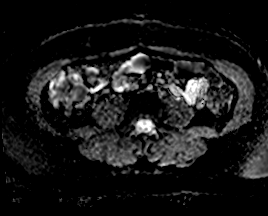

[Series 9: DIXON · axial · 3.0mm · 1.09mm/px · z∈[-163,+50]mm · 4 of 72 slices shown (1 of 2)]
[im 1/72]
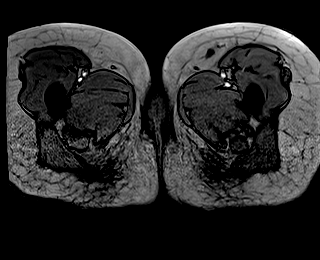
[im 24/72]
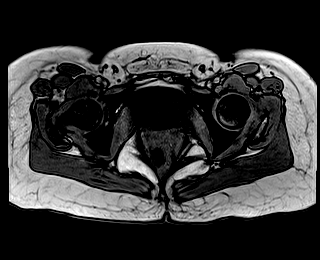
[im 48/72]
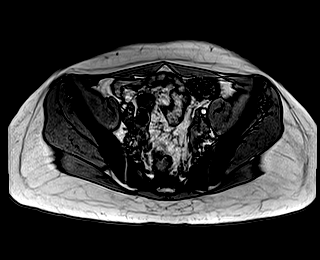
[im 72/72]
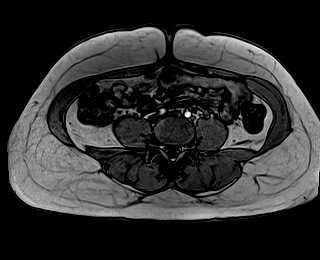

[Series 10: DIXON · axial · 3.0mm · 1.09mm/px · z∈[-163,+50]mm · 4 of 72 slices shown (2 of 2)]
[im 1/72]
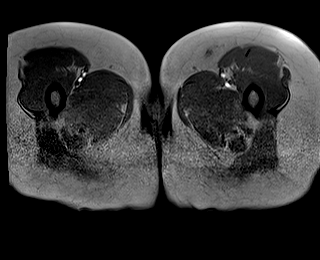
[im 24/72]
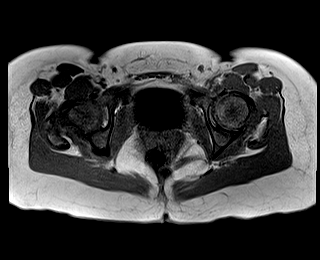
[im 48/72]
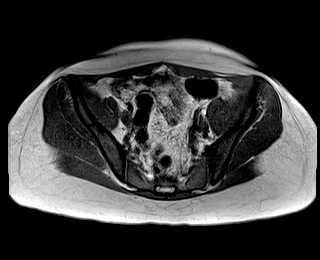
[im 72/72]
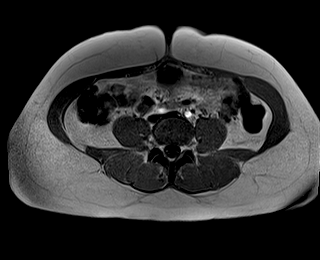

[Series 12: T1 dynamic fat-sat · axial · 3.0mm · 0.47mm/px · z∈[-170,+67]mm · 4 of 80 slices shown]
[im 1/80]
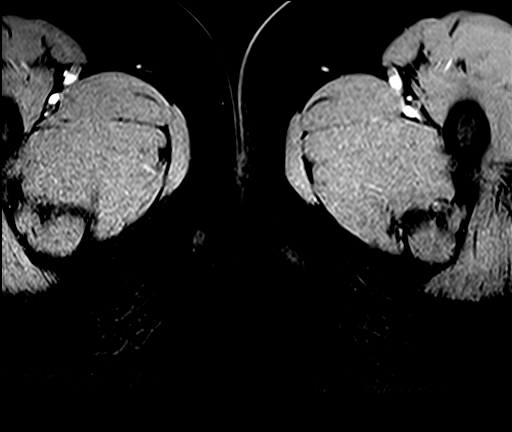
[im 27/80]
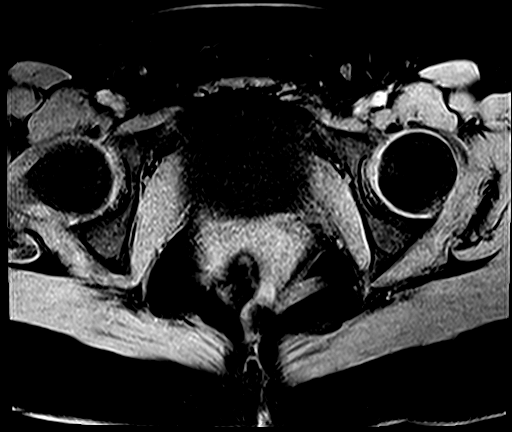
[im 53/80]
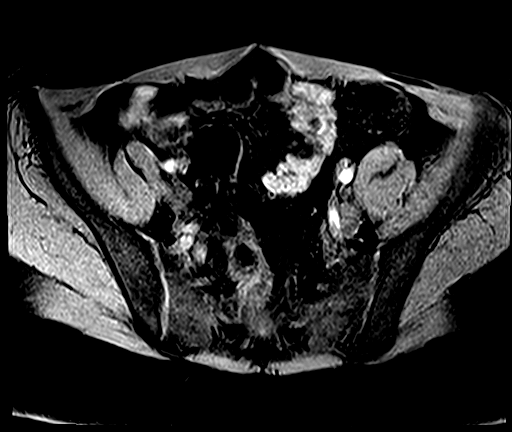
[im 80/80]
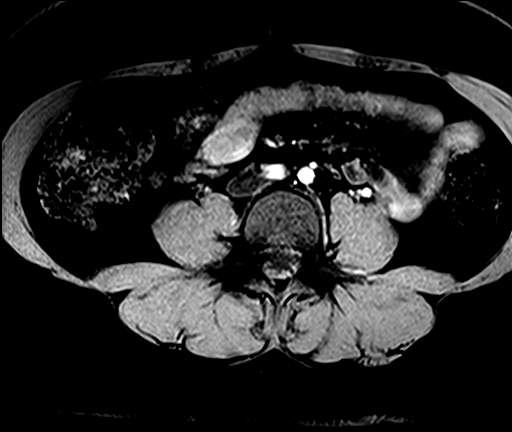

[Series 14: T1 dynamic post-contrast · axial · 3.0mm · 0.47mm/px · z∈[-170,+67]mm · 4 of 80 slices shown (1 of 3)]
[im 1/80]
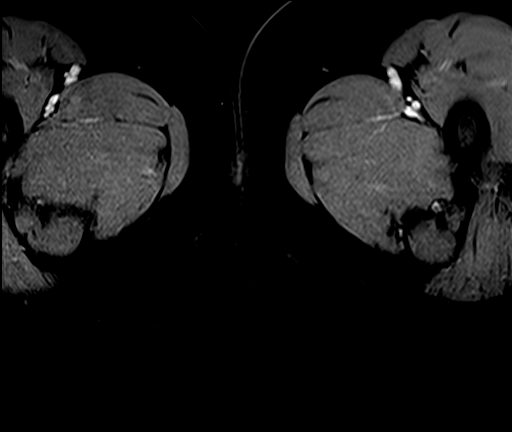
[im 27/80]
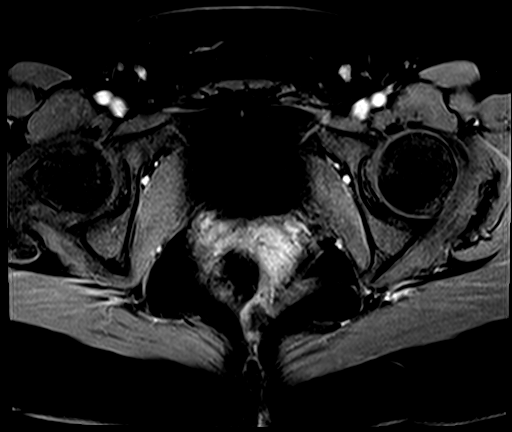
[im 53/80]
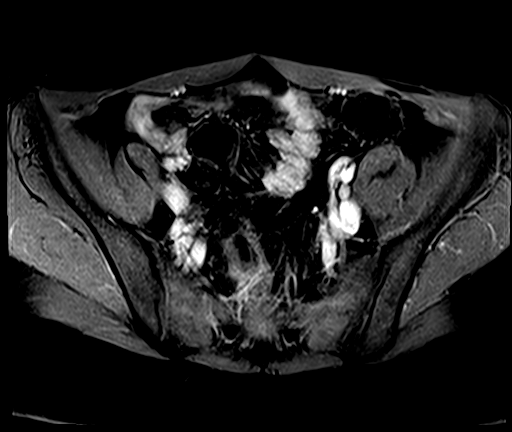
[im 80/80]
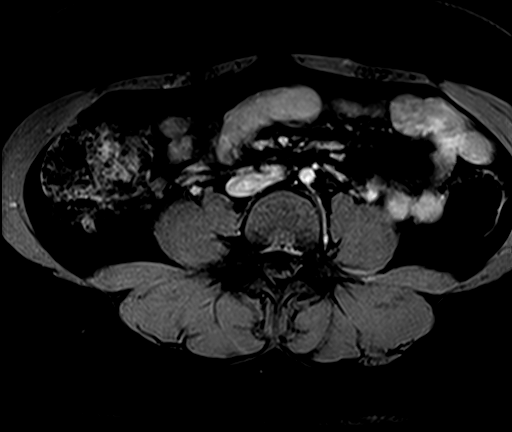

[Series 16: T1 dynamic post-contrast · axial · 3.0mm · 0.47mm/px · z∈[-170,+67]mm · 4 of 80 slices shown (2 of 3)]
[im 1/80]
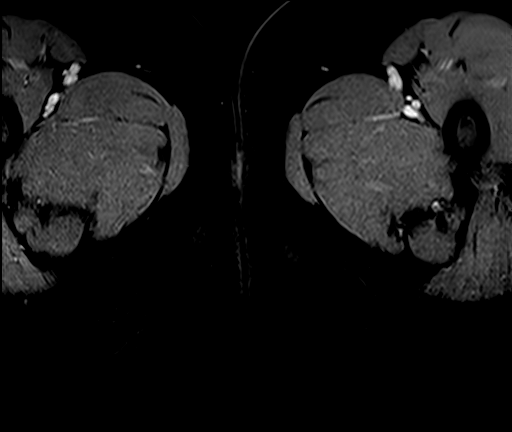
[im 27/80]
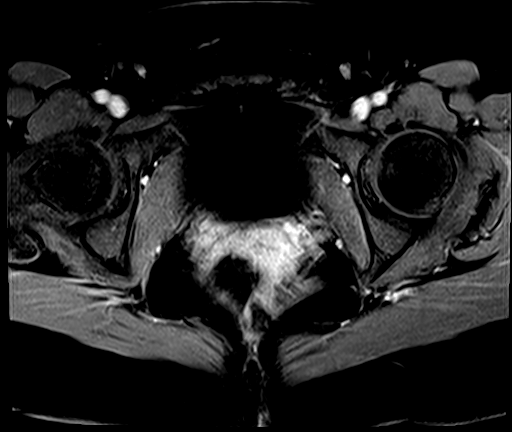
[im 53/80]
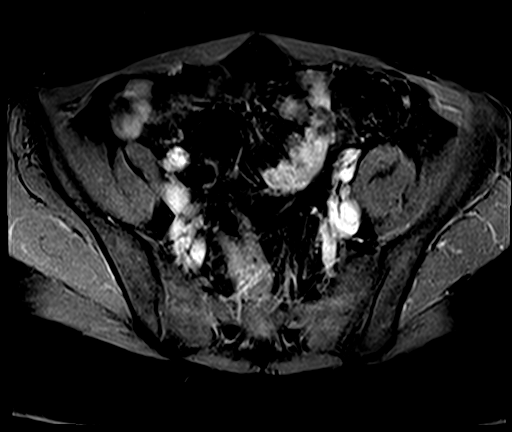
[im 80/80]
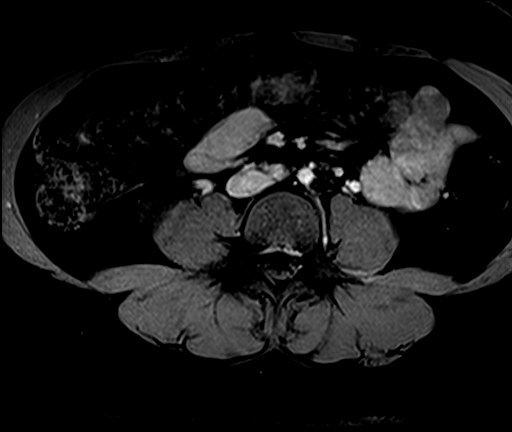

[Series 18: T1 dynamic post-contrast · axial · 3.0mm · 0.47mm/px · 1 of 80 slices shown (3 of 3)]
[im 1/80]
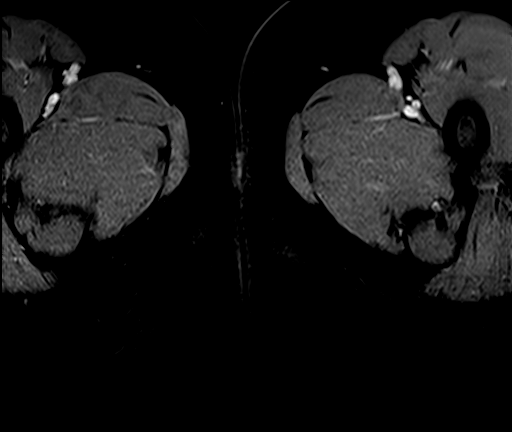

[36 of 48 positions shown; findings below may reference images not displayed]

FINDINGS: Urinary Tract: Unremarkable urinary bladder.

Bowel: Unremarkable pelvic bowel loops.

Vascular/Lymphatic: No pathologically enlarged lymph nodes or other
significant abnormality.

Reproductive:

Uterus: Measures 7.2 x 4.7 x 3.8 cm. No fibroids or other masses
identified. The endometrial stripe measures up to 7 mm. The
junctional zone measures up to 9 mm. There are few cervical
nabothian cysts measuring up to 9 mm.

Right ovary: Appears normal. No mass identified.

Left ovary:  Appears normal. No mass identified.

Other: Trace pelvic free fluid is within physiologic normal limits.

Musculoskeletal:  Unremarkable.
IMPRESSION: No acute abnormality in the abdomen or pelvis.

No uterine fibroids or suspicious uterine masses.

Bilateral ovaries appear normal.

Small simple nabothian cysts in the cervix.

## 2024-05-21 IMAGING — US US ABDOMEN LIMITED
1 series · 14 of 25 positions shown · non-contrast
Comparison: None Available.

CLINICAL DATA: Right upper quadrant abdominal pain

EXAM:
ULTRASOUND ABDOMEN LIMITED RIGHT UPPER QUADRANT

[Series 1: us abdomen limited ruq (liver/gb) · 14 of 61 slices shown]
[im 1/61]
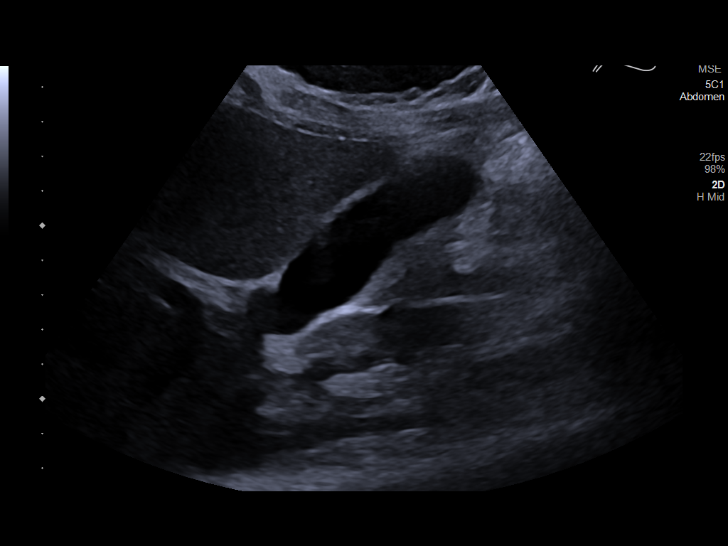
[im 6/61]
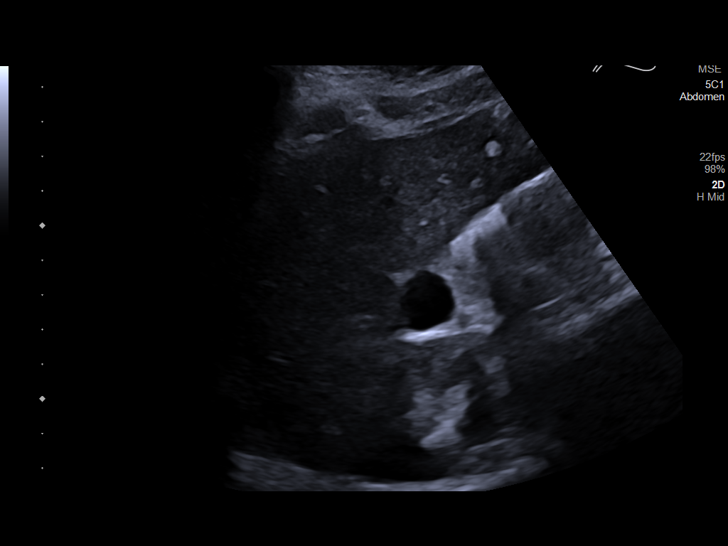
[im 11/61]
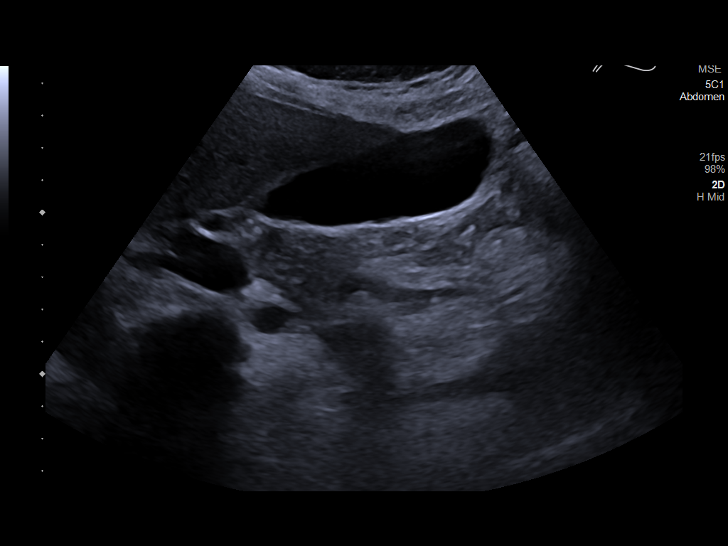
[im 16/61]
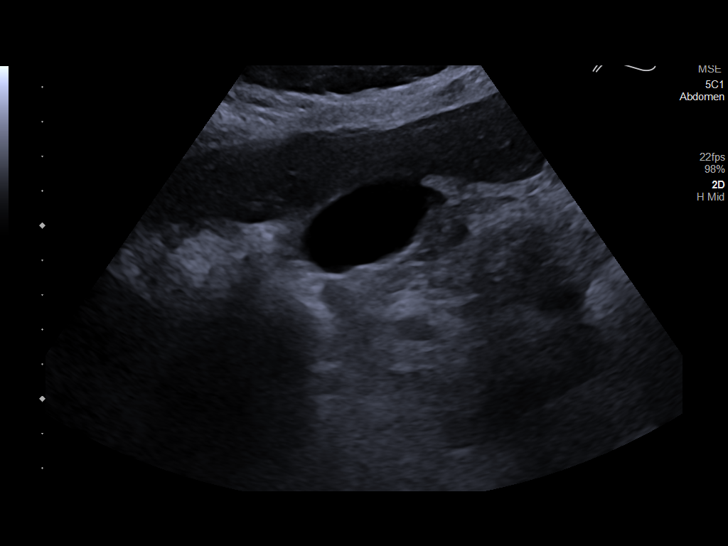
[im 21/61]
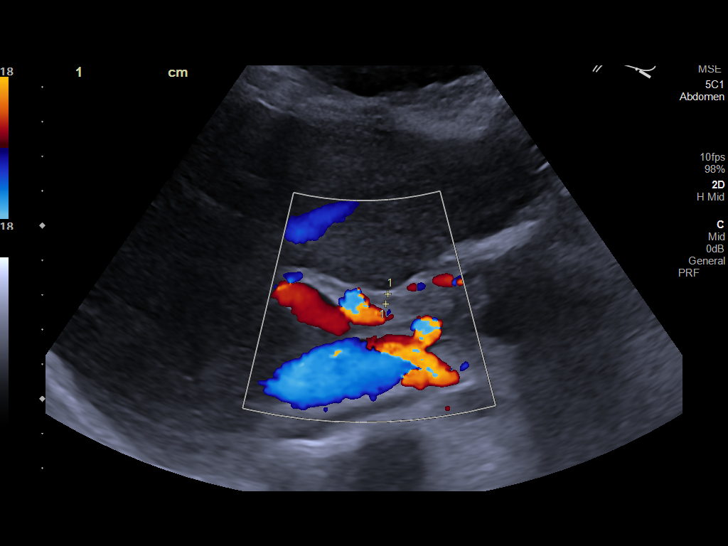
[im 23/61]
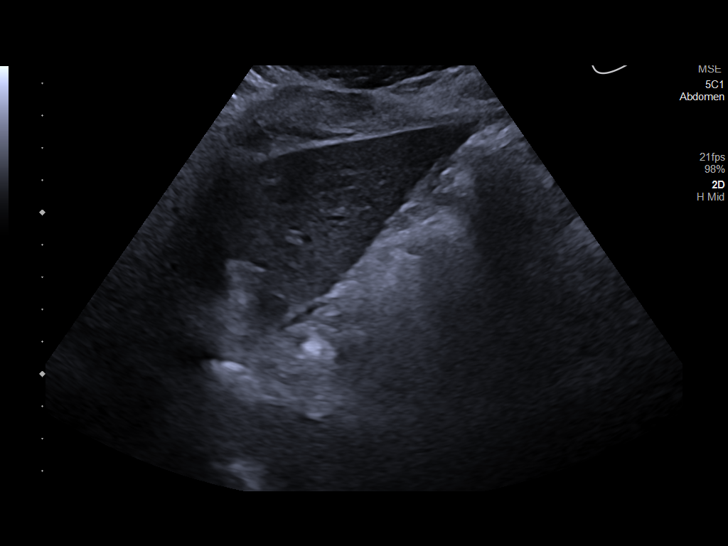
[im 28/61]
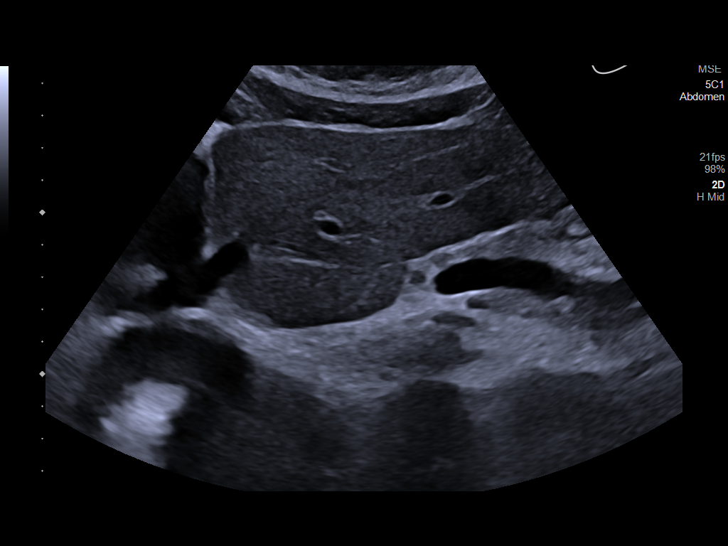
[im 33/61]
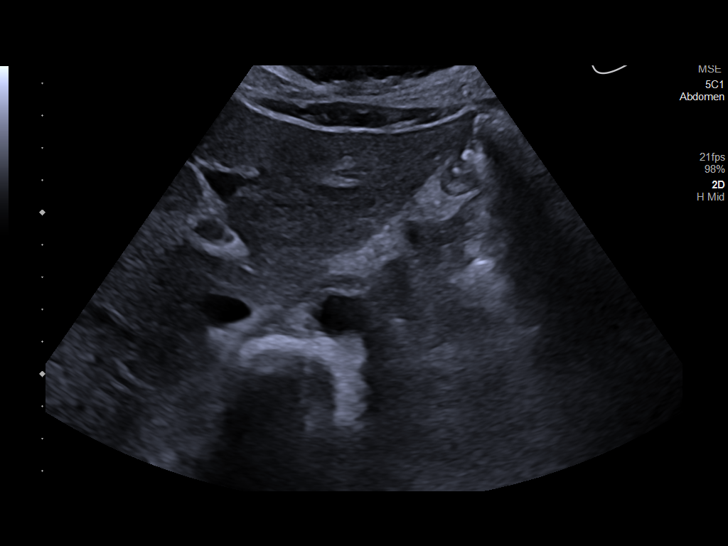
[im 38/61]
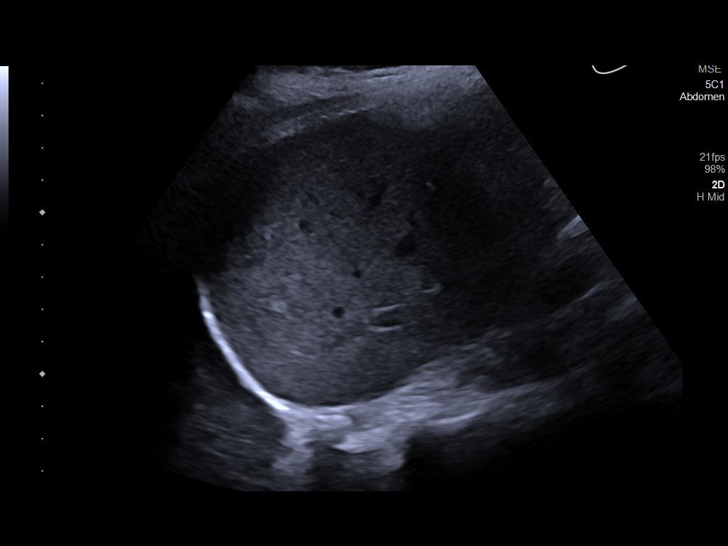
[im 41/61]
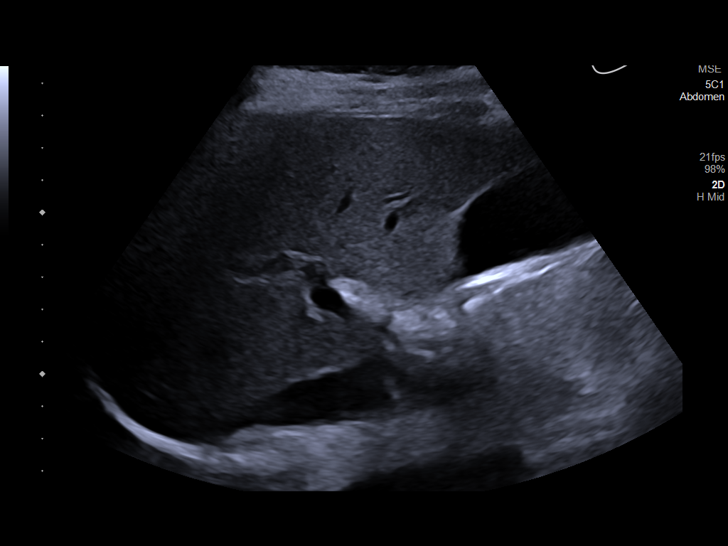
[im 46/61]
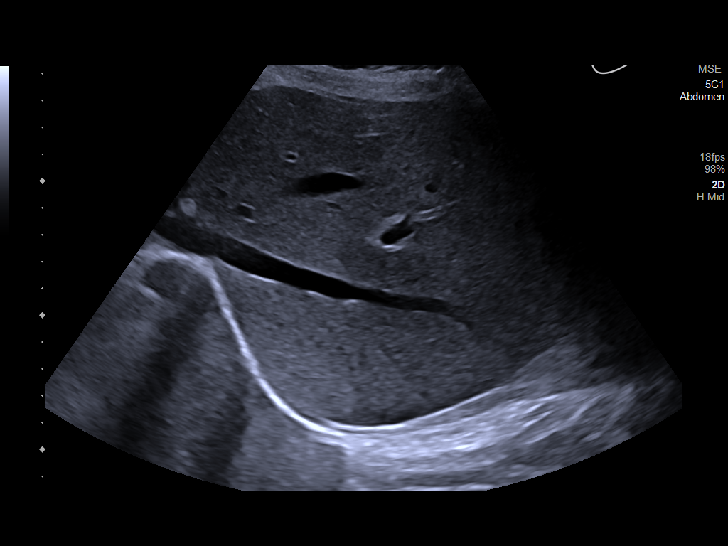
[im 51/61]
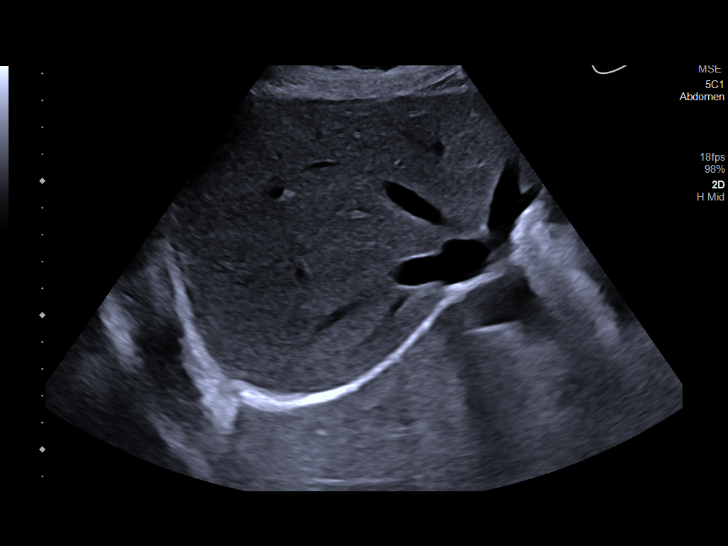
[im 56/61]
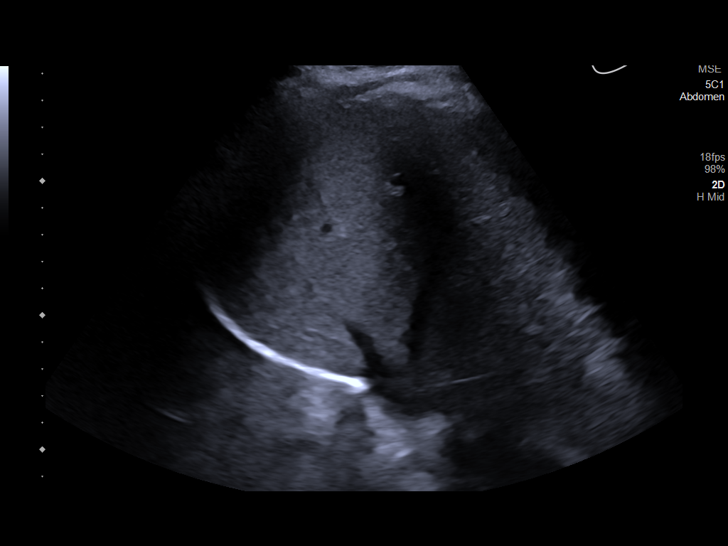
[im 61/61]
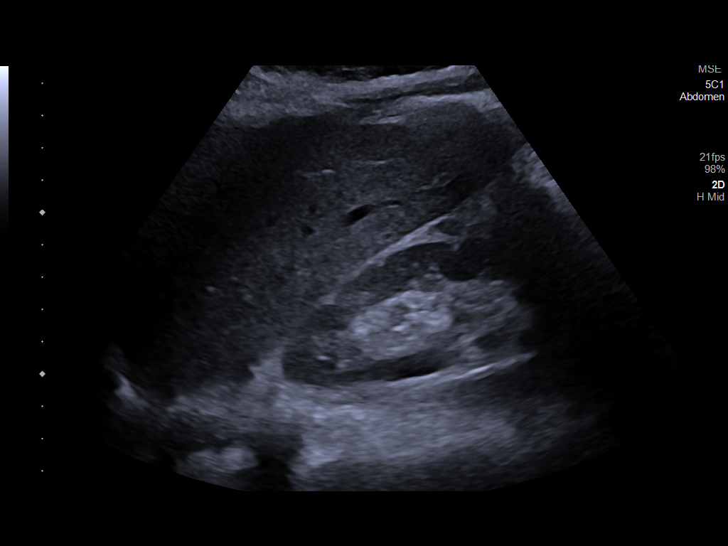

[14 of 25 positions shown; findings below may reference images not displayed]

FINDINGS: Gallbladder:

No gallstones or wall thickening visualized. No sonographic Murphy
sign noted by sonographer.

Common bile duct:

Diameter: 3 mm

Liver:

No focal lesion identified. Within normal limits in parenchymal
echogenicity. Portal vein is patent on color Doppler imaging with
normal direction of blood flow towards the liver.

Other: None.
IMPRESSION: Unremarkable examination.

## 2024-06-02 ENCOUNTER — Ambulatory Visit: Admitting: Urology

## 2024-06-05 NOTE — Progress Notes (Deleted)
 06/08/2024 10:55 PM   Tina Rodgers 07-25-1991 [id number]  Referring provider: Kristina [name], PA-C [address] [place],  [place] [address]  Urological history: 1. Dysuria  2. LU TS  No chief complaint on file.  HPI: Tina Rodgers is a 33 y.o. woman who presents today for renal ultrasound results.    Previous records reviewed.     At her visit on 04/29/2024, she was experiencing UTI symptoms on and off for one months with no improvement with antibiotics.   She was seen at [facility] for symptoms of back pain, burning with urination, blood in the urine, urgency, frequency, urinary discomfort and pelvic pressure.  Urine culture was negative.  She then developed vaginal itching and was put on Diflucan  and a urine culture was submitted on May 8 through her primary care physician's office and it grew out Enterococcus faecalis.  She continues to have persistent dysuria and urgency along with right sided flank pain.  Patient denies any modifying or aggravating factors.  Patient denies any gross hematuria or flank pain.  Patient denies any fevers, chills, nausea or vomiting.   Today's UA is yellow slightly cloudy, specific gravity 1.010, pH 7.0, 0-5 WBCs, 0-2 RBCs, 0-10 epithelial cells, amorphous sediment present, and few bacteria.  Urine culture and atypical culture are negative.  She denies any previous history of kidney stones.   PVR 0 mL   She underwent an ultrasound for further evaluation for her symptoms on May 15, 2024 and it was negative.      PMH: Past Medical History:  Diagnosis Date   ADD (attention deficit disorder)    Vitamin D deficiency     Surgical History: Past Surgical History:  Procedure Laterality Date   BREAST SURGERY     2 lumps removed- neg   COLONOSCOPY WITH PROPOFOL  N/A 04/25/2022   Procedure: COLONOSCOPY WITH PROPOFOL ;  Surgeon: Unk Corinn Skiff, MD;  Location: [facility] ENDOSCOPY;  Service: Gastroenterology;   Laterality: N/A;    Home Medications:  Allergies as of 06/08/2024       Reactions   Dust Mite Mixed Allergen Ext [mite (d. Farinae)]    Misc. Sulfonamide Containing Compounds Other (See Comments)   Sulfa Antibiotics Other (See Comments)   flush        Medication List        Accurate as of June 05, 2024 10:55 PM. If you have any questions, ask your nurse or doctor.          albuterol  108 (90 Base) MCG/ACT inhaler Commonly known as: VENTOLIN  HFA TAKE 2 PUFFS BY MOUTH EVERY 6 HOURS AS NEEDED FOR WHEEZE OR SHORTNESS OF BREATH   amoxicillin -clavulanate 875-125 MG tablet Commonly known as: AUGMENTIN  Take 1 tablet by mouth every 12 (twelve) hours.   cholecalciferol 25 MCG (1000 UNIT) tablet Commonly known as: VITAMIN D3 Take 1,000 Units by mouth daily.   fluticasone  50 MCG/ACT nasal spray Commonly known as: FLONASE  SPRAY 2 SPRAYS INTO EACH NOSTRIL EVERY DAY   mirabegron  ER 25 MG Tb24 tablet Commonly known as: MYRBETRIQ  TAKE 1 TABLET (25 MG TOTAL) BY MOUTH DAILY.   nitrofurantoin  (macrocrystal-monohydrate) 100 MG capsule Commonly known as: Macrobid  Take 1 capsule (100 mg total) by mouth 2 (two) times daily.   NON FORMULARY Vaginal probiotic tablet ( one tablet daily)   ONE-A-DAY WOMENS PO Take by mouth.   sertraline  50 MG tablet Commonly known as: ZOLOFT  TAKE 1 TABLET BY MOUTH EVERY DAY   triamcinolone  0.025 %  ointment Commonly known as: KENALOG  Apply 1 Application topically 2 (two) times daily.        Allergies:  Allergies  Allergen Reactions   Dust Mite Mixed Allergen Ext [Mite (D. Farinae)]    Misc. Sulfonamide Containing Compounds Other (See Comments)   Sulfa Antibiotics Other (See Comments)    flush    Family History: Family History  Problem Relation Age of Onset   Ovarian cancer Paternal Grandmother    Atrial fibrillation Father    Breast cancer Neg Hx    Colon cancer Neg Hx    Diabetes Neg Hx     Social History:  reports that she has  never smoked. She has never used smokeless tobacco. She reports current alcohol use. She reports that she does not use drugs.  ROS: Pertinent ROS in HPI  Physical Exam: There were no vitals taken for this visit.  Constitutional:  Well nourished. Alert and oriented, No acute distress. HEENT: [place] AT, moist mucus membranes.  Trachea midline Cardiovascular: No clubbing, cyanosis, or edema. Respiratory: Normal respiratory effort, no increased work of breathing. Neurologic: Grossly intact, no focal deficits, moving all 4 extremities. Psychiatric: Normal mood and affect.    Laboratory Data: N/A  Pertinent Imaging: CLINICAL DATA:  Initial evaluation for recurrent UTIs. Right flank pain.   EXAM: RENAL / URINARY TRACT ULTRASOUND COMPLETE   COMPARISON:  Prior CT from 04/07/2022   FINDINGS: Right Kidney:   Renal measurements: 11.1 x 3.9 x 5.0 cm = volume: 113.9 mL. Renal echogenicity within normal limits. No nephrolithiasis or hydronephrosis. No focal renal mass. Please note that evaluation of the lower pole the right kidney somewhat limited by shadowing from overlying bowel gas.   Left Kidney:   Renal measurements: 11.4 x 3.9 x 4.8 cm = volume: 110.7 mL. Renal echogenicity within normal limits. No nephrolithiasis or hydronephrosis. No focal renal mass.   Bladder:   Appears normal for degree of bladder distention. Bilateral jets are seen.   Other:   None.   IMPRESSION: Normal renal ultrasound. No nephrolithiasis or hydronephrosis.     Electronically Signed   By: Morene Hoard M.D.   On: 05/16/2024 07:16 I have independently reviewed the films.  See HPI.    Assessment & Plan:    1. Lower urinary tract symptoms (Primary) -   2. Right Flank Pain - RUS w/o hydro  No follow-ups on file.  These notes generated with voice recognition software. I apologize for typographical errors.  CLOTILDA HELON RIGGERS  [facility] [address]  [address] [place], [place] [address] [phone]

## 2024-06-08 ENCOUNTER — Ambulatory Visit (INDEPENDENT_AMBULATORY_CARE_PROVIDER_SITE_OTHER): Payer: Self-pay | Admitting: Physician Assistant

## 2024-06-08 ENCOUNTER — Encounter: Payer: Self-pay | Admitting: Urology

## 2024-06-08 ENCOUNTER — Ambulatory Visit: Admitting: Urology

## 2024-06-08 ENCOUNTER — Encounter: Payer: Self-pay | Admitting: Physician Assistant

## 2024-06-08 VITALS — BP 108/73 | HR 78 | Ht 60.0 in | Wt 147.0 lb

## 2024-06-08 VITALS — BP 122/74 | HR 80 | Temp 98.3°F | Resp 16 | Ht 60.0 in | Wt 147.2 lb

## 2024-06-08 DIAGNOSIS — E782 Mixed hyperlipidemia: Secondary | ICD-10-CM

## 2024-06-08 DIAGNOSIS — R109 Unspecified abdominal pain: Secondary | ICD-10-CM | POA: Diagnosis not present

## 2024-06-08 DIAGNOSIS — R300 Dysuria: Secondary | ICD-10-CM

## 2024-06-08 DIAGNOSIS — R399 Unspecified symptoms and signs involving the genitourinary system: Secondary | ICD-10-CM

## 2024-06-08 DIAGNOSIS — M6289 Other specified disorders of muscle: Secondary | ICD-10-CM

## 2024-06-08 DIAGNOSIS — Z0001 Encounter for general adult medical examination with abnormal findings: Secondary | ICD-10-CM

## 2024-06-08 NOTE — Progress Notes (Signed)
 [facility] [address] [place], [place] [address]  Internal MEDICINE  Office Visit Note  Patient Name: Tina Rodgers  [id number]  [id number]  Date of Service: 06/08/2024  Chief Complaint  Patient presents with   Annual Exam    Back pain on right side     HPI Pt is here for routine health maintenance examination -BP stable -does have right flank pain, and is having UTI symptoms. States difficulty urinating -seeing urology today to follow up on US . She is going to address concerns with them. She did take Azo today and is on her cycle as well. -may need to see ortho if back pain continues outside of urologic concerns, but not tender on exam today, going to follow up with urology to review US  first  -due for labs-slip given -UTD on pap, sees GYN -down 5lbs since last year, has been trying to work on this and eating healthier  Current Medication: Outpatient Encounter Medications as of 06/08/2024  Medication Sig   albuterol  (VENTOLIN  HFA) 108 (90 Base) MCG/ACT inhaler TAKE 2 PUFFS BY MOUTH EVERY 6 HOURS AS NEEDED FOR WHEEZE OR SHORTNESS OF BREATH   amoxicillin -clavulanate (AUGMENTIN ) 875-125 MG tablet Take 1 tablet by mouth every 12 (twelve) hours.   cholecalciferol (VITAMIN D3) 25 MCG (1000 UNIT) tablet Take 1,000 Units by mouth daily.   fluticasone  (FLONASE ) 50 MCG/ACT nasal spray SPRAY 2 SPRAYS INTO EACH NOSTRIL EVERY DAY   mirabegron  ER (MYRBETRIQ ) 25 MG TB24 tablet TAKE 1 TABLET (25 MG TOTAL) BY MOUTH DAILY.   Multiple Vitamins-Minerals (ONE-A-DAY WOMENS PO) Take by mouth.   nitrofurantoin , macrocrystal-monohydrate, (MACROBID ) 100 MG capsule Take 1 capsule (100 mg total) by mouth 2 (two) times daily.   NON FORMULARY Vaginal probiotic tablet ( one tablet daily)   sertraline  (ZOLOFT ) 50 MG tablet TAKE 1 TABLET BY MOUTH EVERY DAY   triamcinolone  (KENALOG ) 0.025 % ointment Apply 1 Application topically 2 (two) times daily.   No facility-administered  encounter medications on file as of 06/08/2024.    Surgical History: Past Surgical History:  Procedure Laterality Date   BREAST SURGERY     2 lumps removed- neg   COLONOSCOPY WITH PROPOFOL  N/A 04/25/2022   Procedure: COLONOSCOPY WITH PROPOFOL ;  Surgeon: Unk Corinn Skiff, MD;  Location: [facility] END[facility];  Service: Gastroenterology;  Laterality: N/A;    Medical History: Past Medical History:  Diagnosis Date   ADD (attention deficit disorder)    Vitamin D deficiency     Family History: Family History  Problem Relation Age of Onset   Ovarian cancer Paternal Grandmother    Atrial fibrillation Father    Breast cancer Neg Hx    Colon cancer Neg Hx    Diabetes Neg Hx       Review of Systems  Constitutional:  Negative for fatigue and fever.  HENT:  Negative for congestion, mouth sores and postnasal drip.   Respiratory:  Negative for cough.   Cardiovascular:  Negative for chest pain.  Gastrointestinal:  Negative for abdominal pain, diarrhea, nausea and vomiting.  Genitourinary:  Positive for difficulty urinating, dysuria, flank pain, frequency and urgency.  Musculoskeletal:  Positive for back pain.  Skin:  Negative for rash.  Neurological:  Negative for weakness and headaches.  Psychiatric/Behavioral: Negative.  Negative for confusion and suicidal ideas.      Vital Signs: BP 122/74   Pulse 80   Temp 98.3 F (36.8 C)   Resp 16   Ht 5' (1.524 m)   Wt  147 lb 3.2 oz (66.8 kg)   SpO2 97%   BMI 28.75 kg/m    Physical Exam Vitals reviewed.  Constitutional:      General: She is not in acute distress.    Appearance: Normal appearance. She is not ill-appearing.  HENT:     Head: Normocephalic and atraumatic.  Eyes:     Pupils: Pupils are equal, round, and reactive to light.  Cardiovascular:     Rate and Rhythm: Normal rate and regular rhythm.  Pulmonary:     Effort: Pulmonary effort is normal. No respiratory distress.  Abdominal:     Tenderness: There is no guarding  or rebound.  Musculoskeletal:        General: No tenderness. Normal range of motion.  Skin:    General: Skin is warm and dry.  Neurological:     Mental Status: She is alert and oriented to person, place, and time.  Psychiatric:        Mood and Affect: Mood normal.        Behavior: Behavior normal.      LABS: Recent Results (from the past 2160 hours)  POCT urinalysis dipstick     Status: None   Collection Time: 03/18/24  8:41 AM  Result Value Ref Range   Color, UA     Clarity, UA     Glucose, UA Negative Negative   Bilirubin, UA negative    Ketones, UA negative    Spec Grav, UA 1.010 1.010 - 1.025   Blood, UA trace    pH, UA 6.0 5.0 - 8.0   Protein, UA Negative Negative   Urobilinogen, UA 0.2 0.2 or 1.0 E.U./dL   Nitrite, UA negative    Leukocytes, UA Negative Negative   Appearance     Odor    CULTURE, URINE COMPREHENSIVE     Status: None   Collection Time: 03/18/24  4:05 PM   Specimen: Urine   Urine  Result Value Ref Range   Urine Culture, Comprehensive Final report    Organism ID, Bacteria Comment     Comment: Mixed urogenital flora 5,000  Colonies/mL   Cervicovaginal ancillary only     Status: None   Collection Time: 04/09/24  1:36 PM  Result Value Ref Range   Neisseria Gonorrhea Negative    Chlamydia Negative    Trichomonas Negative    Bacterial Vaginitis (gardnerella) Negative    Candida Vaginitis Negative    Candida Glabrata Negative    Comment      Normal Reference Range Bacterial Vaginosis - Negative   Comment Normal Reference Range Candida Species - Negative    Comment Normal Reference Range Candida Galbrata - Negative    Comment Normal Reference Range Trichomonas - Negative    Comment Normal Reference Ranger Chlamydia - Negative    Comment      Normal Reference Range Neisseria Gonorrhea - Negative  POCT urinalysis dipstick     Status: Normal   Collection Time: 04/09/24  1:41 PM  Result Value Ref Range   Color, UA     Clarity, UA     Glucose,  UA Negative Negative   Bilirubin, UA Negative    Ketones, UA Negative    Spec Grav, UA 1.010 1.010 - 1.025   Blood, UA Negative    pH, UA 6.0 5.0 - 8.0   Protein, UA Negative Negative   Urobilinogen, UA 0.2 0.2 or 1.0 E.U./dL   Nitrite, UA Negative    Leukocytes, UA Negative Negative  Appearance     Odor    Urine Culture     Status: Abnormal   Collection Time: 04/09/24  1:56 PM   Specimen: Urine   UR  Result Value Ref Range   Urine Culture, Routine Final report (A)    Organism ID, Bacteria Enterococcus faecalis (A)     Comment: Enterococci susceptible to penicillin are predictably susceptible to ampicillin , amoxicillin , ampicillin -sulbactam, amoxicillin -clavulanate, and piperacillin-tazobactam for non-beta-lactamase producing enterococci. (CLSI 2018) For Enterococcus species, aminoglycosides (except for high-level resistance screening), cephalosporins, clindamycin , and trimethoprim-sulfamethoxazole are not effective clinically. (CLSI, M100-S26, 2016) 10,000-25,000 colony forming units per mL    Antimicrobial Susceptibility Comment     Comment:       ** S = Susceptible; I = Intermediate; R = Resistant **                    P = Positive; N = Negative             MICS are expressed in micrograms per mL    Antibiotic                 RSLT#1    RSLT#2    RSLT#3    RSLT#4 Ciprofloxacin                   S Levofloxacin                    S Nitrofurantoin                  S Penicillin                     S Tetracycline                   R Vancomycin                     S   CULTURE, URINE COMPREHENSIVE     Status: None   Collection Time: 04/29/24  3:45 PM   Specimen: Urine   UR  Result Value Ref Range   Urine Culture, Comprehensive Final report    Organism ID, Bacteria Comment     Comment: Mixed urogenital flora 10,000-25,000 colony forming units per mL   Urinalysis, Complete     Status: None   Collection Time: 04/29/24  3:45 PM  Result Value Ref Range   Specific Gravity, UA  1.010 1.005 - 1.030   pH, UA 7.0 5.0 - 7.5   Color, UA Yellow Yellow   Appearance Ur Slightly cloudy Clear   Leukocytes,UA Negative Negative   Protein,UA Negative Negative/Trace   Glucose, UA Negative Negative   Ketones, UA Negative Negative   RBC, UA Negative Negative   Bilirubin, UA Negative Negative   Urobilinogen, Ur 0.2 0.2 - 1.0 mg/dL   Nitrite, UA Negative Negative   Microscopic Examination Comment     Comment: Microscopic follows if indicated.   Microscopic Examination See below:     Comment: Microscopic was indicated and was performed.  Mycoplasma / ureaplasma culture     Status: None   Collection Time: 04/29/24  3:45 PM   Specimen: Genital   UR  Result Value Ref Range   Ureaplasma urealyticum Negative Negative   Mycoplasma hominis Culture Negative Negative  Microscopic Examination     Status: Abnormal   Collection Time: 04/29/24  3:45 PM   Urine  Result Value Ref Range   WBC, UA 0-5 0 -  5 /hpf   RBC, Urine 0-2 0 - 2 /hpf   Epithelial Cells (non renal) 0-10 0 - 10 /hpf   Crystals Present (A) N/A   Crystal Type Amorphous Sediment N/A   Bacteria, UA Few None seen/Few  BLADDER SCAN AMB NON-IMAGING     Status: None   Collection Time: 04/29/24  3:57 PM  Result Value Ref Range   Scan Result 0ml   hCG, serum, qualitative     Status: None   Collection Time: 04/29/24  4:19 PM  Result Value Ref Range   hCG,Beta Subunit,Qual,Serum Negative Negative <6 mIU/mL        Assessment/Plan: 1. Encounter for general adult medical examination with abnormal findings (Primary) CPE performed, lab slip given  2. Mixed hyperlipidemia Will recheck labs, pt reports working on healthier diet for most of past year  3. Right flank pain Non tender on exam, this has been ongoing area of concern and is addressing with urology, has appt today to review US   4. Dysuria Will address at urology appt today   General Counseling: tonia avino understanding of the findings of todays  visit and agrees with plan of treatment. I have discussed any further diagnostic evaluation that may be needed or ordered today. We also reviewed her medications today. she has been encouraged to call the office with any questions or concerns that should arise related to todays visit.    Counseling:    No orders of the defined types were placed in this encounter.   No orders of the defined types were placed in this encounter.   This patient was seen by Tinnie Pro, PA-C in collaboration with Dr. Sigrid Bathe as a part of collaborative care agreement.  Total time spent:35 Minutes  Time spent includes review of chart, medications, test results, and follow up plan with the patient.     Sigrid CHRISTELLA Bathe, MD  Internal Medicine

## 2024-06-08 NOTE — Progress Notes (Signed)
 06/08/2024 4:52 PM   Tina Rodgers 04-02-1991 [id number]  Referring provider: Kristina [name], PA-C [address] [place],  [place] [address]  Urological history: 1. Dysuria  2. LU TS  Chief Complaint  Patient presents with   Dysuria   Follow-up   HPI: Tina Rodgers is a 33 y.o. woman who presents today for renal ultrasound results.    Previous records reviewed.     At her visit on 04/29/2024, she was experiencing UTI symptoms on and off for one months with no improvement with antibiotics.   She was seen at [facility] for symptoms of back pain, burning with urination, blood in the urine, urgency, frequency, urinary discomfort and pelvic pressure.  Urine culture was negative.  She then developed vaginal itching and was put on Diflucan  and a urine culture was submitted on May 8 through her primary care physician's office and it grew out Enterococcus faecalis.  She continues to have persistent dysuria and urgency along with right sided flank pain.  Patient denies any modifying or aggravating factors.  Patient denies any gross hematuria or flank pain.  Patient denies any fevers, chills, nausea or vomiting.   Today's UA is yellow slightly cloudy, specific gravity 1.010, pH 7.0, 0-5 WBCs, 0-2 RBCs, 0-10 epithelial cells, amorphous sediment present, and few bacteria.  Urine culture and atypical culture are negative.  She denies any previous history of kidney stones.   PVR 0 mL   She underwent an ultrasound for further evaluation for her symptoms on May 15, 2024 and it was negative.    She continues to have right sided flank pain, but now it is up higher.  She also has been having dysuria, difficulty emptying her bladder and having to strain to void.  She feels she may have a urinary tract infection.  She has been taking AZO.  She is also on her menstrual cycle.  She is worried that something may be wrong with her kidneys, so she would like renal function  tests ordered today.  Patient denies any modifying or aggravating factors.  Patient denies any fevers, chills, nausea or vomiting.     PMH: Past Medical History:  Diagnosis Date   ADD (attention deficit disorder)    Vitamin D deficiency     Surgical History: Past Surgical History:  Procedure Laterality Date   BREAST SURGERY     2 lumps removed- neg   COLONOSCOPY WITH PROPOFOL  N/A 04/25/2022   Procedure: COLONOSCOPY WITH PROPOFOL ;  Surgeon: Unk Corinn Skiff, MD;  Location: [facility] ENDOSCOPY;  Service: Gastroenterology;  Laterality: N/A;    Home Medications:  Allergies as of 06/08/2024       Reactions   Dust Mite Mixed Allergen Ext [mite (d. Farinae)]    Misc. Sulfonamide Containing Compounds Other (See Comments)   Sulfa Antibiotics Other (See Comments)   flush        Medication List        Accurate as of June 08, 2024  4:52 PM. If you have any questions, ask your nurse or doctor.          STOP taking these medications    amoxicillin -clavulanate 875-125 MG tablet Commonly known as: AUGMENTIN  Stopped by: Tina Rodgers   nitrofurantoin  (macrocrystal-monohydrate) 100 MG capsule Commonly known as: Macrobid  Stopped by: Tina Rodgers       TAKE these medications    albuterol  108 (90 Base) MCG/ACT inhaler Commonly known as: VENTOLIN  HFA TAKE 2 PUFFS BY MOUTH EVERY 6  HOURS AS NEEDED FOR WHEEZE OR SHORTNESS OF BREATH   cholecalciferol 25 MCG (1000 UNIT) tablet Commonly known as: VITAMIN D3 Take 1,000 Units by mouth daily.   fluticasone  50 MCG/ACT nasal spray Commonly known as: FLONASE  SPRAY 2 SPRAYS INTO EACH NOSTRIL EVERY DAY   mirabegron  ER 25 MG Tb24 tablet Commonly known as: MYRBETRIQ  TAKE 1 TABLET (25 MG TOTAL) BY MOUTH DAILY.   NON FORMULARY Vaginal probiotic tablet ( one tablet daily)   ONE-A-DAY WOMENS PO Take by mouth.   sertraline  50 MG tablet Commonly known as: ZOLOFT  TAKE 1 TABLET BY MOUTH EVERY DAY   triamcinolone  0.025 %  ointment Commonly known as: KENALOG  Apply 1 Application topically 2 (two) times daily.        Allergies:  Allergies  Allergen Reactions   Dust Mite Mixed Allergen Ext [Mite (D. Farinae)]    Misc. Sulfonamide Containing Compounds Other (See Comments)   Sulfa Antibiotics Other (See Comments)    flush    Family History: Family History  Problem Relation Age of Onset   Ovarian cancer Paternal Grandmother    Atrial fibrillation Father    Breast cancer Neg Hx    Colon cancer Neg Hx    Diabetes Neg Hx     Social History:  reports that she has never smoked. She has never used smokeless tobacco. She reports current alcohol use. She reports that she does not use drugs.  ROS: Pertinent ROS in HPI  Physical Exam: BP 108/73 (BP Location: Left Arm, Patient Position: Sitting, Cuff Size: Normal)   Pulse 78   Ht 5' (1.524 m)   Wt 147 lb (66.7 kg)   SpO2 97%   BMI 28.71 kg/m   Constitutional:  Well nourished. Alert and oriented, No acute distress. HEENT:  AT, moist mucus membranes.  Trachea midline Cardiovascular: No clubbing, cyanosis, or edema. Respiratory: Normal respiratory effort, no increased work of breathing. Neurologic: Grossly intact, no focal deficits, moving all 4 extremities. Psychiatric: Normal mood and affect.    Laboratory Data: Pending I have reviewed the labs.  See HPI.     Pertinent Imaging: CLINICAL DATA:  Initial evaluation for recurrent UTIs. Right flank pain.   EXAM: RENAL / URINARY TRACT ULTRASOUND COMPLETE   COMPARISON:  Prior CT from 04/07/2022   FINDINGS: Right Kidney:   Renal measurements: 11.1 x 3.9 x 5.0 cm = volume: 113.9 mL. Renal echogenicity within normal limits. No nephrolithiasis or hydronephrosis. No focal renal mass. Please note that evaluation of the lower pole the right kidney somewhat limited by shadowing from overlying bowel gas.   Left Kidney:   Renal measurements: 11.4 x 3.9 x 4.8 cm = volume: 110.7 mL.  Renal echogenicity within normal limits. No nephrolithiasis or hydronephrosis. No focal renal mass.   Bladder:   Appears normal for degree of bladder distention. Bilateral jets are seen.   Other:   None.   IMPRESSION: Normal renal ultrasound. No nephrolithiasis or hydronephrosis.     Electronically Signed   By: Morene Hoard M.D.   On: 05/16/2024 07:16 I have independently reviewed the films.  See HPI.    Assessment & Plan:    1. Lower urinary tract symptoms (Primary) - she would her UA checked and her urine sent for culture as she is worried she may have an infection  - I feel she will also benefit from pelvic floor physical therapy and I have sent a referral and she is in agreement  2. Right Flank Pain - RUS w/o  hydro - she is concerned that her kidney function may be compromised and wants her renal function checked  Return for schedule UA, UCX and BMP . She had to leave to pick up her daughter   These notes generated with voice recognition software. I apologize for typographical errors.  Tina Rodgers  [facility] [address]  [address] [place], [place] [address] [phone]

## 2024-06-10 ENCOUNTER — Other Ambulatory Visit

## 2024-06-12 ENCOUNTER — Other Ambulatory Visit: Payer: Self-pay | Admitting: Physician Assistant

## 2024-06-13 LAB — SPECIMEN STATUS REPORT

## 2024-06-17 ENCOUNTER — Ambulatory Visit: Payer: Self-pay | Admitting: Physician Assistant

## 2024-06-17 LAB — CBC WITH DIFFERENTIAL/PLATELET
Basophils Absolute: 0 x10E3/uL (ref 0.0–0.2)
Basos: 0 %
EOS (ABSOLUTE): 0.2 x10E3/uL (ref 0.0–0.4)
Eos: 3 %
Hematocrit: 40.6 % (ref 34.0–46.6)
Hemoglobin: 13.7 g/dL (ref 11.1–15.9)
Immature Grans (Abs): 0 x10E3/uL (ref 0.0–0.1)
Immature Granulocytes: 0 %
Lymphocytes Absolute: 2.6 x10E3/uL (ref 0.7–3.1)
Lymphs: 43 %
MCH: 31.3 pg (ref 26.6–33.0)
MCHC: 33.7 g/dL (ref 31.5–35.7)
MCV: 93 fL (ref 79–97)
Monocytes Absolute: 0.4 x10E3/uL (ref 0.1–0.9)
Monocytes: 6 %
Neutrophils Absolute: 2.9 x10E3/uL (ref 1.4–7.0)
Neutrophils: 48 %
Platelets: 289 x10E3/uL (ref 150–450)
RBC: 4.38 x10E6/uL (ref 3.77–5.28)
RDW: 12.3 % (ref 11.7–15.4)
WBC: 6.1 x10E3/uL (ref 3.4–10.8)

## 2024-06-17 LAB — TSH: TSH: 2.71 u[IU]/mL (ref 0.450–4.500)

## 2024-06-17 LAB — LIPID PANEL WITH LDL/HDL RATIO
Cholesterol, Total: 180 mg/dL (ref 100–199)
HDL: 44 mg/dL (ref >39)
LDL Chol Calc (NIH): 113 mg/dL — ABNORMAL HIGH (ref 0–99)
LDL/HDL Ratio: 2.6 ratio (ref 0.0–3.2)
Triglycerides: 128 mg/dL (ref 0–149)
VLDL Cholesterol Cal: 23 mg/dL (ref 5–40)

## 2024-06-17 LAB — COMPREHENSIVE METABOLIC PANEL WITH GFR
ALT: 24 IU/L (ref 0–32)
AST: 24 IU/L (ref 0–40)
Albumin: 4.5 g/dL (ref 3.9–4.9)
Alkaline Phosphatase: 84 IU/L (ref 44–121)
BUN/Creatinine Ratio: 13 (ref 9–23)
BUN: 9 mg/dL (ref 6–20)
Bilirubin Total: 0.4 mg/dL (ref 0.0–1.2)
CO2: 19 mmol/L — ABNORMAL LOW (ref 20–29)
Calcium: 9.2 mg/dL (ref 8.7–10.2)
Chloride: 103 mmol/L (ref 96–106)
Creatinine, Ser: 0.68 mg/dL (ref 0.57–1.00)
Globulin, Total: 2.6 g/dL (ref 1.5–4.5)
Glucose: 88 mg/dL (ref 70–99)
Potassium: 4 mmol/L (ref 3.5–5.2)
Sodium: 141 mmol/L (ref 134–144)
Total Protein: 7.1 g/dL (ref 6.0–8.5)
eGFR: 118 mL/min/1.73 (ref >59)

## 2024-06-17 LAB — T4, FREE: Free T4: 1.06 ng/dL (ref 0.82–1.77)

## 2024-06-17 NOTE — Telephone Encounter (Signed)
 LVM for patient notifying her about normal labs.

## 2024-06-17 NOTE — Telephone Encounter (Signed)
-----   Message from Tinnie MARLA Pro sent at 06/17/2024  8:43 AM EDT ----- Please let her know that overall her labs look good, but her cholesterol is still elevated and to continue to work on this ----- Message ----- From: Interface, [facility] Lab Results In Sent: 06/13/2024   7:36 AM EDT To: Tinnie MARLA Pro, PA-C

## 2024-07-09 NOTE — Progress Notes (Unsigned)
    NURSE VISIT NOTE  Subjective:    [name], female    DOB: 06-15-1991, 33 y.o.   MRN: [id number]  HPI  Patient is a 33 y.o. G14P0101 female who presents for evaluation of amenorrhea. She believes she could be pregnant. Pregnancy is desired. Sexual Activity: single partner, contraception: none. Current symptoms also include: frequent urination and positive home pregnancy test. Last period was normal. Patient reports concerns of a possible UTI and reports frequency/urgency.     Objective:    BP 113/73   Pulse 88   Ht 5' (1.524 m)   Wt 149 lb 6.4 oz (67.8 kg)   LMP 06/06/2024   BMI 29.18 kg/m   Lab Review  Results for orders placed or performed in visit on 07/10/24  POCT urine pregnancy  Result Value Ref Range   Preg Test, Ur Positive (A) Negative  POCT urinalysis dipstick  Result Value Ref Range   Color, UA yellow    Clarity, UA clear    Glucose, UA Negative Negative   Bilirubin, UA negative    Ketones, UA negative    Spec Grav, UA 1.010 1.010 - 1.025   Blood, UA negative    pH, UA 7.0 5.0 - 8.0   Protein, UA Negative Negative   Urobilinogen, UA 0.2 0.2 or 1.0 E.U./dL   Nitrite, UA negative    Leukocytes, UA Negative Negative   Appearance     Odor      Assessment:   1. Absence of menstruation   2. Urinary urgency   3. Encounter for antenatal screening for uncertain dates     Plan:   Pregnancy Test: Positive  Estimated Date of Delivery: 03/13/25 BP Cuff Measurement taken. Cuff Size Adult Small Encouraged well-balanced diet, plenty of rest when needed, pre-natal vitamins daily and walking for exercise.  Discussed self-help for nausea, avoiding OTC medications until consulting provider or pharmacist, other than Tylenol  as needed, minimal caffeine (1-2 cups daily) and avoiding alcohol.   She will schedule her nurse visit @ 7-[redacted] wks pregnant, u/s for dating @10  wk, and NOB visit at [redacted] wk pregnant.    Feel free to call with any  questions.    Rollo JINNY Maxin, CMA

## 2024-07-10 ENCOUNTER — Ambulatory Visit (INDEPENDENT_AMBULATORY_CARE_PROVIDER_SITE_OTHER)

## 2024-07-10 VITALS — BP 113/73 | HR 88 | Ht 60.0 in | Wt 149.4 lb

## 2024-07-10 DIAGNOSIS — Z3687 Encounter for antenatal screening for uncertain dates: Secondary | ICD-10-CM

## 2024-07-10 DIAGNOSIS — N912 Amenorrhea, unspecified: Secondary | ICD-10-CM

## 2024-07-10 DIAGNOSIS — Z3201 Encounter for pregnancy test, result positive: Secondary | ICD-10-CM

## 2024-07-10 DIAGNOSIS — R3915 Urgency of urination: Secondary | ICD-10-CM | POA: Diagnosis not present

## 2024-07-10 LAB — POCT URINALYSIS DIPSTICK
Bilirubin, UA: NEGATIVE
Blood, UA: NEGATIVE
Glucose, UA: NEGATIVE
Ketones, UA: NEGATIVE
Leukocytes, UA: NEGATIVE
Nitrite, UA: NEGATIVE
Protein, UA: NEGATIVE
Spec Grav, UA: 1.01 (ref 1.010–1.025)
Urobilinogen, UA: 0.2 U/dL
pH, UA: 7 (ref 5.0–8.0)

## 2024-07-10 LAB — POCT URINE PREGNANCY: Preg Test, Ur: POSITIVE — AB

## 2024-07-10 NOTE — Patient Instructions (Signed)

## 2024-07-16 ENCOUNTER — Ambulatory Visit

## 2024-07-16 ENCOUNTER — Encounter: Payer: Self-pay | Admitting: Certified Nurse Midwife

## 2024-07-16 ENCOUNTER — Other Ambulatory Visit: Payer: Self-pay | Admitting: Certified Nurse Midwife

## 2024-07-16 LAB — URINE CULTURE

## 2024-07-16 MED ORDER — NITROFURANTOIN MONOHYD MACRO 100 MG PO CAPS: 100.0000 mg | ORAL_CAPSULE | Freq: Two times a day (BID) | ORAL | 0 refills | Status: AC

## 2024-07-23 ENCOUNTER — Encounter: Payer: Self-pay | Admitting: Physician Assistant

## 2024-07-23 ENCOUNTER — Ambulatory Visit (INDEPENDENT_AMBULATORY_CARE_PROVIDER_SITE_OTHER): Admitting: Physician Assistant

## 2024-07-23 VITALS — BP 112/70 | HR 98 | Temp 98.5°F | Resp 16 | Ht 60.0 in | Wt 150.6 lb

## 2024-07-23 DIAGNOSIS — R300 Dysuria: Secondary | ICD-10-CM

## 2024-07-23 DIAGNOSIS — N760 Acute vaginitis: Secondary | ICD-10-CM

## 2024-07-23 DIAGNOSIS — R319 Hematuria, unspecified: Secondary | ICD-10-CM | POA: Diagnosis not present

## 2024-07-23 DIAGNOSIS — N390 Urinary tract infection, site not specified: Secondary | ICD-10-CM

## 2024-07-23 LAB — POCT URINALYSIS DIPSTICK
Bilirubin, UA: NEGATIVE
Blood, UA: POSITIVE
Glucose, UA: NEGATIVE
Ketones, UA: POSITIVE
Nitrite, UA: NEGATIVE
Protein, UA: POSITIVE — AB
Spec Grav, UA: 1.01 (ref 1.010–1.025)
Urobilinogen, UA: 0.2 U/dL
pH, UA: 5 (ref 5.0–8.0)

## 2024-07-23 MED ORDER — AMOXICILLIN 500 MG PO CAPS: 500.0000 mg | ORAL_CAPSULE | Freq: Three times a day (TID) | ORAL | 0 refills | Status: AC

## 2024-07-23 NOTE — Progress Notes (Signed)
 [facility] [address] [place], [place] [address]  Internal MEDICINE  Office Visit Note  Patient Name: Tina Rodgers  [id number]  [id number]  Date of Service: 07/23/2024  Chief Complaint  Patient presents with   Acute Visit   Recurrent UTI    Given Macrobid  from OB/GYN, not helping     HPI Pt is here for a sick visit. -burning and frequency -had a positive urine culture with OBGYN and was put on macrobid , but last tab tonight and has not seen any improvement in symptoms -she wasn't able to follow up with them for this concern for 2 weeks so they advised she be rechecked at PCP office. -she states she would also like to check for BV as she is prone to this and will follow up with OBGYN upon results -she is [redacted] weeks pregnant and reports overall going well so far -appt with OB on the 25th for virtual new OB intake, 9/16 is first scan, 9/26 for new OB in person Appt  Current Medication:  Outpatient Encounter Medications as of 07/23/2024  Medication Sig   albuterol  (VENTOLIN  HFA) 108 (90 Base) MCG/ACT inhaler TAKE 2 PUFFS BY MOUTH EVERY 6 HOURS AS NEEDED FOR WHEEZE OR SHORTNESS OF BREATH   amoxicillin  (AMOXIL ) 500 MG capsule Take 1 capsule (500 mg total) by mouth 3 (three) times daily for 3 days.   cholecalciferol (VITAMIN D3) 25 MCG (1000 UNIT) tablet Take 1,000 Units by mouth daily.   fluticasone  (FLONASE ) 50 MCG/ACT nasal spray SPRAY 2 SPRAYS INTO EACH NOSTRIL EVERY DAY   mirabegron  ER (MYRBETRIQ ) 25 MG TB24 tablet TAKE 1 TABLET (25 MG TOTAL) BY MOUTH DAILY.   Multiple Vitamins-Minerals (ONE-A-DAY WOMENS PO) Take by mouth.   nitrofurantoin , macrocrystal-monohydrate, (MACROBID ) 100 MG capsule Take 1 capsule (100 mg total) by mouth 2 (two) times daily for 7 days.   NON FORMULARY Vaginal probiotic tablet ( one tablet daily)   sertraline  (ZOLOFT ) 50 MG tablet TAKE 1 TABLET BY MOUTH EVERY DAY   triamcinolone  (KENALOG ) 0.025 % ointment Apply 1 Application  topically 2 (two) times daily.   No facility-administered encounter medications on file as of 07/23/2024.      Medical History: Past Medical History:  Diagnosis Date   ADD (attention deficit disorder)    Vitamin D deficiency      Vital Signs: BP 112/70   Pulse 98   Temp 98.5 F (36.9 C)   Resp 16   Ht 5' (1.524 m)   Wt 150 lb 9.6 oz (68.3 kg)   LMP 06/06/2024   SpO2 99%   BMI 29.41 kg/m    Review of Systems  Constitutional:  Negative for fatigue and fever.  HENT:  Negative for congestion, mouth sores and postnasal drip.   Respiratory:  Negative for cough.   Cardiovascular:  Negative for chest pain.  Gastrointestinal:  Negative for abdominal pain, diarrhea and vomiting.  Genitourinary:  Positive for dysuria, frequency and urgency.  Skin:  Negative for rash.  Neurological:  Negative for weakness and headaches.  Psychiatric/Behavioral: Negative.  Negative for confusion and suicidal ideas.     Physical Exam Vitals reviewed.  Constitutional:      General: She is not in acute distress.    Appearance: Normal appearance. She is not ill-appearing.  HENT:     Head: Normocephalic and atraumatic.  Eyes:     Pupils: Pupils are equal, round, and reactive to light.  Cardiovascular:     Rate and Rhythm: Normal rate  and regular rhythm.  Pulmonary:     Effort: Pulmonary effort is normal. No respiratory distress.     Breath sounds: Normal breath sounds.  Skin:    General: Skin is warm and dry.  Neurological:     Mental Status: She is alert.  Psychiatric:        Mood and Affect: Mood normal.        Behavior: Behavior normal.       Assessment/Plan: 1. Urinary tract infection with hematuria, site unspecified (Primary) Dip very positive still in office and will send for new culture. Will send amoxicillin  and pt will follow up with urology/OBGYN - Urine Culture - amoxicillin  (AMOXIL ) 500 MG capsule; Take 1 capsule (500 mg total) by mouth 3 (three) times daily for 3 days.   Dispense: 9 capsule; Refill: 0  2. Dysuria - POCT Urinalysis Dipstick  3. Acute vaginitis Pt performed self swab and will await results. She plans to discuss with OBGYN as well pending results - NuSwab Vaginitis Plus (VG+)   General Counseling: kadejah sandiford understanding of the findings of todays visit and agrees with plan of treatment. I have discussed any further diagnostic evaluation that may be needed or ordered today. We also reviewed her medications today. she has been encouraged to call the office with any questions or concerns that should arise related to todays visit.    Counseling:    Orders Placed This Encounter  Procedures   Urine Culture   NuSwab Vaginitis Plus (VG+)   POCT Urinalysis Dipstick    Meds ordered this encounter  Medications   amoxicillin  (AMOXIL ) 500 MG capsule    Sig: Take 1 capsule (500 mg total) by mouth 3 (three) times daily for 3 days.    Dispense:  9 capsule    Refill:  0    Time spent:30 Minutes

## 2024-07-25 LAB — URINE CULTURE

## 2024-07-27 ENCOUNTER — Telehealth

## 2024-07-27 DIAGNOSIS — Z3480 Encounter for supervision of other normal pregnancy, unspecified trimester: Secondary | ICD-10-CM

## 2024-07-27 NOTE — Progress Notes (Signed)
 New OB Intake  I connected with  [name] on 07/27/24 at  3:15 PM EDT by MyChart Video Visit and verified that I am speaking with the correct person using two identifiers. Nurse is located at [facility] and pt is located at work.  I discussed the limitations, risks, security and privacy concerns of performing an evaluation and management service by telephone and the availability of in person appointments. I also discussed with the patient that there may be a patient responsible charge related to this service. The patient expressed understanding and agreed to proceed.  I explained I am completing New OB Intake today. We discussed her EDD of 03/13/24  that is based on LMP of . Pt is G2/P1. I reviewed her allergies, medications, Medical/Surgical/OB history, and appropriate screenings. There are cats in the home: no.Her obstetrical history is significant for previous pregnancy delivered at 36 weeks..  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 Pt verbalize  understating.   Delivery Plans:  Plans to deliver at [facility].  Anatomy US  Explained first scheduled US  will be 08/18/24. Anatomy US  will be scheduled around [redacted] weeks gestational age.  Labs Discussed genetic screening with patient. Patient would like genetic testing to be drawn at new OB visit. Discussed possible labs to be drawn at new OB appointment.  COVID Vaccine Patient has had COVID vaccine.   Social Determinants of Health Food Insecurity: denies food insecurity Transportation: Patient denies transportation needs. Childcare: Discussed no children allowed at ultrasound appointments.   First visit review I reviewed new OB appt with pt. I explained she will have blood work and pap smear/pelvic exam if indicated. Explained pt will be seen by [facility], LYDIA CNM at first visit; encounter routed to appropriate provider.   Annalee VEAR Sanders, CMA 07/27/2024  3:46 PM

## 2024-07-28 ENCOUNTER — Ambulatory Visit: Payer: Self-pay | Admitting: Physician Assistant

## 2024-07-28 LAB — NUSWAB VAGINITIS PLUS (VG+)
Candida albicans, NAA: NEGATIVE
Candida glabrata, NAA: NEGATIVE
Chlamydia trachomatis, NAA: NEGATIVE
Neisseria gonorrhoeae, NAA: NEGATIVE

## 2024-07-28 NOTE — Telephone Encounter (Signed)
-----   Message from Tinnie MARLA Pro sent at 07/28/2024  8:50 AM EDT ----- Please let her know that her culture and swab were negative ----- Message ----- From: Almer Bi, CMA Sent: 07/23/2024   1:38 PM EDT To: Tinnie MARLA Pro, PA-C

## 2024-07-28 NOTE — Telephone Encounter (Signed)
Spoke with patient regarding labs.

## 2024-07-31 ENCOUNTER — Telehealth: Payer: Self-pay

## 2024-07-31 NOTE — Telephone Encounter (Signed)
 Pt called that she bite by Ant advised her call OB GYN

## 2024-07-31 NOTE — Telephone Encounter (Signed)
 Patient calling in with a concern of an ant bite. Reports she was bitten yesterday below her knee. States it is very red and swollen, she tried putting Triamcinolone  cream on it but I advised her to no longer use that while pregnant. We discussed multiple remedies for her to try and warning signs of when to call or go to Urgent care for further examination. All questions answered at this time.

## 2024-08-04 ENCOUNTER — Encounter: Payer: Self-pay | Admitting: Urology

## 2024-08-06 ENCOUNTER — Ambulatory Visit (INDEPENDENT_AMBULATORY_CARE_PROVIDER_SITE_OTHER)

## 2024-08-06 VITALS — BP 94/47 | HR 90 | Resp 16 | Ht 60.0 in | Wt 151.4 lb

## 2024-08-06 DIAGNOSIS — R300 Dysuria: Secondary | ICD-10-CM | POA: Diagnosis not present

## 2024-08-06 DIAGNOSIS — R102 Pelvic and perineal pain: Secondary | ICD-10-CM

## 2024-08-06 LAB — POCT URINALYSIS DIPSTICK OB
Bilirubin, UA: NEGATIVE
Glucose, UA: NEGATIVE
Ketones, UA: NEGATIVE
Leukocytes, UA: NEGATIVE
Nitrite, UA: NEGATIVE
Spec Grav, UA: 1.02 (ref 1.010–1.025)
Urobilinogen, UA: 0.2 U/dL
pH, UA: 6 (ref 5.0–8.0)

## 2024-08-06 NOTE — Progress Notes (Signed)
    NURSE VISIT NOTE  Subjective:    [name], female    DOB: December 17, 1990, 33 y.o.   MRN: [id number]       HPI  Patient is a 33 y.o. G73P0101 female who presents for pelvic pain and back pain for several days.  Patient denies dysuria, hematuria, and urinary urgency.  Patient does not have a history of recurrent UTI.  Patient does not have a history of pyelonephritis.    Objective:    LMP 06/06/2024    Lab Review  No results found for any visits on 08/06/24.  Assessment:   1. Dysuria   2. Pelvic pain      Plan:   Urine Culture Sent. Maintain adequate hydration.  May use AZO OTC prn.  Follow up if symptoms worsen or fail to improve as anticipated, and as needed.    Camelia Fetters, CMA [facility]

## 2024-08-08 LAB — URINE CULTURE

## 2024-08-11 ENCOUNTER — Ambulatory Visit: Admitting: Nurse Practitioner

## 2024-08-11 ENCOUNTER — Encounter: Payer: Self-pay | Admitting: Nurse Practitioner

## 2024-08-11 ENCOUNTER — Telehealth (INDEPENDENT_AMBULATORY_CARE_PROVIDER_SITE_OTHER): Admitting: Nurse Practitioner

## 2024-08-11 ENCOUNTER — Telehealth: Payer: Self-pay

## 2024-08-11 VITALS — Resp 16 | Ht 60.0 in | Wt 150.0 lb

## 2024-08-11 DIAGNOSIS — L290 Pruritus ani: Secondary | ICD-10-CM | POA: Diagnosis not present

## 2024-08-11 DIAGNOSIS — B80 Enterobiasis: Secondary | ICD-10-CM

## 2024-08-11 NOTE — Telephone Encounter (Signed)
 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 She expressed concern about potential exposure and having therm as well. I advised her to contact her primary care provider for further evaluation. Additionally, I will notify the on-call provider, as I am not familiar with the appropriate protocol for managing pinworms during pregnancy,would she need apt?

## 2024-08-11 NOTE — Progress Notes (Signed)
 [facility] [address] [place], [place] [address]  Internal MEDICINE  Telephone Visit  Patient Name: Tina Rodgers  [id number]  [id number]  Date of Service: 08/11/2024  I connected with the patient at 1320 by telephone and verified the patients identity using two identifiers.   I discussed the limitations, risks, security and privacy concerns of performing an evaluation and management service by telephone and the availability of in person appointments. I also discussed with the patient that there may be a patient responsible charge related to the service.  The patient expressed understanding and agrees to proceed.    Chief Complaint  Patient presents with   Telephone Screen    Exposed to pin worm ( pre-school from a kid)   Telephone Assessment    HPI Tina Rodgers presents for a telehealth virtual visit for exposure to pinworms Exposed to pinworms through a preschooler with a confirmed case at work.  Has not seen any pinworms in her stools but does endorse persistent rectal itching.     Current Medication: Outpatient Encounter Medications as of 08/11/2024  Medication Sig   albuterol  (VENTOLIN  HFA) 108 (90 Base) MCG/ACT inhaler TAKE 2 PUFFS BY MOUTH EVERY 6 HOURS AS NEEDED FOR WHEEZE OR SHORTNESS OF BREATH   cholecalciferol (VITAMIN D3) 25 MCG (1000 UNIT) tablet Take 1,000 Units by mouth daily.   fluticasone  (FLONASE ) 50 MCG/ACT nasal spray SPRAY 2 SPRAYS INTO EACH NOSTRIL EVERY DAY   mirabegron  ER (MYRBETRIQ ) 25 MG TB24 tablet TAKE 1 TABLET (25 MG TOTAL) BY MOUTH DAILY. (Patient not taking: Reported on 07/27/2024)   Multiple Vitamins-Minerals (ONE-A-DAY WOMENS PO) Take by mouth.   NON FORMULARY Vaginal probiotic tablet ( one tablet daily)   sertraline  (ZOLOFT ) 50 MG tablet TAKE 1 TABLET BY MOUTH EVERY DAY   triamcinolone  (KENALOG ) 0.025 % ointment Apply 1 Application topically 2 (two) times daily.   No facility-administered encounter medications on file as of  08/11/2024.    Surgical History: Past Surgical History:  Procedure Laterality Date   BREAST SURGERY     2 lumps removed- neg   COLONOSCOPY WITH PROPOFOL  N/A 04/25/2022   Procedure: COLONOSCOPY WITH PROPOFOL ;  Surgeon: Unk Corinn Skiff, MD;  Location: [facility] ENDOSCOPY;  Service: Gastroenterology;  Laterality: N/A;    Medical History: Past Medical History:  Diagnosis Date   ADD (attention deficit disorder)    Vitamin D deficiency     Family History: Family History  Problem Relation Age of Onset   Ovarian cancer Paternal Grandmother    Atrial fibrillation Father    Breast cancer Neg Hx    Colon cancer Neg Hx    Diabetes Neg Hx     Social History   Socioeconomic History   Marital status: Married    Spouse name: Tina Rodgers   Number of children: 1   Years of education: Not on file   Highest education level: Bachelor's degree (e.g., BA, AB, BS)  Occupational History   Occupation: Psychologist, occupational  Tobacco Use   Smoking status: Never   Smokeless tobacco: Never  Vaping Use   Vaping status: Never Used  Substance and Sexual Activity   Alcohol use: Not Currently    Comment: ocassionally   Drug use: No   Sexual activity: Yes  Other Topics Concern   Not on file  Social History Narrative   Not on file   Social Drivers of Health   Financial Resource Strain: Low Risk  (07/27/2024)   Overall Financial Resource Strain (CARDIA)  Difficulty of Paying Living Expenses: Not very hard  Food Insecurity: No Food Insecurity (07/27/2024)   Hunger Vital Sign    Worried About Running Out of Food in the Last Year: Never true    Ran Out of Food in the Last Year: Never true  Transportation Needs: No Transportation Needs (07/27/2024)   PRAPARE - Administrator, Civil Service (Medical): No    Lack of Transportation (Non-Medical): No  Physical Activity: Insufficiently Active (07/27/2024)   Exercise Vital Sign    Days of Exercise per Week: 3 days    Minutes of Exercise per Session: 30  min  Stress: No Stress Concern Present (07/27/2024)   [employer] of Occupational Health - Occupational Stress Questionnaire    Feeling of Stress: Only a little  Social Connections: Socially Integrated (07/27/2024)   Social Connection and Isolation Panel    Frequency of Communication with Friends and Family: Twice a week    Frequency of Social Gatherings with Friends and Family: Once a week    Attends Religious Services: More than 4 times per year    Active Member of [employer] or Organizations: Yes    Attends Banker Meetings: 1 to 4 times per year    Marital Status: Married  Catering manager Violence: Not At Risk (07/27/2024)   Humiliation, Afraid, Rape, and Kick questionnaire    Fear of Current or Ex-Partner: No    Emotionally Abused: No    Physically Abused: No    Sexually Abused: No      Review of Systems  Constitutional:  Negative for chills, fatigue and unexpected weight change.  HENT:  Negative for congestion, postnasal drip, rhinorrhea, sneezing and sore throat.   Eyes:  Negative for redness.  Respiratory: Negative.  Negative for cough, chest tightness, shortness of breath and wheezing.   Cardiovascular: Negative.  Negative for chest pain and palpitations.  Gastrointestinal:  Negative for abdominal pain, constipation, diarrhea, nausea and vomiting.       Rectal itching  Genitourinary:  Negative for dysuria and frequency.  Musculoskeletal: Negative.  Negative for arthralgias, back pain, joint swelling and neck pain.  Skin:  Negative for rash.  Neurological: Negative.  Negative for tremors and numbness.  Hematological:  Negative for adenopathy. Does not bruise/bleed easily.  Psychiatric/Behavioral:  Negative for behavioral problems (Depression), sleep disturbance and suicidal ideas. The patient is not nervous/anxious.     Vital Signs: Resp 16   Ht 5' (1.524 m)   Wt 150 lb (68 kg)   LMP 06/06/2024   BMI 29.29 kg/m    Observation/Objective: She is alert  and oriented. No acute distress noted.     Assessment/Plan: 1. Rectal itching (Primary) Stool test and pinwork test ordered via [facility] - Pinworm prep - Ova and parasite examination  2. Pinworm infection Stool test and pinwork test ordered via [facility]. Will wait until results are available to determine if treatment is needed.  - Pinworm prep - Ova and parasite examination   General Counseling: orlando thalmann understanding of the findings of today's phone visit and agrees with plan of treatment. I have discussed any further diagnostic evaluation that may be needed or ordered today. We also reviewed her medications today. she has been encouraged to call the office with any questions or concerns that should arise related to todays visit.  Return if symptoms worsen or fail to improve.   Orders Placed This Encounter  Procedures   Pinworm prep   Ova and parasite examination  No orders of the defined types were placed in this encounter.   Time spent:20 Minutes Time spent with patient included reviewing progress notes, labs, imaging studies, and discussing plan for follow up.  [place] Controlled Substance Database was reviewed by me for overdose risk score (ORS) if appropriate.  This patient was seen by Mardy Maxin, FNP-C in collaboration with Dr. Sigrid Bathe as a part of collaborative care agreement.  Batoul Limes R. Maxin, MSN, FNP-C Internal medicine

## 2024-08-18 ENCOUNTER — Other Ambulatory Visit

## 2024-08-18 ENCOUNTER — Ambulatory Visit (INDEPENDENT_AMBULATORY_CARE_PROVIDER_SITE_OTHER)

## 2024-08-18 DIAGNOSIS — Z3A10 10 weeks gestation of pregnancy: Secondary | ICD-10-CM

## 2024-08-18 DIAGNOSIS — Z3687 Encounter for antenatal screening for uncertain dates: Secondary | ICD-10-CM | POA: Diagnosis not present

## 2024-08-24 LAB — OVA AND PARASITE EXAMINATION

## 2024-08-26 ENCOUNTER — Telehealth: Payer: Self-pay

## 2024-08-26 NOTE — Telephone Encounter (Signed)
 Pt advised  that test is canceled for OVA and parasite as per labs they don't received samples and they left 1 test still active advised as per alyssa that talk to her gyn for further testing and also as per pt she no longer having any symptoms

## 2024-08-28 ENCOUNTER — Encounter: Admitting: Licensed Practical Nurse

## 2024-08-31 NOTE — Progress Notes (Unsigned)
 NEW OB HISTORY AND PHYSICAL  SUBJECTIVE:       Tina Rodgers is a 33 y.o. G73P0101 female, Patient's last menstrual period was 06/06/2024., Estimated Date of Delivery: 03/13/25, [redacted]w[redacted]d, presents today for establishment of Prenatal Care. This was a planned pregnancy She reports breast tenderness   There has been an outbreak of pinworms at work, she did give a stool sample to her PCP but the sample was lost. She would like to take the medication without giving another sample.   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  Exercise:walking Substance ldz:izwpzd tobacco, alcohol or illicit drug use   Indications for ASA therapy (per uptodate) One of the following: Previous pregnancy with preeclampsia, especially early onset and with an adverse outcome No Multifetal gestation No Chronic hypertension No Type 1 or 2 diabetes mellitus No Chronic kidney disease No Autoimmune disease (antiphospholipid syndrome, systemic lupus erythematosus) No  Two or more of the following: Nulliparity No Obesity (body mass index >30 kg/m2) No Family history of preeclampsia in mother or sister No Age >=35 years No Sociodemographic characteristics (African American race, low socioeconomic level) No Personal risk factors (eg, previous pregnancy with low birth weight or small for gestational age infant, previous adverse pregnancy outcome [eg, stillbirth], interval >10 years between pregnancies) No   Gynecologic History Patient's last menstrual period was 06/06/2024.  Irregular Contraception: none Last Pap: 2023. Results were: normal  Obstetric History OB History  Gravida Para Term Preterm AB Living  2 1  1  1   SAB IAB Ectopic Multiple Live Births     0 1    # Outcome Date GA Lbr Len/2nd Weight Sex Type Anes PTL Lv  2 Current           1 Preterm 07/15/18 [redacted]w[redacted]d 05:05 / 03:10 6 lb 5.6 oz (2.88 kg) F Vag-Spont Local  LIV    Past Medical History:  Diagnosis Date   ADD (attention deficit disorder)    Vitamin D deficiency     Past Surgical History:  Procedure Laterality Date   BREAST SURGERY     2 lumps removed- neg   COLONOSCOPY WITH PROPOFOL  N/A 04/25/2022   Procedure: COLONOSCOPY WITH PROPOFOL ;  Surgeon: Unk Corinn Skiff, MD;  Location: [facility] ENDOSCOPY;  Service: Gastroenterology;  Laterality: N/A;    Current Outpatient Medications on File Prior to Visit  Medication Sig Dispense Refill   albuterol  (VENTOLIN  HFA) 108 (90 Base) MCG/ACT inhaler TAKE 2 PUFFS BY MOUTH EVERY 6 HOURS AS NEEDED FOR WHEEZE OR SHORTNESS OF BREATH 18 each 3   fluticasone  (FLONASE ) 50 MCG/ACT nasal spray SPRAY 2 SPRAYS INTO EACH NOSTRIL EVERY DAY 48 mL 4   NON FORMULARY Vaginal probiotic tablet ( one tablet daily)     sertraline  (ZOLOFT ) 50 MG tablet TAKE 1 TABLET BY MOUTH EVERY DAY 90 tablet 2   cholecalciferol (VITAMIN D3) 25 MCG (1000 UNIT) tablet Take 1,000 Units by mouth daily. (Patient not taking: Reported on 09/01/2024)     mirabegron  ER (MYRBETRIQ ) 25 MG TB24 tablet TAKE 1  TABLET (25 MG TOTAL) BY MOUTH DAILY. (Patient not taking: Reported on 07/27/2024) 90 tablet 1   Multiple Vitamins-Minerals (ONE-A-DAY WOMENS PO) Take by mouth. (Patient not taking: Reported on 09/01/2024)     triamcinolone  (KENALOG ) 0.025 % ointment Apply 1 Application topically 2 (two) times daily. (Patient not taking: Reported on 09/01/2024) 30 g 0   No current facility-administered medications on file prior to visit.    Allergies  Allergen Reactions   Dust Mite Mixed Allergen Ext [Mite (D.  Farinae)]    Misc. Sulfonamide Containing Compounds Other (See Comments)   Sulfa Antibiotics Other (See Comments)    flush    Social History   Socioeconomic History   Marital status: Married    Spouse name: Dylan   Number of children: 1   Years of education: Not on file   Highest education level: Bachelor's degree (e.g., BA, AB, BS)  Occupational History   Occupation: Psychologist, occupational  Tobacco Use   Smoking status: Never   Smokeless tobacco: Never  Vaping Use   Vaping status: Never Used  Substance and Sexual Activity   Alcohol use: Not Currently    Comment: ocassionally   Drug use: No   Sexual activity: Yes  Other Topics Concern   Not on file  Social History Narrative   Not on file   Social Drivers of Health   Financial Resource Strain: Low Risk  (07/27/2024)   Overall Financial Resource Strain (CARDIA)    Difficulty of Paying Living Expenses: Not very hard  Food Insecurity: No Food Insecurity (07/27/2024)   Hunger Vital Sign    Worried About Running Out of Food in the Last Year: Never true    Ran Out of Food in the Last Year: Never true  Transportation Needs: No Transportation Needs (07/27/2024)   PRAPARE - Administrator, Civil Service (Medical): No    Lack of Transportation (Non-Medical): No  Physical Activity: Insufficiently Active (07/27/2024)   Exercise Vital Sign    Days of Exercise per Week: 3 days    Minutes of Exercise per Session: 30 min  Stress: No Stress Concern Present (07/27/2024)   [employer] of Occupational Health - Occupational Stress Questionnaire    Feeling of Stress: Only a little  Social Connections: Socially Integrated (07/27/2024)   Social Connection and Isolation Panel    Frequency of Communication with Friends and Family: Twice a week    Frequency of Social Gatherings with Friends and Family: Once a week    Attends Religious Services: More than 4 times per year    Active Member of [employer] or Organizations: Yes    Attends Occupational hygienist Meetings: 1 to 4 times per year    Marital Status: Married  Catering manager Violence: Not At Risk (07/27/2024)   Humiliation, Afraid, Rape, and Kick questionnaire    Fear of Current or Ex-Partner: No    Emotionally Abused: No    Physically Abused: No    Sexually Abused: No    Family History  Problem Relation Age of Onset   Ovarian cancer Paternal Grandmother    Atrial fibrillation Father    Breast cancer Neg Hx    Colon cancer Neg Hx    Diabetes Neg Hx     The following portions of the patient's history were reviewed and updated as appropriate: allergies, current medications, past OB history, past medical history, past surgical history, past family history, past social history, and problem list.  Constitutional: Denied constitutional symptoms, night sweats,  recent illness, fatigue, fever, insomnia and weight loss.  Eyes: Denied eye symptoms, eye pain, photophobia, vision change and visual disturbance.  Ears/Nose/Throat/Neck: Denied ear, nose, throat or neck symptoms, hearing loss, nasal discharge, sinus congestion and sore throat.  Cardiovascular: Denied cardiovascular symptoms, arrhythmia, chest pain/pressure, edema, exercise intolerance, orthopnea and palpitations.  Respiratory: Denied pulmonary symptoms, asthma, pleuritic pain, productive sputum, cough, dyspnea and wheezing.  Gastrointestinal: Denied gastro-esophageal reflux, melena, nausea and vomiting.  Genitourinary: Denied genitourinary symptoms including symptomatic vaginal discharge, pelvic relaxation issues, and urinary complaints.  Musculoskeletal: Denied musculoskeletal symptoms, stiffness, swelling, muscle weakness and myalgia.  Dermatologic: Denied dermatology symptoms, rash and scar.  Neurologic: Denied neurology symptoms, dizziness, headache, neck pain and syncope.  Psychiatric: Denied psychiatric symptoms, anxiety and depression.  Endocrine: Denied endocrine symptoms including hot flashes and night  sweats.     OBJECTIVE: Initial Physical Exam (New OB)  Physical Exam Constitutional:      Appearance: Normal appearance. She is normal weight.  Cardiovascular:     Rate and Rhythm: Normal rate and regular rhythm.     Pulses: Normal pulses.     Heart sounds: Normal heart sounds.  Pulmonary:     Effort: Pulmonary effort is normal.     Breath sounds: Normal breath sounds.  Chest:     Comments: Breasts: no redness or masses, nipples intact bilaterally  Abdominal:     Tenderness: There is no abdominal tenderness.     Comments: Heart tones 160  Musculoskeletal:     Cervical back: Normal range of motion and neck supple.  Skin:    General: Skin is warm.  Neurological:     Mental Status: She is alert.  Psychiatric:        Mood and Affect: Mood normal.        Thought Content: Thought content normal.     Fetal Heart Rate (bpm): 160  ASSESSMENT: Normal pregnancy   PLAN: Routine prenatal care. We discussed an overview of prenatal care and when to call. Reviewed diet, exercise, and weight gain recommendations in pregnancy. Discussed benefits of breastfeeding and lactation resources at [facility]. I reviewed labs and answered all questions.  1. Supervision of other normal pregnancy, antepartum (Primary) - NOB Panel - Culture, OB Urine - Monitor Drug Profile 14(MW) - Nicotine  screen, urine - Urinalysis, Routine w reflex microscopic - Hgb Fractionation Cascade - Protein / creatinine ratio, urine - Cervicovaginal ancillary only - MaterniT21 PLUS Core - US  OB Comp + 14 Wk; Future  2. [redacted] weeks gestation of pregnancy - NOB Panel - Culture, OB Urine - Monitor Drug Profile 14(MW) - Nicotine  screen, urine - Urinalysis, Routine w reflex microscopic - Hgb Fractionation Cascade - Protein / creatinine ratio, urine - Cervicovaginal ancillary only - MaterniT21 PLUS Core  3. Genetic screening - MaterniT21 PLUS Core  4. Encounter for drug screening - Monitor Drug Profile 14(MW)  5.  Screening examination for STD (sexually transmitted disease) - Cervicovaginal ancillary only  6. Encounter for antenatal screening - US  OB Comp + 14 Wk; Future  7. Hx of preterm delivery, currently pregnant   Daviel Allegretto M Latesa Fratto, CNM

## 2024-09-01 ENCOUNTER — Other Ambulatory Visit (HOSPITAL_COMMUNITY)
Admission: RE | Admit: 2024-09-01 | Discharge: 2024-09-01 | Disposition: A | Discharge status: Home / Self Care | Source: Ambulatory Visit | Attending: Licensed Practical Nurse | Admitting: Licensed Practical Nurse

## 2024-09-01 ENCOUNTER — Ambulatory Visit: Admitting: Licensed Practical Nurse

## 2024-09-01 VITALS — BP 117/72 | HR 103 | Wt 149.6 lb

## 2024-09-01 DIAGNOSIS — O09899 Supervision of other high risk pregnancies, unspecified trimester: Secondary | ICD-10-CM

## 2024-09-01 DIAGNOSIS — Z1379 Encounter for other screening for genetic and chromosomal anomalies: Secondary | ICD-10-CM

## 2024-09-01 DIAGNOSIS — Z369 Encounter for antenatal screening, unspecified: Secondary | ICD-10-CM

## 2024-09-01 DIAGNOSIS — Z3481 Encounter for supervision of other normal pregnancy, first trimester: Secondary | ICD-10-CM

## 2024-09-01 DIAGNOSIS — Z3480 Encounter for supervision of other normal pregnancy, unspecified trimester: Secondary | ICD-10-CM | POA: Insufficient documentation

## 2024-09-01 DIAGNOSIS — Z113 Encounter for screening for infections with a predominantly sexual mode of transmission: Secondary | ICD-10-CM | POA: Diagnosis present

## 2024-09-01 DIAGNOSIS — Z3A12 12 weeks gestation of pregnancy: Secondary | ICD-10-CM | POA: Insufficient documentation

## 2024-09-01 DIAGNOSIS — Z0283 Encounter for blood-alcohol and blood-drug test: Secondary | ICD-10-CM

## 2024-09-02 ENCOUNTER — Encounter: Payer: Self-pay | Admitting: Licensed Practical Nurse

## 2024-09-02 DIAGNOSIS — O09899 Supervision of other high risk pregnancies, unspecified trimester: Secondary | ICD-10-CM | POA: Insufficient documentation

## 2024-09-02 LAB — CBC/D/PLT+RPR+RH+ABO+RUBIGG...
Antibody Screen: NEGATIVE
Basophils Absolute: 0 x10E3/uL (ref 0.0–0.2)
Basos: 0 %
EOS (ABSOLUTE): 0.1 x10E3/uL (ref 0.0–0.4)
Eos: 1 %
HCV Ab: NONREACTIVE
HIV Screen 4th Generation wRfx: NONREACTIVE
Hematocrit: 40.3 % (ref 34.0–46.6)
Hemoglobin: 13.4 g/dL (ref 11.1–15.9)
Hepatitis B Surface Ag: NEGATIVE
Immature Grans (Abs): 0.1 x10E3/uL (ref 0.0–0.1)
Immature Granulocytes: 1 %
Lymphocytes Absolute: 2.1 x10E3/uL (ref 0.7–3.1)
Lymphs: 17 %
MCH: 31.3 pg (ref 26.6–33.0)
MCHC: 33.3 g/dL (ref 31.5–35.7)
MCV: 94 fL (ref 79–97)
Monocytes Absolute: 0.6 x10E3/uL (ref 0.1–0.9)
Monocytes: 5 %
Neutrophils Absolute: 8.9 x10E3/uL — ABNORMAL HIGH (ref 1.4–7.0)
Neutrophils: 76 %
Platelets: 294 x10E3/uL (ref 150–450)
RBC: 4.28 x10E6/uL (ref 3.77–5.28)
RDW: 12.2 % (ref 11.7–15.4)
RPR Ser Ql: NONREACTIVE
Rh Factor: NEGATIVE
Rubella Antibodies, IGG: 1.92 {index} (ref >0.99)
Varicella zoster IgG: REACTIVE
WBC: 11.8 x10E3/uL — ABNORMAL HIGH (ref 3.4–10.8)

## 2024-09-02 LAB — URINALYSIS, ROUTINE W REFLEX MICROSCOPIC
Bilirubin, UA: NEGATIVE
Glucose, UA: NEGATIVE
Ketones, UA: NEGATIVE
Leukocytes,UA: NEGATIVE
Nitrite, UA: NEGATIVE
Protein,UA: NEGATIVE
RBC, UA: NEGATIVE
Specific Gravity, UA: 1.014 (ref 1.005–1.030)
Urobilinogen, Ur: 0.2 mg/dL (ref 0.2–1.0)
pH, UA: 7.5 (ref 5.0–7.5)

## 2024-09-02 LAB — PROTEIN / CREATININE RATIO, URINE
Creatinine, Urine: 58.1 mg/dL
Protein, Ur: 6 mg/dL
Protein/Creat Ratio: 103 mg/g{creat} (ref 0–200)

## 2024-09-02 LAB — HCV INTERPRETATION

## 2024-09-03 ENCOUNTER — Ambulatory Visit (INDEPENDENT_AMBULATORY_CARE_PROVIDER_SITE_OTHER): Admitting: Certified Nurse Midwife

## 2024-09-03 ENCOUNTER — Other Ambulatory Visit (HOSPITAL_COMMUNITY)
Admission: RE | Admit: 2024-09-03 | Discharge: 2024-09-03 | Disposition: A | Discharge status: Home / Self Care | Source: Ambulatory Visit | Attending: Certified Nurse Midwife | Admitting: Certified Nurse Midwife

## 2024-09-03 ENCOUNTER — Telehealth: Payer: Self-pay

## 2024-09-03 VITALS — BP 102/66 | HR 92 | Wt 150.0 lb

## 2024-09-03 DIAGNOSIS — O360110 Maternal care for anti-D [Rh] antibodies, first trimester, not applicable or unspecified: Secondary | ICD-10-CM | POA: Diagnosis not present

## 2024-09-03 DIAGNOSIS — O209 Hemorrhage in early pregnancy, unspecified: Secondary | ICD-10-CM

## 2024-09-03 DIAGNOSIS — Z3A12 12 weeks gestation of pregnancy: Secondary | ICD-10-CM | POA: Diagnosis not present

## 2024-09-03 DIAGNOSIS — N939 Abnormal uterine and vaginal bleeding, unspecified: Secondary | ICD-10-CM

## 2024-09-03 DIAGNOSIS — Z6791 Unspecified blood type, Rh negative: Secondary | ICD-10-CM

## 2024-09-03 LAB — POCT URINALYSIS DIPSTICK
Bilirubin, UA: NEGATIVE
Blood, UA: NEGATIVE
Glucose, UA: NEGATIVE
Nitrite, UA: POSITIVE
Protein, UA: NEGATIVE
Spec Grav, UA: <=1.005 — AB (ref 1.010–1.025)
Urobilinogen, UA: >=8 U/dL — AB
pH, UA: 7.5 (ref 5.0–8.0)

## 2024-09-03 LAB — HGB FRACTIONATION CASCADE
Hgb A2: 2.4 % (ref 1.8–3.2)
Hgb A: 97.6 % (ref 96.4–98.8)
Hgb F: 0 % (ref 0.0–2.0)
Hgb S: 0 %

## 2024-09-03 LAB — CULTURE, OB URINE

## 2024-09-03 LAB — MONITOR DRUG PROFILE 14(MW)
Amphetamine Scrn, Ur: NEGATIVE ng/mL
BARBITURATE SCREEN URINE: NEGATIVE ng/mL
BENZODIAZEPINE SCREEN, URINE: NEGATIVE ng/mL
Buprenorphine, Urine: NEGATIVE ng/mL
CANNABINOIDS UR QL SCN: NEGATIVE ng/mL
Cocaine (Metab) Scrn, Ur: NEGATIVE ng/mL
Creatinine(Crt), U: 59.1 mg/dL (ref 20.0–300.0)
Fentanyl, Urine: NEGATIVE pg/mL
Meperidine Screen, Urine: NEGATIVE ng/mL
Methadone Screen, Urine: NEGATIVE ng/mL
OXYCODONE+OXYMORPHONE UR QL SCN: NEGATIVE ng/mL
Opiate Scrn, Ur: NEGATIVE ng/mL
Ph of Urine: 6.9 (ref 4.5–8.9)
Phencyclidine Qn, Ur: NEGATIVE ng/mL
Propoxyphene Scrn, Ur: NEGATIVE ng/mL
SPECIFIC GRAVITY: 1.013
Tramadol Screen, Urine: NEGATIVE ng/mL

## 2024-09-03 LAB — CERVICOVAGINAL ANCILLARY ONLY
Chlamydia: NEGATIVE
Comment: NEGATIVE
Comment: NEGATIVE
Comment: NORMAL
Neisseria Gonorrhea: NEGATIVE
Trichomonas: NEGATIVE

## 2024-09-03 LAB — NICOTINE SCREEN, URINE: Cotinine Ql Scrn, Ur: NEGATIVE ng/mL

## 2024-09-03 LAB — URINE CULTURE, OB REFLEX

## 2024-09-03 MED ORDER — RHO D IMMUNE GLOBULIN 1500 UNIT/2ML IJ SOSY
300.0000 ug | PREFILLED_SYRINGE | Freq: Once | INTRAMUSCULAR | Status: AC
  Administered 2024-09-03: 300 ug via INTRAMUSCULAR

## 2024-09-03 NOTE — Telephone Encounter (Signed)
 Patient called with concerns of bleeding when she wipes and small drops of blood in her urine when she urinates. She has also been having some mild cramping. She is so concerned and wants to come in for evaluation. I told her that we had no openings. I also advised her to monitor bleeding and if she started soaking a pad every 30 minutes to 1 hour and cramping increased to go to L&D for evaluation. I asked Tonya to ask you if she could have your 4:15 slot. Please advise. I can also ask her to go to [facility] for evaluation.

## 2024-09-04 ENCOUNTER — Other Ambulatory Visit: Payer: Self-pay

## 2024-09-04 ENCOUNTER — Telehealth: Payer: Self-pay

## 2024-09-04 DIAGNOSIS — O209 Hemorrhage in early pregnancy, unspecified: Secondary | ICD-10-CM | POA: Insufficient documentation

## 2024-09-04 DIAGNOSIS — R8290 Unspecified abnormal findings in urine: Secondary | ICD-10-CM

## 2024-09-04 MED ORDER — NITROFURANTOIN MONOHYD MACRO 100 MG PO CAPS: 100.0000 mg | ORAL_CAPSULE | Freq: Two times a day (BID) | ORAL | 0 refills | Status: DC

## 2024-09-04 MED ORDER — AMOXICILLIN 500 MG PO CAPS: 500.0000 mg | ORAL_CAPSULE | Freq: Two times a day (BID) | ORAL | 0 refills | Status: DC

## 2024-09-04 NOTE — Telephone Encounter (Signed)
 Received call on triage line from patient who was seen yesterday for bleeding in early pregnancy.  She was also having urinary symptoms and UA showed +3 leukocytes.  Urine sent for culture.  She called today to report worsening dysuria.  Rx for Macrobid  sent to [facility] in [place].  Advised we may need to change antibiotics based on culture results, but I do not want her going all weekend with a UTI while pregnant.  We will call if any antibiotic changes are needed.

## 2024-09-04 NOTE — Assessment & Plan Note (Signed)
 Given friability on cervix source of bleeding likely outside uterus. FHT easily found today & reassuring. Reviewed that she will likely continue to see pink/red/brown discharge with wiping. Will treat for infection based on results.

## 2024-09-04 NOTE — Progress Notes (Signed)
    Problem Prenatal Note   Subjective   33 y.o. G2P0101 at [redacted]w[redacted]d presents for this problem prenatal visit.  Patient reports episode of bleeding at 2am today after void. Feeling some cramping like light period cramps. Bleeding has lessened.  Patient reports: Movement: Absent  Objective   Flow sheet Vitals: Pulse Rate: 92 BP: 102/66 Fetal Heart Rate (bpm): 150 Total weight gain: 4 lb (1.814 kg)  General Appearance  No acute distress, well appearing, and well nourished Pulmonary   Normal work of breathing Neurologic   Alert and oriented to person, place, and time Psychiatric   Mood and affect within normal limits   Assessment/Plan   Plan  33 y.o. G2P0101 at [redacted]w[redacted]d presents for follow-up OB visit. Reviewed prenatal record including previous visit note.  Vaginal bleeding affecting early pregnancy Given friability on cervix source of bleeding likely outside uterus. FHT easily found today & reassuring. Reviewed that she will likely continue to see pink/red/brown discharge with wiping. Will treat for infection based on results.      Orders Placed This Encounter  Procedures   Urine Culture   POCT Urinalysis Dipstick   No follow-ups on file.   Future Appointments  Date Time Provider Department Center  09/29/2024  3:15 PM Jayne Harlene CROME, CNM [facility] None  10/13/2024  9:15 AM [facility] US  1 [facility] None  02/02/2025 10:00 AM Stoioff, Glendia BROCKS, MD [facility] None  06/10/2025 10:40 AM McDonough, [name], PA-C [facility] None    For next visit:  continue with routine prenatal care     Harlene CROME Jayne, CNM

## 2024-09-04 NOTE — Progress Notes (Signed)
 Patient says Macrobid  has not worked for her in the past.  She requests Amoxicillin  instead.  Macrobid  rx discontinued and amoxicillin  sent instead.

## 2024-09-05 LAB — URINE CULTURE

## 2024-09-06 LAB — MATERNIT 21 PLUS CORE, BLOOD
Fetal Fraction: 19
Result (T21): NEGATIVE
Trisomy 13 (Patau syndrome): NEGATIVE
Trisomy 18 (Edwards syndrome): NEGATIVE
Trisomy 21 (Down syndrome): NEGATIVE

## 2024-09-07 ENCOUNTER — Ambulatory Visit: Payer: Self-pay | Admitting: Certified Nurse Midwife

## 2024-09-07 ENCOUNTER — Other Ambulatory Visit: Payer: Self-pay | Admitting: Licensed Practical Nurse

## 2024-09-07 DIAGNOSIS — O4692 Antepartum hemorrhage, unspecified, second trimester: Secondary | ICD-10-CM

## 2024-09-07 LAB — CERVICOVAGINAL ANCILLARY ONLY
Bacterial Vaginitis (gardnerella): NEGATIVE
Candida Glabrata: NEGATIVE
Candida Vaginitis: NEGATIVE
Comment: NEGATIVE
Comment: NEGATIVE
Comment: NEGATIVE

## 2024-09-09 MED ORDER — AZITHROMYCIN 500 MG PO TABS: 1000.0000 mg | ORAL_TABLET | Freq: Once | ORAL | 0 refills | Status: AC

## 2024-09-09 NOTE — Telephone Encounter (Signed)
 Patient with continued spotting/bleeding, dysuria & vaginal pain. Aptima & urine culture negative for infection 10/2, friability noted on exam. Recommend Azithromycin  1g po once. As well as return to office for exam if bleeding has increased.

## 2024-09-11 ENCOUNTER — Ambulatory Visit
Admission: RE | Admit: 2024-09-11 | Discharge: 2024-09-11 | Disposition: A | Discharge status: Home / Self Care | Source: Ambulatory Visit | Attending: Licensed Practical Nurse | Admitting: Licensed Practical Nurse

## 2024-09-11 DIAGNOSIS — O4692 Antepartum hemorrhage, unspecified, second trimester: Secondary | ICD-10-CM | POA: Insufficient documentation

## 2024-09-15 ENCOUNTER — Encounter: Admitting: Registered Nurse

## 2024-09-17 ENCOUNTER — Ambulatory Visit (INDEPENDENT_AMBULATORY_CARE_PROVIDER_SITE_OTHER)

## 2024-09-17 ENCOUNTER — Ambulatory Visit: Payer: Self-pay | Admitting: Licensed Practical Nurse

## 2024-09-17 VITALS — BP 113/70 | HR 89 | Ht 60.0 in | Wt 150.7 lb

## 2024-09-17 DIAGNOSIS — R300 Dysuria: Secondary | ICD-10-CM | POA: Diagnosis not present

## 2024-09-17 LAB — POCT URINALYSIS DIPSTICK OB
Bilirubin, UA: NEGATIVE
Blood, UA: NEGATIVE
Glucose, UA: NEGATIVE
Ketones, UA: NEGATIVE
Leukocytes, UA: NEGATIVE
Nitrite, UA: NEGATIVE
POC,PROTEIN,UA: NEGATIVE
Spec Grav, UA: 1.01 (ref 1.010–1.025)
Urobilinogen, UA: 0.2 U/dL
pH, UA: 6.5 (ref 5.0–8.0)

## 2024-09-17 NOTE — Progress Notes (Signed)
    NURSE VISIT NOTE  Subjective:    [name], female    DOB: 17-Jun-1991, 33 y.o.   MRN: [id number]       HPI  Patient is a 33 y.o. G35P0101 female who presents for dysuria, urinary urgency, flank pain, cloudy malordorous urine, and vaginal discharge for 1 week.  Patient denies abdominal pain, pelvic pain, genital rash, and genital irritation.  Patient does have a history of recurrent UTI.  Patient does not have a history of pyelonephritis.    Objective:    BP 113/70   Pulse 89   Ht 5' (1.524 m)   Wt 150 lb 11.2 oz (68.4 kg)   LMP 06/06/2024   BMI 29.43 kg/m    Lab Review  Results for orders placed or performed in visit on 09/17/24  POC Urinalysis Dipstick OB  Result Value Ref Range   Color, UA yellow    Clarity, UA clear    Glucose, UA Negative Negative   Bilirubin, UA negative    Ketones, UA negative    Spec Grav, UA 1.010 1.010 - 1.025   Blood, UA negative    pH, UA 6.5 5.0 - 8.0   POC,PROTEIN,UA Negative Negative, Trace, Small (1+), Moderate (2+), Large (3+), 4+   Urobilinogen, UA 0.2 0.2 or 1.0 E.U./dL   Nitrite, UA negative    Leukocytes, UA Negative Negative   Appearance     Odor      Assessment:   1. Dysuria      Plan:   Urine Culture Sent. Maintain adequate hydration.  Follow up if symptoms worsen or fail to improve as anticipated, and as needed.    Rollo JINNY Maxin, CMA

## 2024-09-17 NOTE — Patient Instructions (Signed)
 Dysuria Dysuria is pain or discomfort when you pee. The pain may be felt in your urethra, which is the part of your body that drains pee (urine) from your bladder. The pain may also be felt near your genitals, groin, or in your lower belly or back. You may have to pee often or have the sudden feeling that you need to pee. This condition can affect anyone, but it's more common in females. It can be caused by: A urinary tract infection (UTI). Kidney stones or bladder stones. Some sexually transmitted infections (STIs). Dehydration. This is when there's not enough water in your body. Irritation and swelling in the vagina. The use of some medicines. The use of some soaps or products with a scent. Follow these instructions at home: Medicines  Take your medicines only as told. Take your antibiotics as told. Do not stop taking them even if you start to feel better. Eating and drinking Drink enough fluid to keep your pee pale yellow. Certain drinks can make the pain worse. Avoid: Drinks with caffeine in them. Tea. Alcohol. In males, alcohol may irritate the prostate. General instructions Watch your condition for any changes, such as color changes in your pee. Pee often. Do not hold your pee for a long time. If you're female, wipe from front to back after you pee or poop. Use each tissue only once when you wipe. Pee after you have sex. If you've had any tests done, it's up to you to get your test results. Ask your health care provider, or the department doing the test, when your results will be ready. Contact a health care provider if: You have a fever or chills. You have pain in your back or sides. You throw up or feel like you may throw up. You have blood in your pee. You're not peeing as often as normal. You feel very weak. Get help right away if: You have very bad pain that doesn't get better with medicine. You're confused. You have a fast heartbeat while resting. This information is  not intended to replace advice given to you by your health care provider. Make sure you discuss any questions you have with your health care provider. Document Revised: 03/26/2023 Document Reviewed: 03/26/2023 Elsevier Patient Education  2024 [employer].

## 2024-09-21 ENCOUNTER — Other Ambulatory Visit: Payer: Self-pay | Admitting: Licensed Practical Nurse

## 2024-09-21 DIAGNOSIS — R8290 Unspecified abnormal findings in urine: Secondary | ICD-10-CM

## 2024-09-21 DIAGNOSIS — N390 Urinary tract infection, site not specified: Secondary | ICD-10-CM

## 2024-09-21 LAB — URINE CULTURE

## 2024-09-21 MED ORDER — AMOXICILLIN 500 MG PO CAPS: 500.0000 mg | ORAL_CAPSULE | Freq: Two times a day (BID) | ORAL | 0 refills | Status: DC

## 2024-09-21 NOTE — Progress Notes (Signed)
 Pt seen for urinary complaints, urine cultures shows Enterococcus faecalis  10-25K, TC to pt, pt  reports she continues to have dysuria. Desires treatment.  Script for Amoxicillin  sent.  Jinnie Cookey, CNM  [facility] 09/21/24  12:18 PM

## 2024-09-29 ENCOUNTER — Ambulatory Visit: Admitting: Certified Nurse Midwife

## 2024-09-29 VITALS — BP 108/55 | HR 99 | Wt 154.4 lb

## 2024-09-29 DIAGNOSIS — Z3A16 16 weeks gestation of pregnancy: Secondary | ICD-10-CM

## 2024-09-29 DIAGNOSIS — Z3480 Encounter for supervision of other normal pregnancy, unspecified trimester: Secondary | ICD-10-CM

## 2024-09-29 DIAGNOSIS — O2342 Unspecified infection of urinary tract in pregnancy, second trimester: Secondary | ICD-10-CM | POA: Diagnosis not present

## 2024-09-29 DIAGNOSIS — O26891 Other specified pregnancy related conditions, first trimester: Secondary | ICD-10-CM

## 2024-09-29 DIAGNOSIS — O2340 Unspecified infection of urinary tract in pregnancy, unspecified trimester: Secondary | ICD-10-CM | POA: Insufficient documentation

## 2024-09-29 DIAGNOSIS — Z13228 Encounter for screening for other metabolic disorders: Secondary | ICD-10-CM

## 2024-09-29 NOTE — Progress Notes (Signed)
    Return Prenatal Note   Subjective   33 y.o. G2P0101 at [redacted]w[redacted]d presents for this follow-up prenatal visit.  Patient feeling well, completed antibiotics for UTI Patient reports: Movement: Present Contractions: Not present  Objective   Flow sheet Vitals: Pulse Rate: 99 BP: (!) 108/55 Fetal Heart Rate (bpm): 145 Total weight gain: 8 lb 6.4 oz (3.81 kg)  General Appearance  No acute distress, well appearing, and well nourished Pulmonary   Normal work of breathing Neurologic   Alert and oriented to person, place, and time Psychiatric   Mood and affect within normal limits   Assessment/Plan   Plan  33 y.o. G2P0101 at [redacted]w[redacted]d presents for follow-up OB visit. Reviewed prenatal record including previous visit note.  Supervision of other normal pregnancy, antepartum Red flag symptoms reviewed. Anatomy ultrasound scheduled. Comfort measures for sleep discussed. AFP today.  UTI (urinary tract infection) during pregnancy Nurse visit for test of cure scheduled.      Orders Placed This Encounter  Procedures   AFP TETRA    Is patient insulin dependent?:   No    Weight (lbs):   154    Gestational Age (GA), weeks:   16.3    Date on which patient was at this GA:   09/29/2024    GA Calculation Method:   Ultrasound    GA Date:   09/11/2024    Number of fetuses:   1    Reason for screen:   OTHER    Release to patient:   Immediate [1]   Return in 4 weeks (on 10/27/2024) for ROB & Ultrasound.   Future Appointments  Date Time Provider Department Center  10/05/2024  4:15 PM AOB-NURSE [facility] None  10/20/2024  1:00 PM [facility] US  1 [facility] None  10/20/2024  2:35 PM Slaughterbeck, Damien, CNM [facility] None  02/02/2025 10:00 AM Stoioff, Glendia BROCKS, MD [facility] None  06/10/2025 10:40 AM McDonough, [name], PA-C [facility] None    For next visit:  continue with routine prenatal care     Harlene LITTIE Cisco, CNM  10/28/254:30 PM

## 2024-09-29 NOTE — Assessment & Plan Note (Signed)
 Nurse visit for test of cure scheduled.

## 2024-09-29 NOTE — Assessment & Plan Note (Signed)
 Red flag symptoms reviewed. Anatomy ultrasound scheduled. Comfort measures for sleep discussed. AFP today.

## 2024-09-29 NOTE — Patient Instructions (Signed)
 Second Trimester of Pregnancy  The second trimester of pregnancy is from week 14 through week 27. This is months 4 through 6 of pregnancy. During the second trimester: Morning sickness is less or has stopped. You may have more energy. You may feel hungry more often. At this time, your unborn baby is growing very fast. At the end of the sixth month, the unborn baby may be up to 12 inches long and weigh about 1 pounds. You will likely start to feel the baby move between 16 and 20 weeks of pregnancy. Body changes during your second trimester Your body continues to change during this time. The changes usually go away after your baby is born. Physical changes You will gain more weight. Your belly will get bigger. You may begin to get stretch marks on your hips, belly, and breasts. Your breasts will keep growing and may hurt. You may get dark spots or blotches on your face. A dark line from your belly button to the pubic area may appear. This line is called linea nigra. Your hair may grow faster and get thicker. Health changes You may have headaches. You may have heartburn. You may pee more often. You may have swollen, bulging veins (varicose veins). You may have trouble pooping (constipation), or swollen veins in the butt that can itch or get painful (hemorrhoids). You may have back pain. This is caused by: Weight gain. Pregnancy hormones that are relaxing the joints in your pelvis. Follow these instructions at home: Medicines Talk to your health care provider if you're taking medicines. Ask if the medicines are safe to take during pregnancy. Your provider may change the medicines that you take. Do not take any medicines unless told to by your provider. Take a prenatal vitamin that has at least 600 micrograms (mcg) of folic acid. Do not use herbal medicines, illegal drugs, or medicines that are not approved by your provider. Eating and drinking While you're pregnant your body needs  extra food for your growing baby. Talk with your provider about what to eat while pregnant. Activity Most women are able to exercise during pregnancy. Exercises may need to change as your pregnancy goes on. Talk to your provider about your activities and exercise routines. Relieving pain and discomfort Wear a good, supportive bra if your breasts hurt. Rest with your legs raised if you have leg cramps or low back pain. Take warm sitz baths to soothe pain from hemorrhoids. Use hemorrhoid cream if your provider says it's okay. Do not douche. Do not use tampons or scented pads. Do not use hot tubs, steam rooms, or saunas. Safety Wear your seatbelt at all times when you're in a car. Talk to your provider if someone hits you, hurts you, or yells at you. Talk with your provider if you're feeling sad or have thoughts of hurting yourself. Lifestyle Certain things can be harmful while you're pregnant. It's best to avoid the following: Do not drink alcohol,smoke, vape, or use products with nicotine  or tobacco in them. If you need help quitting, talk with your provider. Avoid cat litter boxes and soil used by cats. These things carry germs that can cause harm to your pregnancy and your baby. General instructions Keep all follow-up visits. It helps you and your unborn baby stay as healthy as possible. Write down your questions. Take them to your prenatal visits. Your provider will: Talk with you about your overall health. Give you advice or refer you to specialists who can help with different needs,  including: Prenatal education classes. Mental health and counseling. Foods and healthy eating. Ask for help if you need help with food. Where to find more information American Pregnancy Association: americanpregnancy.org [employer]: acog.org Office on [employer] Health: TravelLesson.ca Contact a health care provider if: You have a headache that does not go away  when you take medicine. You have any of these problems: You can't eat or drink. You throw up or feel like you may throw up. You have watery poop (diarrhea) for 2 days or more. You have pain when you pee or your pee smells bad. You have been sick for 2 days or more and are not getting better. Contact your provider right away if: You have any of these coming from your vagina: Abnormal discharge. Bad-smelling fluid. Bleeding. Your baby is moving less than usual. You have contractions, belly cramping, or have pain in your pelvis or lower back. You have symptoms of high blood pressure or preeclampsia. These include: A severe, throbbing headache that does not go away. Sudden or extreme swelling of your face, hands, legs, or feet. Vision problems: You see spots. You have blurry vision. Your eyes are sensitive to light. If you can't reach the provider, go to an urgent care or emergency room. Get help right away if: You faint, become confused, or can't think clearly. You have chest pain or trouble breathing. You have any kind of injury, such as from a fall or a car crash. These symptoms may be an emergency. Call 911 right away. Do not wait to see if the symptoms will go away. Do not drive yourself to the hospital. This information is not intended to replace advice given to you by your health care provider. Make sure you discuss any questions you have with your health care provider. Document Revised: 08/22/2023 Document Reviewed: 03/22/2023 Elsevier Patient Education  2024 Elsevier Inc[employer] Problems to Watch for During Pregnancy During pregnancy, your body goes through many changes. Some changes may be uncomfortable. But most changes are not a serious problem. It's important to learn when certain signs and symptoms may be a problem. Talk with your health care provider about any medical conditions you have. Make sure you know the symptoms to watch for. Reporting problems early will prevent  complications. Problems to watch for during pregnancy You're more likely to get an infection during pregnancy. Let your provider know if you have signs of infection, such as: A fever. A bad-smelling fluid from your vagina. Peeing too often, wanting to pee urgently, or pain when you pee. Also, let your provider know if: You're very tired, you feel dizzy, or you faint. You have watery poop (diarrhea) for 24 hours or longer. You throw up or feel like throwing up for 24 hours or longer. You have cramping in your belly or have pain in your hips or lower back. You have spotting, bleeding, or leaking of fluid from your vagina. You have pain, swelling, or redness in an arm or leg. You should also watch for signs of high blood pressure and preeclampsia. These signs can be very serious. They include: A headache that doesn't go away when you take medicine. Sudden or very bad swelling of your face, hands, legs, or feet. Problems seeing, such as: You see spots. You have blurry vision. You may be sensitive to light. Why it's important to watch for these problems Watching and reporting problems to your provider can help prevent complications that may affect you and your baby. These  include: Higher risk of giving birth early. Infection that may be passed on to your baby. Higher risk for stillbirth. Follow these instructions at home:  Take your medicines only as told. Keep all follow-up visits. Your provider needs to monitor your health and your baby's health. Where to find more information To learn more, go to these websites: Centers for Disease Control and Prevention (CDC) at TonerPromos.no. Then: Click Health Topics A-Z. Type urgent maternal warning signs in the search box. [employer]ologists (ACOG): acog.org Contact a health care provider if: You have any problems while you're pregnant. You feel your baby moving less than usual. You have any of these things: You  have strong emotions, such as sadness or anxiety, that affect your daily life. You do not feel safe in your home. You use tobacco, alcohol, or drugs, and you need help to stop. Get help right away if: You faint, have a seizure, or cannot think clearly. You have chest pain or difficulty breathing. You have any of the following symptoms and you were unable to reach your provider: You have symptoms of infection, including a fever, or have vaginal bleeding. You have symptoms of high blood pressure or preeclampsia. You have signs or symptoms of labor before 37 weeks of pregnancy. These include: Contractions that are 5 minutes or less apart, or that increase in frequency, intensity, or length. Sudden, sharp pain in the belly, or low back pain. Any amount of fluid that flows from your vagina without stopping. These symptoms may be an emergency. Call 911 right away. Do not wait to see if the symptoms will go away. Do not drive yourself to the hospital. This information is not intended to replace advice given to you by your health care provider. Make sure you discuss any questions you have with your health care provider. Document Revised: 04/30/2023 Document Reviewed: 04/30/2023 Elsevier Patient Education  2024 [employer].

## 2024-10-01 ENCOUNTER — Encounter: Payer: Self-pay | Admitting: Certified Nurse Midwife

## 2024-10-01 ENCOUNTER — Other Ambulatory Visit: Payer: Self-pay

## 2024-10-01 DIAGNOSIS — O285 Abnormal chromosomal and genetic finding on antenatal screening of mother: Secondary | ICD-10-CM

## 2024-10-01 LAB — AFP TETRA
DIA Mom Value: 1.94
DIA Value (EIA): 297.99 pg/mL
DSR (By Age)    1 IN: 338
DSR (Second Trimester) 1 IN: 133
Gestational Age: 16.3 wk
MSAFP Mom: 0.88
MSAFP: 29.4 ng/mL
MSHCG Mom: 3.24
MSHCG: 125451 m[IU]/mL
Maternal Age At EDD: 34.4 a
Osb Risk: 10000
T18 (By Age): 1:1319 {titer}
Test Results:: POSITIVE — AB
Weight: 154 [lb_av]
uE3 Mom: 0.84
uE3 Value: 0.95 ng/mL

## 2024-10-01 NOTE — Progress Notes (Unsigned)
 AFP

## 2024-10-02 ENCOUNTER — Ambulatory Visit: Payer: Self-pay | Admitting: Certified Nurse Midwife

## 2024-10-05 ENCOUNTER — Ambulatory Visit (INDEPENDENT_AMBULATORY_CARE_PROVIDER_SITE_OTHER)

## 2024-10-05 VITALS — BP 113/56 | HR 65 | Ht 60.0 in | Wt 153.6 lb

## 2024-10-05 DIAGNOSIS — N390 Urinary tract infection, site not specified: Secondary | ICD-10-CM

## 2024-10-05 DIAGNOSIS — O2342 Unspecified infection of urinary tract in pregnancy, second trimester: Secondary | ICD-10-CM

## 2024-10-05 NOTE — Patient Instructions (Signed)
 Pregnancy and Urinary Tract Infection  A urinary tract infection (UTI) is an infection of any part of the urinary tract. This includes the kidneys, the tubes that connect the kidneys to the bladder (ureters), the bladder, and the tube that carries urine out of the body (urethra). These organs make, store, and get rid of urine in the body. Your health care provider may use other names to describe the infection. An upper UTI affects the ureters and kidneys (pyelonephritis). A lower UTI affects the bladder (cystitis) and urethra (urethritis). Most UTIs are caused by bacteria in the genital area, around the entrance to the urinary tract. These bacteria grow and cause irritation and inflammation of the urinary tract. You are more likely to develop a UTI during pregnancy because: The physical and hormonal changes that your body goes through make it easier for bacteria to get into your urinary tract. Your growing baby puts pressure on your bladder and can affect urine flow. Pregnant women with diabetes are at an increased risk for developing a UTI. It is important to recognize and treat UTIs in pregnancy because they can cause serious complications for both you and your baby. How does this affect me? Symptoms of a UTI include: Needing to urinate right away (urgently) and often, even if urinating a small amount. Pain, burning, or having a hard time passing urine. Blood in the urine. Unusual, cloudy, and bad-smelling urine. Pain in the abdomen or lower back. Vaginal discharge. You may also have: Vomiting or a decreased appetite. Confusion. Irritability or tiredness. A fever. Diarrhea. A low level of red blood cells (anemia). The development of high blood pressure during pregnancy (preeclampsia). How does this affect my baby? An untreated UTI during pregnancy could lead to a kidney infection or an infection throughout the mother's body (systemic infection). This can cause health problems and affect the  baby. Possible complications of an untreated UTI include: Your baby being born before 37 weeks of pregnancy (premature). Your baby being born with a low birth weight. Your baby having a higher risk of having his or her skin or the white parts of the eyes turn yellow (jaundice). What can I do to lower my risk? To prevent a UTI: Do not hold urine for long periods of time. Empty your bladder as soon as you feel the urge. Always wipe from front to back, especially after a bowel movement. Use each tissue one time when you wipe. Empty your bladder after sex. Keep your genital area dry. Drink 6 to 8 glasses of water each day. Do not douche or use deodorant sprays. Wear cotton underwear and loose clothing. How is this treated? Treatment for this condition may include: Antibiotic medicines that are safe to take during pregnancy. Other medicines to treat less common causes of UTI. Follow these instructions at home: If you were prescribed an antibiotic medicine, take it as told by your health care provider. Do not stop using the antibiotic even if you start to feel better. Keep all follow-up visits. This is important. Contact a health care provider if: Your symptoms do not improve or they get worse. You have abnormal vaginal discharge. Get help right away if you: Have a fever. Have nausea and vomiting. Have back or side pain. Have lower belly pain, tightness, or feel contractions in your uterus. Have a gush of fluid from your vagina. Have blood in your urine. Summary A UTI is an infection of any part of the urinary tract, which includes the kidneys, ureters, bladder,  and urethra. Most urinary tract infections are caused by bacteria in your genital area, around the entrance to your urinary tract (urethra). You are more likely to develop a UTI during pregnancy. It is important to recognize and treat UTIs in pregnancy because of the risk of serious complications for both you and your baby. If you  were prescribed an antibiotic medicine, take it as told by your health care provider. Do not stop using the antibiotic even if you start to feel better. This information is not intended to replace advice given to you by your health care provider. Make sure you discuss any questions you have with your health care provider. Document Revised: 07/05/2021 Document Reviewed: 07/05/2021 Elsevier Patient Education  2024 [employer].

## 2024-10-05 NOTE — Progress Notes (Signed)
    NURSE VISIT NOTE  Subjective:    [name], female    DOB: 09-14-1991, 33 y.o.   MRN: [id number]  HPI  Patient is a 33 y.o. G64P0101 female who presents for test of cure for UTI   Objective:    BP (!) 113/56   Pulse 65   Ht 5' (1.524 m)   Wt 153 lb 9.6 oz (69.7 kg)   LMP 06/06/2024   BMI 30.00 kg/m      Assessment:   1. UTI in pregnancy, second trimester     { Plan:   Urine Culture sent to lab. Treatment: Will await results ROV prn if symptoms persist or worsen.   Camelia Bars, LPN

## 2024-10-07 ENCOUNTER — Telehealth: Payer: Self-pay

## 2024-10-07 ENCOUNTER — Ambulatory Visit: Attending: Obstetrics and Gynecology | Admitting: Obstetrics and Gynecology

## 2024-10-07 ENCOUNTER — Other Ambulatory Visit: Payer: Self-pay | Admitting: *Deleted

## 2024-10-07 ENCOUNTER — Ambulatory Visit: Payer: Self-pay | Admitting: Maternal & Fetal Medicine

## 2024-10-07 DIAGNOSIS — O280 Abnormal hematological finding on antenatal screening of mother: Secondary | ICD-10-CM

## 2024-10-07 LAB — URINE CULTURE

## 2024-10-07 NOTE — Progress Notes (Signed)
 Virtual Visit via Video Note  I connected with [name] on 10/07/2024 at  1:00pm EDT by a video enabled telemedicine application and verified that I am speaking with the correct person using two identifiers.  Location: Patient: Work ([place]) Provider: [facility] Length of Consultation: 40 minutes  [name]  was referred to [facility] for genetic counseling to review prenatal screening and testing options due to a positive result on AFP tetra screening.  The patient was present on this virtual visit alone.  Abnormal Maternal Serum Screening: We provided background information on the maternal serum tetra screen.  It was explained that this is a maternal blood test performed between the 14th and 21st week of pregnancy which measures several pregnancy proteins.  The levels of these proteins can help determine if a pregnancy is at high risk for certain birth defects or problems.  However, it cannot diagnose or rule out these conditions.  An abnormal maternal serum screen does not necessarily mean that the baby has a problem.  Maternal serum screening can identify approximately 80% of neural tube defects, up to 75% of Down syndrome cases and 60% of trisomy 18 cases.  The neural tube consists of the fetal head and spine and if this structure fails to close during development, spina bifida (open spine) or anencephaly (open skull) could result.  Chromosomes are the inherited structures that contain our instructions for development (genes).  Each cell in our body normally has 46 chromosomes.  Rarely, when an egg and sperm unite, an extra or missing chromosome can be passed on to the baby.  These types of chromosome problems typically cause developmental delays and birth differences.  We discussed Down syndrome (an extra chromosome #21) and trisomy 34 (an extra chromosome #18).    The maternal serum screen revealed protein levels that increased the  chance of Down syndrome (Trisomy 21) in the pregnancy.  Before maternal serum screening, the age-related chance of Down syndrome in the pregnancy was 1 in 338.  Given the maternal serum screen results, the chance is now estimated to be 1 in 133.  This means that the chance the baby does not have Down sydnrome is greater than 99 percent.  Of note, the maternal serum HCG level was increased, at 3.24 MoM. Unexplained elevations of HCG have been shown to be associated with an increased risk for fetal growth restriction, placental insufficiency and poor pregnancy outcomes.  Therefore, we would recommend ultrasound for fetal growth in the third trimester.  Low risk MaterniT21:  [name] previously completed noninvasive prenatal screening (NIPS) in this pregnancy through her OB. The result is low risk for the conditions included. This screening significantly reduces but does not eliminate the chance that the current pregnancy has Down syndrome (trisomy 40), trisomy 2, and trisomy 78. The residual risk for these conditions is estimated to be 1 in 10,000 and the testing has a detection rate of >99% for Down syndrome.   We reviewed that the MaterniT21 test utilizes a maternal blood sample and DNA sequencing technology to isolate circulating cell free fetal DNA from maternal plasma.  The fetal DNA can then be analyzed for DNA sequences that are derived from the three most common chromosomes involved in aneuploidy, chromosomes 13, 18, and 21.  If the overall amount of DNA is greater than the expected level for any of these chromosomes, aneuploidy is suspected.  While we do not consider it a replacement for  invasive testing and karyotype analysis, a negative result from this testing would be reassuring, though not a guarantee of a normal chromosome complement for the baby.  An abnormal result is certainly suggestive of an abnormal chromosome complement, though we would still recommend CVS or amniocentesis to confirm any  findings from this testing.  While the negative MaterniT21 results are reassuring and this is the more accurate screening test, some families desire additional information or testing.  We therefore reviewed the following prenatal testing options for this pregnancy:  Targeted ultrasound uses high frequency sound waves to create an image of the developing fetus.  An ultrasound is often recommended as a routine means of evaluating the pregnancy.  It is also used to screen for fetal anatomy problems (for example, a heart defect) that might be suggestive of a chromosomal or other abnormality.  Approximately 50% of babies with Down syndrome would show a difference on anatomy ultrasound in the second trimester. However, not all babies with chromosome conditions will show concern on ultrasound.  Amniocentesis involves the removal of a small amount of amniotic fluid from the sac surrounding the fetus with the use of a thin needle inserted through the maternal abdomen and uterus.  Ultrasound guidance is used throughout the procedure.  Fetal cells are directly evaluated and > 98% of neural tube defects can be detected.  The main risks to this procedure include complications leading to miscarriage in less than 1 in 500 cases (0.2%).     Family history and pregnancy history: We obtained a detailed family history and pregnancy history.  This is the second pregnancy for Tina Rodgers and her husband, Tina Rodgers.  They have a healthy 62 year old daughter who was born preterm, at 13 weeks. In this pregnancy, Tina Rodgers reported no complications other than UTIs and light spotting.  She denied the use of alcohol, tobacco or recreational drugs in this pregnancy. In the family, Tina Rodgers has a paternal half first cousin with autism spectrum disorder. Autism Spectrum Disorder affects approximately 1-2% of the general population in the [place] , [place], and [place]. Autism is a neurological and developmental disorder that affects how people  interact with others, communicate, learn, and behave. Autism is known as a spectrum disorder because there is wide variation in the type and severity of symptoms people experience. Genetic testing for individuals with a clinical diagnosis of autism yields an explanation in only about 20% of cases, and the remaining 80% of cases are left with unknown etiology. In the absence of a known genetic cause in the affected family member, we are unable to test directly for autism in pregnancy. We did review the option of carrier testing for Fragile X syndrome, the most common inherited cause for intellectual delays which can have autism as a feature.  However, given the relationship of this cousin to Jaida (paternal grandfathers daughters son), testing for Fragile X would not be helpful. The remainder of the family history was reported to be unremarkable for birth defects, intellectual delays, recurrent pregnancy loss or known chromosome abnormalities.  Carrier screening: Per the ACOG Committee Opinion 691, all women who are considering a pregnancy or are currently pregnant should be offered carrier screening for, at minimum, Cystic Fibrosis (CF), Spinal Muscular Atrophy (SMA), and Hemoglobinopathies The mode of inheritance, clinical manifestations of these conditions, as well as details about testing were reviewed. A negative result on carrier screening reduces the likelihood of being a carrier, however, does not entirely rule out the possibility. If Caterine was found  to be a carrier for a specific condition, carrier screening for their reproductive partner would be recommended.  Review of records showed that Diamond had hemoglobin fractionation, which was normal (AA) for beta hemoglobinopathies (including sickle cell disease).  If she desires additional testing, then screening for at a minimum CF, SMA and alpha thalassemia would be available.  Plan of Care: Reatha declines amniocentesis for chromosome conditions  due to the risk of the procedure and the fact that this result would not change the course of the pregnancy for she and her husband. We will request the [facility] clinic to reach out to schedule a detailed anatomy ultrasound, though given the schedule availability it may be a few weeks out.  Adaline is already scheduled for her routine anatomy ultrasound at [facility] on 11/18 and may speak with her provider following about having a detailed u/s with [facility].  Fetal echocardiogram could be considered after [redacted] weeks gestation if the fetal heart anatomy is not well seen or is concerning, as many babies with Down syndrome have structural heart defects. Due to the elevated maternal serum HCG, we recommend follow up ultrasound for fetal growth after 28 weeks. Chara plans to speak with her partner about carrier screening and reach out to us  or her OB if desired.  Ms. Lassalle was encouraged to call with questions or concerns.  We can be contacted at [phone].  65 minutes were spent on the date of the encounter in service to the patient including preparation, virtual video consultation, discussion of test reports and available next steps, pedigree construction, genetic risk assessment, documentation, and care coordination.    Thank you for sharing in the care of Allessandra with us .  Please do not hesitate to contact us  at [phone] if you have any questions.  Barnie PHEBE Dixons, MS, CGC

## 2024-10-08 ENCOUNTER — Ambulatory Visit: Payer: Self-pay

## 2024-10-13 ENCOUNTER — Other Ambulatory Visit

## 2024-10-20 ENCOUNTER — Ambulatory Visit

## 2024-10-20 ENCOUNTER — Ambulatory Visit (INDEPENDENT_AMBULATORY_CARE_PROVIDER_SITE_OTHER): Admitting: Certified Nurse Midwife

## 2024-10-20 ENCOUNTER — Encounter: Payer: Self-pay | Admitting: Certified Nurse Midwife

## 2024-10-20 VITALS — BP 123/87 | HR 96 | Wt 156.8 lb

## 2024-10-20 DIAGNOSIS — Z3482 Encounter for supervision of other normal pregnancy, second trimester: Secondary | ICD-10-CM | POA: Diagnosis not present

## 2024-10-20 DIAGNOSIS — Z3480 Encounter for supervision of other normal pregnancy, unspecified trimester: Secondary | ICD-10-CM

## 2024-10-20 DIAGNOSIS — R772 Abnormality of alphafetoprotein: Secondary | ICD-10-CM | POA: Diagnosis not present

## 2024-10-20 DIAGNOSIS — Z3A19 19 weeks gestation of pregnancy: Secondary | ICD-10-CM | POA: Diagnosis not present

## 2024-10-20 DIAGNOSIS — Z369 Encounter for antenatal screening, unspecified: Secondary | ICD-10-CM

## 2024-10-20 MED ORDER — HYDROCORTISONE 1 % EX OINT: 1.0000 | TOPICAL_OINTMENT | Freq: Two times a day (BID) | CUTANEOUS | 0 refills | Status: AC

## 2024-10-20 NOTE — Assessment & Plan Note (Addendum)
 Met with genetic counselor and reviewed increased risk of Trisomy 63 and 21 although this risk is still small. Tina Rodgers and her husband have declined an amniocentesis. She had an anatomy US  today at [facility]. Has target US  scheduled at [facility] but she is considering cancelling if the US  looks normal due to potential costs. The counselor also noted that she had elevated HCG (3.24MOM) and wrote: Unexplained elevations of HCG have been shown to be associated with an increased risk for fetal growth restriction, placental insufficiency and poor pregnancy outcomes. Therefore, we would recommend ultrasound for fetal growth in the third trimester. Will place order for third trimester US .

## 2024-10-20 NOTE — Patient Instructions (Signed)
 Second Trimester of Pregnancy  The second trimester of pregnancy is from week 14 through week 27. This is months 4 through 6 of pregnancy. During the second trimester: Morning sickness is less or has stopped. You may have more energy. You may feel hungry more often. At this time, your unborn baby is growing very fast. At the end of the sixth month, the unborn baby may be up to 12 inches long and weigh about 1 pounds. You will likely start to feel the baby move between 16 and 20 weeks of pregnancy. Body changes during your second trimester Your body continues to change during this time. The changes usually go away after your baby is born. Physical changes You will gain more weight. Your belly will get bigger. You may begin to get stretch marks on your hips, belly, and breasts. Your breasts will keep growing and may hurt. You may get dark spots or blotches on your face. A dark line from your belly button to the pubic area may appear. This line is called linea nigra. Your hair may grow faster and get thicker. Health changes You may have headaches. You may have heartburn. You may pee more often. You may have swollen, bulging veins (varicose veins). You may have trouble pooping (constipation), or swollen veins in the butt that can itch or get painful (hemorrhoids). You may have back pain. This is caused by: Weight gain. Pregnancy hormones that are relaxing the joints in your pelvis. Follow these instructions at home: Medicines Talk to your health care provider if you're taking medicines. Ask if the medicines are safe to take during pregnancy. Your provider may change the medicines that you take. Do not take any medicines unless told to by your provider. Take a prenatal vitamin that has at least 600 micrograms (mcg) of folic acid. Do not use herbal medicines, illegal drugs, or medicines that are not approved by your provider. Eating and drinking While you're pregnant your body needs  extra food for your growing baby. Talk with your provider about what to eat while pregnant. Activity Most women are able to exercise during pregnancy. Exercises may need to change as your pregnancy goes on. Talk to your provider about your activities and exercise routines. Relieving pain and discomfort Wear a good, supportive bra if your breasts hurt. Rest with your legs raised if you have leg cramps or low back pain. Take warm sitz baths to soothe pain from hemorrhoids. Use hemorrhoid cream if your provider says it's okay. Do not douche. Do not use tampons or scented pads. Do not use hot tubs, steam rooms, or saunas. Safety Wear your seatbelt at all times when you're in a car. Talk to your provider if someone hits you, hurts you, or yells at you. Talk with your provider if you're feeling sad or have thoughts of hurting yourself. Lifestyle Certain things can be harmful while you're pregnant. It's best to avoid the following: Do not drink alcohol,smoke, vape, or use products with nicotine or tobacco in them. If you need help quitting, talk with your provider. Avoid cat litter boxes and soil used by cats. These things carry germs that can cause harm to your pregnancy and your baby. General instructions Keep all follow-up visits. It helps you and your unborn baby stay as healthy as possible. Write down your questions. Take them to your prenatal visits. Your provider will: Talk with you about your overall health. Give you advice or refer you to specialists who can help with different needs,  including: Prenatal education classes. Mental health and counseling. Foods and healthy eating. Ask for help if you need help with food. Where to find more information American Pregnancy Association: americanpregnancy.org [employer]necologists: acog.org Office on [employer] Health: [employer] Contact a health care provider if: You have a headache that does not go away  when you take medicine. You have any of these problems: You can't eat or drink. You throw up or feel like you may throw up. You have watery poop (diarrhea) for 2 days or more. You have pain when you pee or your pee smells bad. You have been sick for 2 days or more and are not getting better. Contact your provider right away if: You have any of these coming from your vagina: Abnormal discharge. Bad-smelling fluid. Bleeding. Your baby is moving less than usual. You have contractions, belly cramping, or have pain in your pelvis or lower back. You have symptoms of high blood pressure or preeclampsia. These include: A severe, throbbing headache that does not go away. Sudden or extreme swelling of your face, hands, legs, or feet. Vision problems: You see spots. You have blurry vision. Your eyes are sensitive to light. If you can't reach the provider, go to an urgent care or emergency room. Get help right away if: You faint, become confused, or can't think clearly. You have chest pain or trouble breathing. You have any kind of injury, such as from a fall or a car crash. These symptoms may be an emergency. Call 911 right away. Do not wait to see if the symptoms will go away. Do not drive yourself to the hospital. This information is not intended to replace advice given to you by your health care provider. Make sure you discuss any questions you have with your health care provider. Document Revised: 08/22/2023 Document Reviewed: 03/22/2023 Elsevier Patient Education  2024 [employer].

## 2024-10-20 NOTE — Assessment & Plan Note (Signed)
 Eczema on hands and fingers. Reviewed emollient cream in cold weather. Rx for hydrocortisone cream provided.  Recommended adding magnesium  500-1000mg  daily for nerve discomfort and leg cramps Reviewed red flag warning signs anticipatory guidance for upcoming prenatal care.

## 2024-10-20 NOTE — Progress Notes (Signed)
    Return Prenatal Note   Subjective   33 y.o. G2P0101 at [redacted]w[redacted]d presents for this follow-up prenatal visit.  Patient is doing well. She reports numbness in left leg and she also has a skin rash, that is red and raised and she thinks that it may be eczema. She also has concerns about getting charlie horses on and off usually at night. Patient reports: Movement: Increased Contractions: Not present  Objective   Flow sheet Vitals: Pulse Rate: 96 BP: 123/87 Fetal Heart Rate (bpm): 145 Total weight gain: 10 lb 12.8 oz (4.899 kg)  General Appearance  No acute distress, well appearing, and well nourished Pulmonary   Normal work of breathing Neurologic   Alert and oriented to person, place, and time Psychiatric   Mood and affect within normal limits   Assessment/Plan   Plan  33 y.o. G2P0101 at [redacted]w[redacted]d presents for follow-up OB visit. Reviewed prenatal record including previous visit note.  Elevated AFP Met with genetic counselor and reviewed increased risk of Trisomy 44 and 21 although this risk is still small. Tina Rodgers and her husband have declined an amniocentesis. She had an anatomy US  today at [facility]. Has target US  scheduled at [facility] but she is considering cancelling if the US  looks normal due to potential costs. The counselor also noted that she had elevated HCG (3.24MOM) and wrote: Unexplained elevations of HCG have been shown to be associated with an increased risk for fetal growth restriction, placental insufficiency and poor pregnancy outcomes. Therefore, we would recommend ultrasound for fetal growth in the third trimester. Will place order for third trimester US .   Supervision of other normal pregnancy, antepartum Eczema on hands and fingers. Reviewed emollient cream in cold weather. Rx for hydrocortisone cream provided.  Recommended adding magnesium  500-1000mg  daily for nerve discomfort and leg cramps Reviewed red flag warning signs anticipatory guidance for upcoming prenatal  care.          Future Appointments  Date Time Provider Department Center  11/17/2024  3:35 PM Tina Rodgers, Tina Rodgers, CNM [facility] None  11/18/2024  7:45 AM [facility] CONSULT RM [facility] None  11/18/2024  8:00 AM [facility] US1 [facility]  12/23/2024  3:45 PM [facility] CONSULT RM [facility] None  12/23/2024  4:00 PM [facility] US1 [facility]  02/02/2025 10:00 AM Tina Rodgers, Tina Rodgers BROCKS, MD [facility] None  06/10/2025 10:40 AM Tina Rodgers, Tina Rodgers POUR, PA-C [facility] None    For next visit:  continue with routine prenatal care

## 2024-11-03 ENCOUNTER — Telehealth (INDEPENDENT_AMBULATORY_CARE_PROVIDER_SITE_OTHER): Admitting: Nurse Practitioner

## 2024-11-03 ENCOUNTER — Encounter: Payer: Self-pay | Admitting: Nurse Practitioner

## 2024-11-03 VITALS — Resp 16 | Ht 60.0 in | Wt 160.0 lb

## 2024-11-03 DIAGNOSIS — J0100 Acute maxillary sinusitis, unspecified: Secondary | ICD-10-CM

## 2024-11-03 MED ORDER — AMOXICILLIN 875 MG PO TABS: 875.0000 mg | ORAL_TABLET | Freq: Two times a day (BID) | ORAL | 0 refills | Status: AC

## 2024-11-03 NOTE — Progress Notes (Signed)
 [facility] [address] [place], [place] [address]  Internal MEDICINE  Telephone Visit  Patient Name: Tina Rodgers  [id number]  [id number]  Date of Service: 11/03/2024  I connected with the patient at 1150 by telephone and verified the patients identity using two identifiers.   I discussed the limitations, risks, security and privacy concerns of performing an evaluation and management service by telephone and the availability of in person appointments. I also discussed with the patient that there may be a patient responsible charge related to the service.  The patient expressed understanding and agrees to proceed.    Chief Complaint  Patient presents with   Telephone Screen    [redacted] wks pregnant with sinus infection   Telephone Assessment    HPI Tina Rodgers presents for a telehealth virtual visit for sinus infection Symptoms started She is [redacted] weeks pregnant. She called her OBGYN and was told that they do not see patients for sick visits and she will need to call her PCP.  She reports sinus pressure/pain, nasal congestion, runny nose, sinus drainage, cough, sore throat, headache.     Current Medication: Outpatient Encounter Medications as of 11/03/2024  Medication Sig   amoxicillin  (AMOXIL ) 875 MG tablet Take 1 tablet (875 mg total) by mouth 2 (two) times daily for 7 days. May take with food   fluticasone  (FLONASE ) 50 MCG/ACT nasal spray SPRAY 2 SPRAYS INTO EACH NOSTRIL EVERY DAY   hydrocortisone  1 % ointment Apply 1 Application topically 2 (two) times daily.   NON FORMULARY Vaginal probiotic tablet ( one tablet daily)   Prenatal Vit-Fe Fumarate-FA (MULTIVITAMIN-PRENATAL) 27-0.8 MG TABS tablet Take 1 tablet by mouth daily at 12 noon.   sertraline  (ZOLOFT ) 50 MG tablet TAKE 1 TABLET BY MOUTH EVERY DAY   No facility-administered encounter medications on file as of 11/03/2024.    Surgical History: Past Surgical History:  Procedure Laterality Date   BREAST  SURGERY     2 lumps removed- neg   COLONOSCOPY WITH PROPOFOL  N/A 04/25/2022   Procedure: COLONOSCOPY WITH PROPOFOL ;  Surgeon: Unk Corinn Skiff, MD;  Location: [facility] ENDOSCOPY;  Service: Gastroenterology;  Laterality: N/A;    Medical History: Past Medical History:  Diagnosis Date   ADD (attention deficit disorder)    Vitamin D deficiency     Family History: Family History  Problem Relation Age of Onset   Ovarian cancer Paternal Grandmother    Atrial fibrillation Father    Breast cancer Neg Hx    Colon cancer Neg Hx    Diabetes Neg Hx     Social History   Socioeconomic History   Marital status: Married    Spouse name: Tina Rodgers   Number of children: 1   Years of education: Not on file   Highest education level: Bachelor's degree (e.g., BA, AB, BS)  Occupational History   Occupation: Psychologist, Occupational  Tobacco Use   Smoking status: Never   Smokeless tobacco: Never  Vaping Use   Vaping status: Never Used  Substance and Sexual Activity   Alcohol use: Not Currently    Comment: ocassionally   Drug use: No   Sexual activity: Yes  Other Topics Concern   Not on file  Social History Narrative   Not on file   Social Drivers of Health   Financial Resource Strain: Low Risk  (07/27/2024)   Overall Financial Resource Strain (CARDIA)    Difficulty of Paying Living Expenses: Not very hard  Food Insecurity: No Food Insecurity (07/27/2024)  Hunger Vital Sign    Worried About Running Out of Food in the Last Year: Never true    Ran Out of Food in the Last Year: Never true  Transportation Needs: No Transportation Needs (07/27/2024)   PRAPARE - Administrator, Civil Service (Medical): No    Lack of Transportation (Non-Medical): No  Physical Activity: Insufficiently Active (07/27/2024)   Exercise Vital Sign    Days of Exercise per Week: 3 days    Minutes of Exercise per Session: 30 min  Stress: No Stress Concern Present (07/27/2024)   [employer] of Occupational Health  - Occupational Stress Questionnaire    Feeling of Stress: Only a little  Social Connections: Socially Integrated (07/27/2024)   Social Connection and Isolation Panel    Frequency of Communication with Friends and Family: Twice a week    Frequency of Social Gatherings with Friends and Family: Once a week    Attends Religious Services: More than 4 times per year    Active Member of [employer] or Organizations: Yes    Attends Banker Meetings: 1 to 4 times per year    Marital Status: Married  Catering Manager Violence: Not At Risk (07/27/2024)   Humiliation, Afraid, Rape, and Kick questionnaire    Fear of Current or Ex-Partner: No    Emotionally Abused: No    Physically Abused: No    Sexually Abused: No      Review of Systems  Constitutional:  Positive for appetite change and fatigue. Negative for chills and fever.  HENT:  Positive for congestion, postnasal drip, rhinorrhea, sinus pressure, sinus pain, sore throat and trouble swallowing. Negative for ear pain and mouth sores.   Respiratory:  Positive for cough and chest tightness. Negative for shortness of breath and wheezing.   Cardiovascular: Negative.  Negative for chest pain and palpitations.  Genitourinary:  Negative for flank pain.  Neurological:  Positive for headaches.  Psychiatric/Behavioral: Negative.      Vital Signs: Resp 16   Ht 5' (1.524 m)   Wt 160 lb (72.6 kg)   LMP 06/06/2024   BMI 31.25 kg/m    Observation/Objective: She is alert and oriented. No acute distress noted.     Assessment/Plan: 1. Acute non-recurrent maxillary sinusitis (Primary) Take amoxicillin  as prescribed until gone  - amoxicillin  (AMOXIL ) 875 MG tablet; Take 1 tablet (875 mg total) by mouth 2 (two) times daily for 7 days. May take with food  Dispense: 14 tablet; Refill: 0   General Counseling: Tina Rodgers understanding of the findings of today's phone visit and agrees with plan of treatment. I have discussed any further  diagnostic evaluation that may be needed or ordered today. We also reviewed her medications today. she has been encouraged to call the office with any questions or concerns that should arise related to todays visit.  Return if symptoms worsen or fail to improve, for follow up with OBGYN.   No orders of the defined types were placed in this encounter.   Meds ordered this encounter  Medications   amoxicillin  (AMOXIL ) 875 MG tablet    Sig: Take 1 tablet (875 mg total) by mouth 2 (two) times daily for 7 days. May take with food    Dispense:  14 tablet    Refill:  0    Fill new script today    Time spent:10 Minutes Time spent with patient included reviewing progress notes, labs, imaging studies, and discussing plan for follow up.  [place] Controlled Substance  Database was reviewed by me for overdose risk score (ORS) if appropriate.  This patient was seen by Mardy Maxin, FNP-C in collaboration with Dr. Sigrid Bathe as a part of collaborative care agreement.  Jaedan Schuman R. Maxin, MSN, FNP-C Internal medicine

## 2024-11-17 ENCOUNTER — Ambulatory Visit: Admitting: Certified Nurse Midwife

## 2024-11-17 ENCOUNTER — Encounter: Payer: Self-pay | Admitting: Certified Nurse Midwife

## 2024-11-17 VITALS — BP 111/54 | HR 96 | Wt 163.5 lb

## 2024-11-17 DIAGNOSIS — R300 Dysuria: Secondary | ICD-10-CM

## 2024-11-17 DIAGNOSIS — O09899 Supervision of other high risk pregnancies, unspecified trimester: Secondary | ICD-10-CM

## 2024-11-17 DIAGNOSIS — Z3480 Encounter for supervision of other normal pregnancy, unspecified trimester: Secondary | ICD-10-CM

## 2024-11-17 LAB — POCT URINALYSIS DIPSTICK OB
Bilirubin, UA: NEGATIVE
Blood, UA: NEGATIVE
Glucose, UA: NEGATIVE
Ketones, UA: NEGATIVE
Leukocytes, UA: NEGATIVE
Nitrite, UA: NEGATIVE
POC,PROTEIN,UA: NEGATIVE
Spec Grav, UA: 1.01 (ref 1.010–1.025)
Urobilinogen, UA: 0.2 U/dL
pH, UA: 6 (ref 5.0–8.0)

## 2024-11-17 NOTE — Assessment & Plan Note (Signed)
 Reviewed history. Plan to check cervix in third trimester and consider FFN.

## 2024-11-17 NOTE — Progress Notes (Signed)
° ° °  Return Prenatal Note   Subjective   33 y.o. G2P0101 at [redacted]w[redacted]d presents for this follow-up prenatal visit.  Patient is doing well. She has some concerns about baby being so low.  She has some dysuria sometimes and her urine is cloudy sometimes. Patient reports: Movement: Present Contractions: Not present  Objective   Flow sheet Vitals: Pulse Rate: 96 BP: (!) 111/54 Fundal Height: 24 cm Fetal Heart Rate (bpm): 153 Total weight gain: 17 lb 8 oz (7.938 kg)  General Appearance  No acute distress, well appearing, and well nourished Pulmonary   Normal work of breathing Neurologic   Alert and oriented to person, place, and time Psychiatric   Mood and affect within normal limits   Assessment/Plan   Plan  33 y.o. G2P0101 at [redacted]w[redacted]d presents for follow-up OB visit. Reviewed prenatal record including previous visit note.  Hx of preterm delivery, currently pregnant Reviewed history. Plan to check cervix in third trimester and consider FFN.  Supervision of other normal pregnancy, antepartum Will send urine culture for dysuria.  Reviewed red flag warning signs anticipatory guidance for upcoming prenatal care.        Orders Placed This Encounter  Procedures   Culture, OB Urine   POC Urinalysis Dipstick OB   No follow-ups on file.   Future Appointments  Date Time Provider Department Center  12/23/2024  3:45 PM [facility] CONS[facility] [place]  12/23/2024  4:00 PM [facility] US  1 [facility] [place]  02/02/2025 10:00 AM Stoioff, Glendia BROCKS, MD [facility] None  06/10/2025 10:40 AM McDonough, [name], PA-C [facility] None    For next visit:  ROB with 1 hour glucola, third trimester labs, and Tdap     Damien PARSLEY, CNM  12/16/20254:01 PM

## 2024-11-17 NOTE — Assessment & Plan Note (Signed)
 Will send urine culture for dysuria.  Reviewed red flag warning signs anticipatory guidance for upcoming prenatal care.

## 2024-11-18 ENCOUNTER — Ambulatory Visit

## 2024-12-14 NOTE — Patient Instructions (Incomplete)
 Third Trimester of Pregnancy  The third trimester of pregnancy is from week 28 through week 40. This is months 7 through 9. The third trimester is a time when your baby is growing fast. Body changes during your third trimester Your body continues to change during this time. The changes usually go away after your baby is born. Physical changes You will continue to gain weight. You may get stretch marks on your hips, belly, and breasts. Your breasts will keep growing and may hurt. A yellow fluid (colostrum) may leak from your breasts. This is the first milk you're making for your baby. Your hair may grow faster and get thicker. In some cases, you may get hair loss. Your belly button may stick out. You may have more swelling in your hands, face, or ankles. Health changes You may have heartburn. You may feel short of breath. This is caused by the uterus that is now bigger. You may have more aches in the pelvis, back, or thighs. You may have more tingling or numbness in your hands, arms, and legs. You may pee more often. You may have trouble pooping (constipation) or swollen veins in the butt that can itch or get painful (hemorrhoids). Other changes You may have more problems sleeping. You may notice the baby moving lower in your belly (dropping). You may have more fluid coming from your vagina. Your joints may feel loose, and you may have pain around your pelvic bone. Follow these instructions at home: Medicines Take medicines only as told by your health care provider. Some medicines are not safe during pregnancy. Your provider may change the medicines that you take. Do not take any medicines unless told to by your provider. Take a prenatal vitamin that has at least 600 micrograms (mcg) of folic acid. Do not use herbal medicines, illegal drugs, or medicines that are not approved by your provider. Eating and drinking While you're pregnant your body needs additional nutrition to help  support your growing baby. Talk with your provider about your nutritional needs. Activity Most women are able to exercise regularly during pregnancy. Exercise routines may need to change at the end of your pregnancy. Talk to your provider about your activities and exercise routine. Relieving pain and discomfort Rest often with your legs raised if you have leg cramps or low back pain. Take warm sitz baths to soothe pain from hemorrhoids. Use hemorrhoid cream if your provider says it's okay. Wear a good, supportive bra if your breasts hurt. Do not use hot tubs, steam rooms, or saunas. Do not douche. Do not use tampons or scented pads. Safety Talk to your provider before traveling far distances. Wear your seatbelt at all times when you're in a car. Talk to your provider if someone hits you, hurts you, or yells at you. Preparing for birth To prepare for your baby: Take childbirth and breastfeeding classes. Visit the hospital and tour the maternity area. Buy a rear-facing car seat. Learn how to install it in your car. General instructions Avoid cat litter boxes and soil used by cats. These things carry germs that can cause harm to your pregnancy and your baby. Do not drink alcohol, smoke, vape, or use products with nicotine or tobacco in them. If you need help quitting, talk with your provider. Keep all follow-up visits for your third trimester. Your provider will do more exams and tests during this trimester. Write down your questions. Take them to your prenatal visits. Your provider also will: Talk with you about  your overall health. Give you advice or refer you to specialists who can help with different needs, including: Mental health and counseling. Foods and healthy eating. Ask for help if you need help with food. Where to find more information American Pregnancy Association: americanpregnancy.org [employer] of Obstetricians and Gynecologists: acog.org Office on [employer] Health:  [employer] Contact a health care provider if: You have a headache that does not go away when you take medicine. You have any of these problems: You can't eat or drink. You have nausea and vomiting. You have watery poop (diarrhea) for 2 days or more. You have pain when you pee, or your pee smells bad. You have been sick for 2 days or more and aren't getting better. Contact your provider right away if: You have any of these coming from your vagina: Abnormal discharge. Bad-smelling fluid. Bleeding. Your baby is moving less than usual. You have signs of labor: You have any contractions, belly cramping, or have pain in your pelvis or lower back before 37 weeks of pregnancy (preterm labor). You have regular contractions that are less than 5 minutes apart. Your water breaks. You have symptoms of high blood pressure or preeclampsia. These include: A severe, throbbing headache that does not go away. Sudden or extreme swelling of your face, hands, legs, or feet. Vision problems: You see spots. You have blurry vision. Your eyes are sensitive to light. If you can't reach your provider, go to an urgent care or emergency room. Get help right away if: You faint, become confused, or can't think clearly. You have chest pain or trouble breathing. You have any kind of injury, such as from a fall or a car crash. These symptoms may be an emergency. Call 911 right away. Do not wait to see if the symptoms will go away. Do not drive yourself to the hospital. This information is not intended to replace advice given to you by your health care provider. Make sure you discuss any questions you have with your health care provider. Document Revised: 08/22/2023 Document Reviewed: 03/22/2023 Elsevier Patient Education  2024 [employer].

## 2024-12-15 ENCOUNTER — Other Ambulatory Visit

## 2024-12-15 ENCOUNTER — Ambulatory Visit: Admitting: Advanced Practice Midwife

## 2024-12-15 ENCOUNTER — Encounter: Payer: Self-pay | Admitting: Advanced Practice Midwife

## 2024-12-15 VITALS — BP 106/64 | HR 97 | Wt 166.3 lb

## 2024-12-15 DIAGNOSIS — Z3A27 27 weeks gestation of pregnancy: Secondary | ICD-10-CM

## 2024-12-15 DIAGNOSIS — O2342 Unspecified infection of urinary tract in pregnancy, second trimester: Secondary | ICD-10-CM

## 2024-12-15 DIAGNOSIS — Z1332 Encounter for screening for maternal depression: Secondary | ICD-10-CM

## 2024-12-15 DIAGNOSIS — Z113 Encounter for screening for infections with a predominantly sexual mode of transmission: Secondary | ICD-10-CM

## 2024-12-15 DIAGNOSIS — O26899 Other specified pregnancy related conditions, unspecified trimester: Secondary | ICD-10-CM

## 2024-12-15 DIAGNOSIS — Z3482 Encounter for supervision of other normal pregnancy, second trimester: Secondary | ICD-10-CM

## 2024-12-15 DIAGNOSIS — O360120 Maternal care for anti-D [Rh] antibodies, second trimester, not applicable or unspecified: Secondary | ICD-10-CM

## 2024-12-15 DIAGNOSIS — Z130 Encounter for screening for diseases of the blood and blood-forming organs and certain disorders involving the immune mechanism: Secondary | ICD-10-CM

## 2024-12-15 DIAGNOSIS — Z3480 Encounter for supervision of other normal pregnancy, unspecified trimester: Secondary | ICD-10-CM

## 2024-12-15 DIAGNOSIS — Z114 Encounter for screening for human immunodeficiency virus [HIV]: Secondary | ICD-10-CM

## 2024-12-15 DIAGNOSIS — Z131 Encounter for screening for diabetes mellitus: Secondary | ICD-10-CM

## 2024-12-15 MED ORDER — RHO D IMMUNE GLOBULIN 1500 UNIT/2ML IJ SOSY
300.0000 ug | PREFILLED_SYRINGE | Freq: Once | INTRAMUSCULAR | Status: AC
  Administered 2024-12-15: 300 ug via INTRAMUSCULAR

## 2024-12-15 NOTE — Progress Notes (Signed)
 "   Routine Prenatal Care Visit  Subjective  Tina Rodgers is a 34 y.o. G2P0101 at [redacted]w[redacted]d being seen today for ongoing prenatal care.  She is currently monitored for the following issues for this low-risk pregnancy and has Attention deficit hyperactivity disorder (ADHD), predominantly hyperactive type; Psoriasis; Generalized anxiety disorder; Atopic dermatitis; Supervision of other normal pregnancy, antepartum; Hx of preterm delivery, currently pregnant; Vaginal bleeding affecting early pregnancy; UTI (urinary tract infection) during pregnancy; and Elevated AFP on their problem list.  ----------------------------------------------------------------------------------- Patient reports pelvic pressure occasionally, especially after IC. She has numbness and sciatica pain in her left leg. Advised try abdominal support band and reviewed other comfort measures including chiropractic/massage.  28 week labs today. Contractions: Not present. Vag. Bleeding: None.  Movement: Present. Leaking Fluid denies.  ----------------------------------------------------------------------------------- The following portions of the patient's history were reviewed and updated as appropriate: allergies, current medications, past family history, past medical history, past social history, past surgical history and problem list. Problem list updated.  Objective  BP 106/64   Pulse 97   Wt 166 lb 4.8 oz (75.4 kg)   LMP 06/06/2024   BMI 32.48 kg/m  Pregravid weight 146 lb (66.2 kg) Total Weight Gain 20 lb 4.8 oz (9.208 kg) Urinalysis: Urine Protein    Urine Glucose    Fetal Status: Fetal Heart Rate (bpm): 137 Fundal Height: 28 cm Movement: Present      General:  Alert, oriented and cooperative. Patient is in no acute distress.  Skin: Skin is warm and dry. No rash noted.   Cardiovascular: Normal heart rate noted  Respiratory: Normal respiratory effort, no problems with respiration noted  Abdomen: Soft, gravid,  appropriate for gestational age. Pain/Pressure: Absent     Pelvic:  Cervical exam deferred        Extremities: Normal range of motion.  Edema: None  Mental Status: Normal mood and affect. Normal behavior. Normal judgment and thought content.    Edinburgh Postnatal Depression Scale - 12/15/24 1011       Edinburgh Postnatal Depression Scale:  In the Past 7 Days   I have been able to laugh and see the funny side of things. 0    I have looked forward with enjoyment to things. 0    I have blamed myself unnecessarily when things went wrong. 1    I have been anxious or worried for no good reason. 1    I have felt scared or panicky for no good reason. 1    Things have been getting on top of me. 0    I have been so unhappy that I have had difficulty sleeping. 0    I have felt sad or miserable. 1    I have been so unhappy that I have been crying. 0    The thought of harming myself has occurred to me. 0    Edinburgh Postnatal Depression Scale Total 4          Assessment   34 y.o. G2P0101 at [redacted]w[redacted]d by  03/13/2025, by Last Menstrual Period presenting for routine prenatal visit  Plan   G2 Problems (from 07/27/24 to present)     Problem Noted Diagnosed Resolved   UTI (urinary tract infection) during pregnancy 09/29/2024 by Jayne Harlene CROME, CNM  No   Overview Signed 09/29/2024  4:30 PM by Jayne Harlene CROME, CNM  E. Faecalis, 09/17/24 TOC [ ]       Hx of preterm delivery, currently pregnant 09/02/2024 by Delinda Jinnie Jansky, CNM  No   Overview Signed 11/17/2024  3:50 PM by Macy Perkins, CNM  Was checked in office at 36 weeks and was found to be 3cm. Rechecked a few minutes later and was then 5cm. She was not feeling contractions. Sent to hospital and was 7cm when she got there.       Supervision of other normal pregnancy, antepartum 07/27/2024 by Tamea Annalee DEL, CMA  No   Overview Addendum 12/15/2024 10:09 AM by Taft Salines, LPN   Clinical Staff Provider  Office  Location   Ob/Gyn Dating  03/13/2025, Date entered prior to episode creation  Language  English Anatomy US   WNL; Posterior placenta  Flu Vaccine  09/22/24 Genetic Screen  NIPS: Neg;female  TDaP vaccine  Defer to 30 wk d/t illness Hgb A1C or  GTT Early : Not done Third trimester :   Covid Has had   LAB RESULTS   Rhogam   09/03/24;12/15/24 Blood Type  A neg  RSV NA Antibody  Negative  Feeding Plan Pump & feed Rubella  Immune  Contraception pills RPR  Non Reactive  Circumcision Yes  HBsAg  Negative  Pediatrician  [place] [facility] HIV  Non Reactive  Support Person Husband Varicella  Immune  Prenatal Classes maybe GBS  (For PCN allergy, check sensitivities)     Hep C     BTL Consent NA Pap Diagnosis  Date Value Ref Range Status  05/03/2022   Final   - Negative for intraepithelial lesion or malignancy (NILM)    VBAC Consent NA Hgb Electro  Negative/Normal    CF      SMA                    Preterm labor symptoms and general obstetric precautions including but not limited to vaginal bleeding, contractions, leaking of fluid and fetal movement were reviewed in detail with the patient. Please refer to After Visit Summary for other counseling recommendations.   Return in about 2 weeks (around 12/29/2024) for rob.  Slater Rains, CNM 12/15/2024 10:51 AM    "

## 2024-12-15 NOTE — Addendum Note (Signed)
 Addended by: TAFT CAMELIA MATSU on: 12/15/2024 11:09 AM   Modules accepted: Orders

## 2024-12-16 LAB — 28 WEEKS RH-PANEL
Antibody Screen: NEGATIVE
Basophils Absolute: 0.1 x10E3/uL (ref 0.0–0.2)
Basos: 1 %
EOS (ABSOLUTE): 0.1 x10E3/uL (ref 0.0–0.4)
Eos: 1 %
Gestational Diabetes Screen: 105 mg/dL (ref 70–139)
HIV Screen 4th Generation wRfx: NONREACTIVE
Hematocrit: 34.6 % (ref 34.0–46.6)
Hemoglobin: 11.5 g/dL (ref 11.1–15.9)
Immature Grans (Abs): 0.6 x10E3/uL — ABNORMAL HIGH (ref 0.0–0.1)
Immature Granulocytes: 4 %
Lymphocytes Absolute: 1.8 x10E3/uL (ref 0.7–3.1)
Lymphs: 14 %
MCH: 29.7 pg (ref 26.6–33.0)
MCHC: 33.2 g/dL (ref 31.5–35.7)
MCV: 89 fL (ref 79–97)
Monocytes Absolute: 0.6 x10E3/uL (ref 0.1–0.9)
Monocytes: 5 %
Neutrophils Absolute: 9.6 x10E3/uL — ABNORMAL HIGH (ref 1.4–7.0)
Neutrophils: 75 %
Platelets: 258 x10E3/uL (ref 150–450)
RBC: 3.87 x10E6/uL (ref 3.77–5.28)
RDW: 13.3 % (ref 11.7–15.4)
RPR Ser Ql: NONREACTIVE
WBC: 12.7 x10E3/uL — ABNORMAL HIGH (ref 3.4–10.8)

## 2024-12-17 ENCOUNTER — Ambulatory Visit: Payer: Self-pay | Admitting: Certified Nurse Midwife

## 2024-12-17 NOTE — Progress Notes (Unsigned)
" ° ° °  NURSE VISIT NOTE  Subjective:    [name], female    DOB: 08-12-1991, 34 y.o.   MRN: [id number]       HPI  Patient is a 34 y.o. G84P0101 female who presents for {UTI Symptoms:210800002} for {0-10:33138} {TIME; UNITS DAY/WEEK/MONTH:19136}.  Patient denies {UTI Symptoms:210800002}.  Patient {does/does not:33181} have a history of recurrent UTI.  Patient {does/does not:33181} have a history of pyelonephritis.    Objective:    LMP 06/06/2024    Lab Review  No results found for any visits on 12/18/24.  Assessment:   1. Vaginal discharge      Plan:   {AOB UTI PLAN:28528:p}   Atari Novick LITTIE Getting, CMA       "

## 2024-12-18 ENCOUNTER — Ambulatory Visit

## 2024-12-18 ENCOUNTER — Other Ambulatory Visit (HOSPITAL_COMMUNITY)
Admission: RE | Admit: 2024-12-18 | Discharge: 2024-12-18 | Disposition: A | Source: Ambulatory Visit | Attending: Certified Nurse Midwife | Admitting: Certified Nurse Midwife

## 2024-12-18 VITALS — BP 118/63 | HR 98 | Ht 60.0 in | Wt 169.3 lb

## 2024-12-18 DIAGNOSIS — N898 Other specified noninflammatory disorders of vagina: Secondary | ICD-10-CM | POA: Diagnosis not present

## 2024-12-18 DIAGNOSIS — R3915 Urgency of urination: Secondary | ICD-10-CM | POA: Diagnosis not present

## 2024-12-18 LAB — POCT URINALYSIS DIPSTICK
Bilirubin, UA: NEGATIVE
Blood, UA: POSITIVE
Glucose, UA: NEGATIVE
Ketones, UA: NEGATIVE
Nitrite, UA: NEGATIVE
Protein, UA: NEGATIVE
Spec Grav, UA: 1.01
Urobilinogen, UA: 0.2 U/dL
pH, UA: 6.5

## 2024-12-18 NOTE — Progress Notes (Cosign Needed Addendum)
" ° ° °  NURSE VISIT NOTE  Subjective:    [name], female    DOB: 1990-12-26, 34 y.o.   MRN: [id number]  HPI  Patient is a 34 y.o. G20P0101 female who presents for white and malodorous vaginal discharge for 2 day(s). Denies abnormal vaginal bleeding or fever. admits to dysuria, urinary urgency, and pelvic pain. Patient denies a history of known exposure to STD.   Objective:    LMP 06/06/2024    No results found for any visits on 12/18/24.  Assessment:   1. Vaginal odor   2. Urinary urgency     nonspecific vaginitis  Plan:   GC and chlamydia DNA  probe sent to lab. ROV prn if symptoms persist or worsen.   Mathis LITTIE Getting, CMA  "

## 2024-12-20 ENCOUNTER — Other Ambulatory Visit: Payer: Self-pay | Admitting: Physician Assistant

## 2024-12-20 DIAGNOSIS — F411 Generalized anxiety disorder: Secondary | ICD-10-CM

## 2024-12-20 LAB — URINE CULTURE, OB REFLEX

## 2024-12-20 LAB — CULTURE, OB URINE

## 2024-12-22 LAB — CERVICOVAGINAL ANCILLARY ONLY
Bacterial Vaginitis (gardnerella): NEGATIVE
Candida Glabrata: NEGATIVE
Candida Vaginitis: NEGATIVE
Chlamydia: NEGATIVE
Comment: NEGATIVE
Comment: NEGATIVE
Comment: NEGATIVE
Comment: NEGATIVE
Comment: NEGATIVE
Comment: NORMAL
Neisseria Gonorrhea: NEGATIVE
Trichomonas: NEGATIVE

## 2024-12-23 ENCOUNTER — Other Ambulatory Visit

## 2024-12-23 ENCOUNTER — Ambulatory Visit

## 2024-12-25 ENCOUNTER — Ambulatory Visit
Admission: RE | Admit: 2024-12-25 | Discharge: 2024-12-25 | Disposition: A | Discharge status: Home / Self Care | Payer: Self-pay | Attending: Emergency Medicine

## 2024-12-25 VITALS — BP 115/70 | HR 106 | Temp 98.4°F | Resp 18

## 2024-12-25 DIAGNOSIS — Z3A28 28 weeks gestation of pregnancy: Secondary | ICD-10-CM

## 2024-12-25 DIAGNOSIS — J0100 Acute maxillary sinusitis, unspecified: Secondary | ICD-10-CM | POA: Diagnosis not present

## 2024-12-25 MED ORDER — AMOXICILLIN 875 MG PO TABS: 875.0000 mg | ORAL_TABLET | Freq: Two times a day (BID) | ORAL | 0 refills | Status: AC

## 2024-12-25 NOTE — Discharge Instructions (Addendum)
 Take the amoxicillin  as directed for your sinus infection.  Take over-the-counter medications only as directed by your OB/GYN.  Follow-up with your primary care provider or OB/GYN.  Go to the emergency department if you have worsening symptoms.

## 2024-12-25 NOTE — ED Provider Notes (Signed)
 " CAY RALPH PELT    CSN: [id number] Arrival date & time: 12/25/24  1345      History   Chief Complaint Chief Complaint  Patient presents with   Nasal Congestion    I believe what has started as a cold has went to my sinuses. Now I have major sinus pressure in my head, ears, and underneath my eyes. - Entered by patient    HPI Braniya Farrugia is a 34 y.o. female.  Patient is [redacted] weeks pregnant.  She presents with 6-day history of congestion, postnasal drainage, runny nose, cough.  She reports increased sinus pressure and congestion in the last 2 to 3 days.  She has been treating her symptoms with a humidifier.  No fever, shortness of breath, chest pain, abdominal pain, vomiting, diarrhea, vaginal bleeding.  She reports no concerns with her pregnancy.  The history is provided by the patient and medical records.    Past Medical History:  Diagnosis Date   ADD (attention deficit disorder)    Vitamin D deficiency     Patient Active Problem List   Diagnosis Date Noted   Elevated AFP 10/20/2024   UTI (urinary tract infection) during pregnancy 09/29/2024   Vaginal bleeding affecting early pregnancy 09/04/2024   Hx of preterm delivery, currently pregnant 09/02/2024   Supervision of other normal pregnancy, antepartum 07/27/2024   Atopic dermatitis 07/13/2019   Generalized anxiety disorder 01/18/2019   Psoriasis 01/12/2019   Attention deficit hyperactivity disorder (ADHD), predominantly hyperactive type 12/04/2017    Past Surgical History:  Procedure Laterality Date   BREAST SURGERY     2 lumps removed- neg   COLONOSCOPY WITH PROPOFOL  N/A 04/25/2022   Procedure: COLONOSCOPY WITH PROPOFOL ;  Surgeon: Unk Corinn Skiff, MD;  Location: [facility] ENDOSCOPY;  Service: Gastroenterology;  Laterality: N/A;    OB History     Gravida  2   Para  1   Term      Preterm  1   AB      Living  1      SAB      IAB      Ectopic      Multiple  0   Live Births  1             Home Medications    Prior to Admission medications  Medication Sig Start Date End Date Taking? Authorizing Provider  amoxicillin  (AMOXIL ) 875 MG tablet Take 1 tablet (875 mg total) by mouth 2 (two) times daily for 10 days. 12/25/24 01/04/25 Yes Corlis Burnard DEL, NP  fluticasone  (FLONASE ) 50 MCG/ACT nasal spray SPRAY 2 SPRAYS INTO EACH NOSTRIL EVERY DAY 02/03/24   Khan, Fozia M, MD  hydrocortisone  1 % ointment Apply 1 Application topically 2 (two) times daily. 10/20/24   Slaughterbeck, Damien, CNM  loratadine (CLARITIN) 10 MG tablet Take 10 mg by mouth daily.    [provider]  NON FORMULARY Vaginal probiotic tablet ( one tablet daily)    [provider]  Prenatal Vit-Fe Fumarate-FA (MULTIVITAMIN-PRENATAL) 27-0.8 MG TABS tablet Take 1 tablet by mouth daily at 12 noon.    [provider]  sertraline  (ZOLOFT ) 50 MG tablet TAKE 1 TABLET BY MOUTH EVERY DAY 12/24/24   McDonough, [name], PA-C    Family History Family History  Problem Relation Age of Onset   Ovarian cancer Paternal Grandmother    Atrial fibrillation Father    Breast cancer Neg Hx    Colon cancer Neg Hx  Diabetes Neg Hx     Social History Social History[1]   Allergies   Dust mite mixed allergen ext [mite (d. farinae)], Misc. sulfonamide containing compounds, and Sulfa antibiotics   Review of Systems Review of Systems  Constitutional:  Negative for chills and fever.  HENT:  Positive for congestion, postnasal drip, rhinorrhea and sinus pressure. Negative for ear pain and sore throat.   Respiratory:  Positive for cough. Negative for shortness of breath.   Cardiovascular:  Negative for chest pain and palpitations.  Gastrointestinal:  Negative for abdominal pain, diarrhea and vomiting.     Physical Exam Triage Vital Signs ED Triage Vitals  Encounter Vitals Group     BP 12/25/24 1403 115/70     Girls Systolic BP Percentile --      Girls Diastolic BP Percentile --      Boys Systolic  BP Percentile --      Boys Diastolic BP Percentile --      Pulse Rate 12/25/24 1403 (!) 106     Resp 12/25/24 1403 18     Temp 12/25/24 1403 98.4 F (36.9 C)     Temp Source 12/25/24 1403 Oral     SpO2 12/25/24 1403 98 %     Weight --      Height --      Head Circumference --      Peak Flow --      Pain Score 12/25/24 1401 0     Pain Loc --      Pain Education --      Exclude from Growth Chart --    No data found.  Updated Vital Signs BP 115/70 (BP Location: Right Arm)   Pulse (!) 106   Temp 98.4 F (36.9 C) (Oral)   Resp 18   LMP 06/06/2024   SpO2 98%   Visual Acuity Right Eye Distance:   Left Eye Distance:   Bilateral Distance:    Right Eye Near:   Left Eye Near:    Bilateral Near:     Physical Exam Constitutional:      General: She is not in acute distress. HENT:     Right Ear: Tympanic membrane normal.     Left Ear: Tympanic membrane normal.     Nose: Congestion and rhinorrhea present.     Mouth/Throat:     Mouth: Mucous membranes are moist.     Pharynx: Oropharynx is clear.  Cardiovascular:     Rate and Rhythm: Normal rate and regular rhythm.     Heart sounds: Normal heart sounds.  Pulmonary:     Effort: Pulmonary effort is normal. No respiratory distress.     Breath sounds: Normal breath sounds.  Neurological:     Mental Status: She is alert.      UC Treatments / Results  Labs (all labs ordered are listed, but only abnormal results are displayed) Labs Reviewed - No data to display  EKG   Radiology No results found.  Procedures Procedures (including critical care time)  Medications Ordered in UC Medications - No data to display  Initial Impression / Assessment and Plan / UC Course  I have reviewed the triage vital signs and the nursing notes.  Pertinent labs & imaging results that were available during my care of the patient were reviewed by me and considered in my medical decision making (see chart for details).    Acute  sinusitis, [redacted] weeks pregnant.  Afebrile.  Lungs are clear and O2 sat is 98%  on room air.  Treating sinus infection with amoxicillin .  Instructed patient to take OTC medications only as instructed by her OB/GYN.  Education provided on sinus infection.  Instructed patient to follow-up with her PCP or OB/GYN.  ED precautions given.  She agrees to plan of care.  Final Clinical Impressions(s) / UC Diagnoses   Final diagnoses:  Acute non-recurrent maxillary sinusitis  [redacted] weeks gestation of pregnancy     Discharge Instructions      Take the amoxicillin  as directed for your sinus infection.  Take over-the-counter medications only as directed by your OB/GYN.  Follow-up with your primary care provider or OB/GYN.  Go to the emergency department if you have worsening symptoms.     ED Prescriptions     Medication Sig Dispense Auth. Provider   amoxicillin  (AMOXIL ) 875 MG tablet Take 1 tablet (875 mg total) by mouth 2 (two) times daily for 10 days. 20 tablet Corlis Burnard DEL, NP      PDMP not reviewed this encounter.    [1]  Social History Tobacco Use   Smoking status: Never   Smokeless tobacco: Never  Vaping Use   Vaping status: Never Used  Substance Use Topics   Alcohol use: Not Currently    Comment: ocassionally   Drug use: No     Corlis Burnard DEL, NP 12/25/24 1427  "

## 2024-12-25 NOTE — ED Triage Notes (Signed)
 I believe what has started as a cold has went to my sinuses. Now I have major sinus pressure in my head, ears, and underneath my eyes. - Entered by patient  Pt symptoms have been going on for 6 days. Pt has used her humidifier no OTC meds due to being [redacted] weeks pregnant.

## 2024-12-29 ENCOUNTER — Encounter: Payer: Self-pay | Admitting: Advanced Practice Midwife

## 2024-12-29 ENCOUNTER — Ambulatory Visit: Admitting: Advanced Practice Midwife

## 2024-12-29 VITALS — BP 124/76 | HR 96 | Wt 169.3 lb

## 2024-12-29 DIAGNOSIS — Z3A29 29 weeks gestation of pregnancy: Secondary | ICD-10-CM

## 2024-12-29 DIAGNOSIS — Z3483 Encounter for supervision of other normal pregnancy, third trimester: Secondary | ICD-10-CM

## 2024-12-29 NOTE — Progress Notes (Signed)
 "   Routine Prenatal Care Visit  Subjective  Tina Rodgers is a 34 y.o. G2P0101 at [redacted]w[redacted]d being seen today for ongoing prenatal care.  She is currently monitored for the following issues for this low-risk pregnancy and has Attention deficit hyperactivity disorder (ADHD), predominantly hyperactive type; Psoriasis; Generalized anxiety disorder; Atopic dermatitis; Supervision of other normal pregnancy, antepartum; Hx of preterm delivery, currently pregnant; Vaginal bleeding affecting early pregnancy; UTI (urinary tract infection) during pregnancy; and Elevated AFP on their problem list.  ----------------------------------------------------------------------------------- Patient reports URI and was seen at urgent care last Friday. Given amoxicillin  for sinus infection. Reviewed safe medications and other comfort measures. Advised stay well hydrated.  Contractions: Not present. Vag. Bleeding: None.  Movement: Present. Leaking Fluid denies.  ----------------------------------------------------------------------------------- The following portions of the patient's history were reviewed and updated as appropriate: allergies, current medications, past family history, past medical history, past social history, past surgical history and problem list. Problem list updated.  Objective  BP 124/76   Pulse 96   Wt 169 lb 4.8 oz (76.8 kg)   LMP 06/06/2024   BMI 33.06 kg/m  Pregravid weight 146 lb (66.2 kg) Total Weight Gain 23 lb 4.8 oz (10.6 kg) Urinalysis: Urine Protein    Urine Glucose    Fetal Status: Fetal Heart Rate (bpm): 134 Fundal Height: 30 cm Movement: Present      General:  Alert, oriented and cooperative. Patient is in no acute distress.  Skin: Skin is warm and dry. No rash noted.   Cardiovascular: Normal heart rate noted  Respiratory: Normal respiratory effort, no problems with respiration noted  Abdomen: Soft, gravid, appropriate for gestational age. Pain/Pressure: Absent      Pelvic:  Cervical exam deferred        Extremities: Normal range of motion.  Edema: None  Mental Status: Normal mood and affect. Normal behavior. Normal judgment and thought content.   Assessment   34 y.o. G2P0101 at [redacted]w[redacted]d by  03/13/2025, by Last Menstrual Period presenting for routine prenatal visit  Plan   G2 Problems (from 07/27/24 to present)     Problem Noted Diagnosed Resolved   UTI (urinary tract infection) during pregnancy 09/29/2024 by Jayne Harlene CROME, CNM  No   Overview Signed 09/29/2024  4:30 PM by Jayne Harlene CROME, CNM  E. Faecalis, 09/17/24 TOC [ ]       Hx of preterm delivery, currently pregnant 09/02/2024 by Delinda Jinnie Jansky, CNM  No   Overview Signed 11/17/2024  3:50 PM by Macy Perkins, CNM  Was checked in office at 36 weeks and was found to be 3cm. Rechecked a few minutes later and was then 5cm. She was not feeling contractions. Sent to hospital and was 7cm when she got there.       Supervision of other normal pregnancy, antepartum 07/27/2024 by Tamea Annalee DEL, CMA  No   Overview Addendum 12/29/2024 11:33 AM by Taft Salines, LPN   Clinical Staff Provider  Office Location  [facility] Dating  03/13/2025, Date entered prior to episode creation  Language  English Anatomy US   WNL; Posterior placenta  Flu Vaccine  09/22/24 Genetic Screen  NIPS: Neg;female  TDaP vaccine  Defer to 30 wk d/t illness Hgb A1C or  GTT Early : Not done Third trimester : 105  Covid Has had   LAB RESULTS   Rhogam   09/03/24;12/15/24 Blood Type  A neg  RSV NA Antibody  Negative  Feeding Plan Pump & feed Rubella  Immune  Contraception pills  RPR  Non Reactive  Circumcision Yes  HBsAg  Negative  Pediatrician   peds HIV  Non Reactive  Support Person Husband Varicella  Immune  Prenatal Classes maybe GBS  (For PCN allergy, check sensitivities)     Hep C     BTL Consent NA Pap Diagnosis  Date Value Ref Range Status  05/03/2022   Final   - Negative for  intraepithelial lesion or malignancy (NILM)    VBAC Consent NA Hgb Electro  Negative/Normal    CF      SMA                    Preterm labor symptoms and general obstetric precautions including but not limited to vaginal bleeding, contractions, leaking of fluid and fetal movement were reviewed in detail with the patient. Please refer to After Visit Summary for other counseling recommendations.   Return in about 2 weeks (around 01/12/2025) for rob.  Slater Rains, CNM 12/29/2024 4:23 PM    "

## 2024-12-29 NOTE — Patient Instructions (Signed)
 Third Trimester of Pregnancy  The third trimester of pregnancy is from week 28 through week 40. This is months 7 through 9. The third trimester is a time when your baby is growing fast. Body changes during your third trimester Your body continues to change during this time. The changes usually go away after your baby is born. Physical changes You will continue to gain weight. You may get stretch marks on your hips, belly, and breasts. Your breasts will keep growing and may hurt. A yellow fluid (colostrum) may leak from your breasts. This is the first milk you're making for your baby. Your hair may grow faster and get thicker. In some cases, you may get hair loss. Your belly button may stick out. You may have more swelling in your hands, face, or ankles. Health changes You may have heartburn. You may feel short of breath. This is caused by the uterus that is now bigger. You may have more aches in the pelvis, back, or thighs. You may have more tingling or numbness in your hands, arms, and legs. You may pee more often. You may have trouble pooping (constipation) or swollen veins in the butt that can itch or get painful (hemorrhoids). Other changes You may have more problems sleeping. You may notice the baby moving lower in your belly (dropping). You may have more fluid coming from your vagina. Your joints may feel loose, and you may have pain around your pelvic bone. Follow these instructions at home: Medicines Take medicines only as told by your health care provider. Some medicines are not safe during pregnancy. Your provider may change the medicines that you take. Do not take any medicines unless told to by your provider. Take a prenatal vitamin that has at least 600 micrograms (mcg) of folic acid. Do not use herbal medicines, illegal drugs, or medicines that are not approved by your provider. Eating and drinking While you're pregnant your body needs additional nutrition to help  support your growing baby. Talk with your provider about your nutritional needs. Activity Most women are able to exercise regularly during pregnancy. Exercise routines may need to change at the end of your pregnancy. Talk to your provider about your activities and exercise routine. Relieving pain and discomfort Rest often with your legs raised if you have leg cramps or low back pain. Take warm sitz baths to soothe pain from hemorrhoids. Use hemorrhoid cream if your provider says it's okay. Wear a good, supportive bra if your breasts hurt. Do not use hot tubs, steam rooms, or saunas. Do not douche. Do not use tampons or scented pads. Safety Talk to your provider before traveling far distances. Wear your seatbelt at all times when you're in a car. Talk to your provider if someone hits you, hurts you, or yells at you. Preparing for birth To prepare for your baby: Take childbirth and breastfeeding classes. Visit the hospital and tour the maternity area. Buy a rear-facing car seat. Learn how to install it in your car. General instructions Avoid cat litter boxes and soil used by cats. These things carry germs that can cause harm to your pregnancy and your baby. Do not drink alcohol, smoke, vape, or use products with nicotine or tobacco in them. If you need help quitting, talk with your provider. Keep all follow-up visits for your third trimester. Your provider will do more exams and tests during this trimester. Write down your questions. Take them to your prenatal visits. Your provider also will: Talk with you about  your overall health. Give you advice or refer you to specialists who can help with different needs, including: Mental health and counseling. Foods and healthy eating. Ask for help if you need help with food. Where to find more information American Pregnancy Association: americanpregnancy.org [employer]: acog.org Office on [employer] Health:  [employer] Contact a health care provider if: You have a headache that does not go away when you take medicine. You have any of these problems: You can't eat or drink. You have nausea and vomiting. You have watery poop (diarrhea) for 2 days or more. You have pain when you pee, or your pee smells bad. You have been sick for 2 days or more and aren't getting better. Contact your provider right away if: You have any of these coming from your vagina: Abnormal discharge. Bad-smelling fluid. Bleeding. Your baby is moving less than usual. You have signs of labor: You have any contractions, belly cramping, or have pain in your pelvis or lower back before 37 weeks of pregnancy (preterm labor). You have regular contractions that are less than 5 minutes apart. Your water breaks. You have symptoms of high blood pressure or preeclampsia. These include: A severe, throbbing headache that does not go away. Sudden or extreme swelling of your face, hands, legs, or feet. Vision problems: You see spots. You have blurry vision. Your eyes are sensitive to light. If you can't reach your provider, go to an urgent care or emergency room. Get help right away if: You faint, become confused, or can't think clearly. You have chest pain or trouble breathing. You have any kind of injury, such as from a fall or a car crash. These symptoms may be an emergency. Call 911 right away. Do not wait to see if the symptoms will go away. Do not drive yourself to the hospital. This information is not intended to replace advice given to you by your health care provider. Make sure you discuss any questions you have with your health care provider. Document Revised: 08/22/2023 Document Reviewed: 03/22/2023 Elsevier Patient Education  2024 [employer].

## 2025-01-12 ENCOUNTER — Encounter: Admitting: Registered Nurse

## 2025-02-02 ENCOUNTER — Ambulatory Visit: Admitting: Urology

## 2025-02-04 ENCOUNTER — Ambulatory Visit: Admitting: Urology

## 2025-06-10 ENCOUNTER — Encounter: Admitting: Physician Assistant
